# Patient Record
Sex: Female | Born: 1935 | Race: Asian | Hispanic: No | Marital: Married | Smoking: Never smoker
Health system: Southern US, Community
[De-identification: ages and names within clinical notes are randomized; demographics above are authoritative.]

## PROBLEM LIST (undated history)

## (undated) DIAGNOSIS — E785 Hyperlipidemia, unspecified: Secondary | ICD-10-CM

## (undated) DIAGNOSIS — I2510 Atherosclerotic heart disease of native coronary artery without angina pectoris: Secondary | ICD-10-CM

## (undated) DIAGNOSIS — I5032 Chronic diastolic (congestive) heart failure: Secondary | ICD-10-CM

## (undated) DIAGNOSIS — E669 Obesity, unspecified: Secondary | ICD-10-CM

## (undated) DIAGNOSIS — Z889 Allergy status to unspecified drugs, medicaments and biological substances status: Secondary | ICD-10-CM

## (undated) DIAGNOSIS — Z8673 Personal history of transient ischemic attack (TIA), and cerebral infarction without residual deficits: Secondary | ICD-10-CM

## (undated) DIAGNOSIS — E119 Type 2 diabetes mellitus without complications: Secondary | ICD-10-CM

## (undated) DIAGNOSIS — I214 Non-ST elevation (NSTEMI) myocardial infarction: Secondary | ICD-10-CM

## (undated) DIAGNOSIS — N183 Chronic kidney disease, stage 3 unspecified: Secondary | ICD-10-CM

## (undated) DIAGNOSIS — Z9861 Coronary angioplasty status: Secondary | ICD-10-CM

## (undated) DIAGNOSIS — I10 Essential (primary) hypertension: Secondary | ICD-10-CM

## (undated) DIAGNOSIS — Z794 Long term (current) use of insulin: Secondary | ICD-10-CM

## (undated) DIAGNOSIS — M1990 Unspecified osteoarthritis, unspecified site: Secondary | ICD-10-CM

## (undated) DIAGNOSIS — I6930 Unspecified sequelae of cerebral infarction: Secondary | ICD-10-CM

## (undated) HISTORY — DX: Unspecified osteoarthritis, unspecified site: M19.90

## (undated) HISTORY — DX: Hyperlipidemia, unspecified: E78.5

## (undated) HISTORY — DX: Chronic diastolic (congestive) heart failure: I50.32

## (undated) HISTORY — DX: Type 2 diabetes mellitus without complications: Z79.4

## (undated) HISTORY — DX: Unspecified sequelae of cerebral infarction: I69.30

## (undated) HISTORY — DX: Personal history of transient ischemic attack (TIA), and cerebral infarction without residual deficits: Z86.73

## (undated) HISTORY — DX: Coronary angioplasty status: Z98.61

## (undated) HISTORY — DX: Non-ST elevation (NSTEMI) myocardial infarction: I21.4

## (undated) HISTORY — DX: Type 2 diabetes mellitus without complications: E11.9

## (undated) HISTORY — PX: CHOLECYSTECTOMY: SHX55

## (undated) HISTORY — DX: Obesity, unspecified: E66.9

## (undated) HISTORY — PX: CAROTID STENT: SHX1301

## (undated) HISTORY — DX: Atherosclerotic heart disease of native coronary artery without angina pectoris: I25.10

## (undated) HISTORY — DX: Chronic kidney disease, stage 3 (moderate): N18.3

## (undated) HISTORY — DX: Chronic kidney disease, stage 3 unspecified: N18.30

---

## 2004-03-14 DIAGNOSIS — Z8673 Personal history of transient ischemic attack (TIA), and cerebral infarction without residual deficits: Secondary | ICD-10-CM

## 2005-03-03 ENCOUNTER — Inpatient Hospital Stay (HOSPITAL_COMMUNITY): Admission: EM | Admit: 2005-03-03 | Discharge: 2005-03-05 | Payer: Self-pay | Admitting: Emergency Medicine

## 2005-03-04 ENCOUNTER — Ambulatory Visit: Payer: Self-pay | Admitting: Internal Medicine

## 2005-03-13 ENCOUNTER — Ambulatory Visit (HOSPITAL_COMMUNITY): Admission: RE | Admit: 2005-03-13 | Discharge: 2005-03-13 | Payer: Self-pay | Admitting: *Deleted

## 2005-03-13 ENCOUNTER — Ambulatory Visit: Payer: Self-pay | Admitting: Internal Medicine

## 2005-03-13 ENCOUNTER — Encounter: Payer: Self-pay | Admitting: Cardiovascular Disease

## 2005-03-13 ENCOUNTER — Ambulatory Visit: Payer: Self-pay | Admitting: Cardiovascular Disease

## 2005-03-21 ENCOUNTER — Ambulatory Visit: Payer: Self-pay | Admitting: Internal Medicine

## 2005-03-22 ENCOUNTER — Ambulatory Visit: Payer: Self-pay | Admitting: Pulmonary Disease

## 2005-04-19 ENCOUNTER — Ambulatory Visit: Payer: Self-pay | Admitting: Internal Medicine

## 2009-11-07 ENCOUNTER — Inpatient Hospital Stay (HOSPITAL_COMMUNITY): Admission: EM | Admit: 2009-11-07 | Discharge: 2009-11-09 | Payer: Self-pay | Admitting: Emergency Medicine

## 2009-11-07 DIAGNOSIS — Z8673 Personal history of transient ischemic attack (TIA), and cerebral infarction without residual deficits: Secondary | ICD-10-CM

## 2009-11-07 HISTORY — DX: Personal history of transient ischemic attack (TIA), and cerebral infarction without residual deficits: Z86.73

## 2009-11-08 ENCOUNTER — Encounter (INDEPENDENT_AMBULATORY_CARE_PROVIDER_SITE_OTHER): Payer: Self-pay | Admitting: Internal Medicine

## 2009-11-08 ENCOUNTER — Ambulatory Visit: Payer: Self-pay | Admitting: Vascular Surgery

## 2010-06-23 LAB — URINE CULTURE
Colony Count: >=100000
Culture  Setup Time: 201108020044

## 2010-06-23 LAB — GLUCOSE, CAPILLARY
Glucose-Capillary: 141 mg/dL — ABNORMAL HIGH (ref 70–99)
Glucose-Capillary: 233 mg/dL — ABNORMAL HIGH (ref 70–99)
Glucose-Capillary: 266 mg/dL — ABNORMAL HIGH (ref 70–99)
Glucose-Capillary: 272 mg/dL — ABNORMAL HIGH (ref 70–99)
Glucose-Capillary: 293 mg/dL — ABNORMAL HIGH (ref 70–99)
Glucose-Capillary: 301 mg/dL — ABNORMAL HIGH (ref 70–99)
Glucose-Capillary: 303 mg/dL — ABNORMAL HIGH (ref 70–99)
Glucose-Capillary: 82 mg/dL (ref 70–99)

## 2010-06-23 LAB — DIFFERENTIAL
Basophils Absolute: 0 10*3/uL (ref 0.0–0.1)
Basophils Relative: 0 % (ref 0–1)
Eosinophils Absolute: 0.1 10*3/uL (ref 0.0–0.7)
Eosinophils Relative: 1 % (ref 0–5)
Lymphocytes Relative: 26 % (ref 12–46)
Lymphs Abs: 2.6 10*3/uL (ref 0.7–4.0)
Monocytes Absolute: 0.6 10*3/uL (ref 0.1–1.0)
Monocytes Relative: 6 % (ref 3–12)
Neutro Abs: 6.5 10*3/uL (ref 1.7–7.7)
Neutrophils Relative %: 66 % (ref 43–77)

## 2010-06-23 LAB — POCT I-STAT, CHEM 8
BUN: 51 mg/dL — ABNORMAL HIGH (ref 6–23)
Calcium, Ion: 1.02 mmol/L — ABNORMAL LOW (ref 1.12–1.32)
Chloride: 104 mEq/L (ref 96–112)
Creatinine, Ser: 1.5 mg/dL — ABNORMAL HIGH (ref 0.4–1.2)
Glucose, Bld: 263 mg/dL — ABNORMAL HIGH (ref 70–99)
HCT: 44 % (ref 36.0–46.0)
Hemoglobin: 15 g/dL (ref 12.0–15.0)
Potassium: 5.4 mEq/L — ABNORMAL HIGH (ref 3.5–5.1)
Sodium: 137 mEq/L (ref 135–145)
TCO2: 31 mmol/L (ref 0–100)

## 2010-06-23 LAB — CBC
HCT: 39.5 % (ref 36.0–46.0)
Hemoglobin: 13.7 g/dL (ref 12.0–15.0)
MCH: 31.6 pg (ref 26.0–34.0)
MCHC: 34.7 g/dL (ref 30.0–36.0)
MCV: 91 fL (ref 78.0–100.0)
Platelets: 209 10*3/uL (ref 150–400)
RBC: 4.34 MIL/uL (ref 3.87–5.11)
RDW: 13 % (ref 11.5–15.5)
WBC: 9.8 10*3/uL (ref 4.0–10.5)

## 2010-06-23 LAB — COMPREHENSIVE METABOLIC PANEL
ALT: 59 U/L — ABNORMAL HIGH (ref 0–35)
AST: 54 U/L — ABNORMAL HIGH (ref 0–37)
Albumin: 3.8 g/dL (ref 3.5–5.2)
Alkaline Phosphatase: 76 U/L (ref 39–117)
BUN: 34 mg/dL — ABNORMAL HIGH (ref 6–23)
CO2: 28 mEq/L (ref 19–32)
Calcium: 8.9 mg/dL (ref 8.4–10.5)
Chloride: 102 mEq/L (ref 96–112)
Creatinine, Ser: 1.25 mg/dL — ABNORMAL HIGH (ref 0.4–1.2)
GFR calc Af Amer: 51 mL/min — ABNORMAL LOW (ref >60)
GFR calc non Af Amer: 42 mL/min — ABNORMAL LOW (ref >60)
Glucose, Bld: 299 mg/dL — ABNORMAL HIGH (ref 70–99)
Potassium: 4.4 mEq/L (ref 3.5–5.1)
Sodium: 139 mEq/L (ref 135–145)
Total Bilirubin: 1.2 mg/dL (ref 0.3–1.2)
Total Protein: 7.5 g/dL (ref 6.0–8.3)

## 2010-06-23 LAB — LIPID PANEL
Cholesterol: 232 mg/dL — ABNORMAL HIGH (ref 0–200)
HDL: 50 mg/dL (ref >39)
LDL Cholesterol: 136 mg/dL — ABNORMAL HIGH (ref 0–99)
Total CHOL/HDL Ratio: 4.6 RATIO

## 2010-06-23 LAB — URINE MICROSCOPIC-ADD ON

## 2010-06-23 LAB — BASIC METABOLIC PANEL
BUN: 31 mg/dL — ABNORMAL HIGH (ref 6–23)
BUN: 34 mg/dL — ABNORMAL HIGH (ref 6–23)
Calcium: 8.8 mg/dL (ref 8.4–10.5)
Chloride: 105 mEq/L (ref 96–112)
Creatinine, Ser: 1.12 mg/dL (ref 0.4–1.2)
GFR calc non Af Amer: 48 mL/min — ABNORMAL LOW (ref >60)
Glucose, Bld: 174 mg/dL — ABNORMAL HIGH (ref 70–99)
Glucose, Bld: 233 mg/dL — ABNORMAL HIGH (ref 70–99)
Potassium: 4 mEq/L (ref 3.5–5.1)

## 2010-06-23 LAB — CARDIAC PANEL(CRET KIN+CKTOT+MB+TROPI)
CK, MB: 2.8 ng/mL (ref 0.3–4.0)
CK, MB: 3 ng/mL (ref 0.3–4.0)
Relative Index: 1.3 (ref 0.0–2.5)
Relative Index: 2.2 (ref 0.0–2.5)
Total CK: 139 U/L (ref 7–177)
Troponin I: 0.02 ng/mL (ref 0.00–0.06)

## 2010-06-23 LAB — HEMOGLOBIN A1C
Hgb A1c MFr Bld: 9 % — ABNORMAL HIGH (ref <5.7)
Mean Plasma Glucose: 212 mg/dL — ABNORMAL HIGH (ref <117)

## 2010-06-23 LAB — URINALYSIS, ROUTINE W REFLEX MICROSCOPIC
Bilirubin Urine: NEGATIVE
Glucose, UA: 500 mg/dL — AB
Hgb urine dipstick: NEGATIVE
Ketones, ur: NEGATIVE mg/dL
Nitrite: NEGATIVE
Protein, ur: NEGATIVE mg/dL
Specific Gravity, Urine: 1.021 (ref 1.005–1.030)
Urobilinogen, UA: 0.2 mg/dL (ref 0.0–1.0)
pH: 5.5 (ref 5.0–8.0)

## 2010-06-23 LAB — POCT CARDIAC MARKERS
CKMB, poc: 1.5 ng/mL (ref 1.0–8.0)
Myoglobin, poc: 148 ng/mL (ref 12–200)
Troponin i, poc: <0.05 ng/mL (ref 0.00–0.09)

## 2010-06-23 LAB — APTT: aPTT: 34 seconds (ref 24–37)

## 2010-06-23 LAB — PROTIME-INR
INR: 1.02 (ref 0.00–1.49)
Prothrombin Time: 13.3 seconds (ref 11.6–15.2)

## 2010-08-25 NOTE — H&P (Signed)
NAMEGREIDY, LOGWOOD                   ACCOUNT NO.:  [id number]   MEDICAL RECORD NO.:  [id number]          PATIENT TYPE:  EMS   LOCATION:  MAJO                         FACILITY:  [facility]   PHYSICIAN:  Melissa L. Lovena Le, MD  DATE OF BIRTH:  Mar 23, 1935   DATE OF ADMISSION:  03/03/2005  DATE OF DISCHARGE:                                HISTORY & PHYSICAL   CHIEF COMPLAINT:  Cough with shortness of breath.   HISTORY OF PRESENT ILLNESS:  The patient is a 76 year old Asian female  status post a cold 2 weeks ago, who again presents with increasing  shortness of breath and wheezing for the last week. The patient has had  increasing edema and a headache times several months as well as body aches  that have been relieved by Motrin. Today the patient went to Urgent Care to  be seen because of her increasing shortness of breath and was sent to the  emergency room with the thought that she had congestive heart failure. The  patient does not speak English and therefore the interview was conducted  with her daughter as the Optometrist. In discussing her case with the patient  it seems that she did stop taking her Lasix for the past 2 days after going  down to [place]. She did note increasing edema. The patient has been  complaining of frontal headaches of unclear etiology with blurred vision but  no loss of vision for several months to weeks. She has had no fever, no  chills, but did have some vomiting this morning. The patient also has  noticed increasing growth of the hump on the back of her neck with worsening  shoulder, neck and back pain. The patient denies diarrhea, no melena, no  hematochezia. No dysuria. All other review of systems appear to be negative.   PAST MEDICAL HISTORY:  The patient's daughter states that she had increasing  depressive symptoms over the past couple of weeks to months because of some  personal issues. She has been complaining of frontal headache which has been  continuous  and non relieved with medication. She does have seasonal  allergies. She also is taking a [place] herb the contents of which is  unknown.  Diabetes, and Hypertension.   PAST SURGICAL HISTORY:  Gallbladder removal.   SOCIAL HISTORY:  She denies tobacco and ethanol use.   FAMILY HISTORY:  Mom was deceased in her 22's with no medical condition. Dad  was deceased with diabetes and hypertension.   ALLERGIES:  No known drug allergies.   MEDICATIONS:  (Obtained from [facility], phone number [phone]).  1.  Kay Ciel 10 mEq daily.  2.  Lasix 20 mg daily.  3.  Glucophage/Metformin 5/500 two tablets b.i.d.  4.  Lantus (units however are unclear). Initially they reported that she was      taking 70/30 at 50 units at night and 30 in the morning. However, the      pharmacy relates that she is taking Lantus.  5.  Hyzaar 50/12.5 one p.o. daily.  6.  Folic acid.  7.  Allegra 180 mg daily p.r.n.  8.  Norvasc 10 mg daily.  9.  Lipitor was discontinued back in July (at least not refilled).  10. Again, the patient has been taking a green herbal remedy the contents of      which is unknown.   PHYSICAL EXAMINATION:  VITAL SIGNS:  Temperature was 99.3 on admission, down  to 97.8, blood pressure 150/71, pulse 92, respirations 24 to 28. Saturation  89% on room air, 99% on 2 liters.  GENERAL:  This is a very pleasant [name] in no acute distress.  HEENT:  Normocephalic with a positive moon face and anicteric sclerae.  Pupils are equal, round and reactive to light. Extraocular muscles are  intact. Mucous membranes are moist.  NECK:  Supple, there is no JVD, no lymph nodes and no carotid bruits. No  thyromegaly.  CHEST:  Crackles and wheezes anteriorly at the end of expiration.  CARDIOVASCULAR:  Regular rate and rhythm, positive S1, S2, no S3, S4. Heart  sounds are distant with no murmurs, rubs, or gallops.  ABDOMEN:  Obese, nontender with a question of hepatomegaly. No splenomegaly  is  appreciated.  EXTREMITIES:  Show positive non pitting edema in both lower extremities. She  has 2+ pulses.  NEUROLOGIC:  She is nonfocal. Power is 5/5. Deep tendon reflexes are 2+.  Cranial nerves II-XII are intact.   LABORATORY DATA:  Sodium is 139, potassium 4.3, chloride 107, CO2 is 29, BUN  is 13, creatinine 0.8, glucose is 237. Her white count is 10.1 thousand,  hemoglobin 11.7, hematocrit 35.2, platelets of 283,000. Point of care  enzymes negative x1. BNP is 82.   X-ray shows right upper lobe densities, no infiltrate, no pulmonary edema.   ASSESSMENT/PLAN:  This is a 76 year old [name] with cough, wheezing,  and dyspnea. Also complaining of headaches for several months and increased  swelling. The patient has not been taking her Lasix which may explain  increased edema, but we will need further work up to be sure that this is  not related to her other symptoms of a headache. The patient does have a  possible constellation of symptoms consistent with Cushing's disease, namely  headache, buffalo hump, and moon facies.   PROBLEM 1. Cardiovascular. No chest pain, paroxysmal nocturnal dyspnea or  orthopnea. She has a history of hypertension with positive peripheral edema  without evidence for congestive heart failure. We will check an  echocardiogram to assess ejection fraction and valvular disease as well as  her right ventricle. Will continue her Norvasc and continue Lasix at IV  q.12h. I will hold her hydrochlorothiazide.   PROBLEM 2.  She does have some lung nodules of question. She has no history  of tuberculosis or tuberculosis exposure. Will plan to do a chest CT. Use  nebulizers, a flutter valve and antibiotics at this time. Will use Avelox  400 mg IV daily and convert it to oral in the next day or so. Will use  supplemental oxygen.   PROBLEM 3. GI. She has increasing abdominal girth of unclear etiology. Will check her liver function tests, do an ultrasound of her  liver, CT of her  abdomen when she has her pulmonary/chest CT done in order to assess her  adrenals. We will not need oral contrast at this time.   PROBLEM 4.  GU. Will check an ultrasound of her kidneys and complete  abdominal ultrasound to assess the increasing abdominal girth. Need to check  a urinalysis  and C&S to assess for infection as well as proteinuria.   PROBLEM 5.  Endocrine. The patient does appear to have a constellation of  symptoms consistent with Cushing's, buffalo hump, moon facies and headaches.  Will check a CT of head to rule out adenoma, and check a 24 hour urine.  Cortisol, etc. For diabetes we need to check on her Lantus dosing. If it is  unclear what her true dosing is, for now I  will continue Lantus at 10 q.h.s., sliding scale Insulin at resistant  levels, and Glyburide. I will hold her Metformin because of the CT of the  chest and resume that in 2 days. \   PROBLEM 6.  Deep venous thrombosis prophylaxis with Lovenox.      Melissa L. Lovena Le, MD  Electronically Signed     MLT/MEDQ  D:  03/03/2005  T:  03/03/2005  Job:  [id number]   cc:   Jacelyn Pi, M.D.  Fax: [phone]

## 2010-08-25 NOTE — Consult Note (Signed)
Danielle [name], Danielle [name]                   ACCOUNT NO.:  [id number]   MEDICAL RECORD NO.:  [id number]          PATIENT TYPE:  INP   LOCATION:  5731                         FACILITY:  [facility]   PHYSICIAN:  Legrand Como B. Melvyn Novas, M.D. [facility] OF BIRTH:  [birth year]   DATE OF CONSULTATION:  03/04/2005  DATE OF DISCHARGE:                                   CONSULTATION   CHIEF COMPLAINT:  Cough.   HISTORY:  Exceptionally challenging 76 year old [name] who has lived  in this country for [place] years but does not speak a word of [place].  She  reports multiple aches and pains all over her body, neck, shoulders, and  head for months if not years, already evaluated she says by her doctor at  [facility], who she cannot name, which included a chest x-ray.  She came in with increasing symptoms of new dry cough and dyspnea over the  last two weeks and feels 100% better after receiving prednisone overnight  last night.  She also reported increasing fluid retention and was felt to  have possible heart failure on x-ray and by history and given Lasix and  feels all better now including resolution of edema.   She denies any sputum production, documented fever although feels feverish  with this illness.  She denies any soaking sweats, excess sputum production,  active sinus or reflux symptoms.  She denies any exertional chest pain or  classic pleuritic pain.   PAST MEDICAL HISTORY:  Significant for:  1.  Depression.  2.  Headache.  3.  Seasonal allergies.  4.  Diabetes.  5.  Hypertension.  6.  Remote gallbladder surgery.   SOCIAL HISTORY:  She had never smoked.  Denies any alcohol use.   FAMILY HISTORY:  Negative for respiratory diseases or rheumatologic diseases  to her knowledge.   ALLERGIES:  None known.   MEDICATIONS AT HOME:  Potassium, Lasix, glyburide/metformin, Lantus, Hyzaar,  folic acid, Allegra, Norvasc, and Lipitor although note that she is not  consistent with any of these  medicines.   REVIEW OF SYSTEMS:  Taken in detail as much as possible given the language  barrier and are negative except as outlined above.   PHYSICAL EXAMINATION:   REVIEW OF SYSTEMS:  GENERAL:  To correct the present illness, she has  apparently been losing weight; unclear of the amount and admits that she, in  retrospect she has not her appetite for at least a few weeks.   PHYSICAL EXAMINATION:  GENERAL:  This is a pleasant, [name]  surrounded by perhaps 10 family members, in no acute distress on oxygen.  VITAL SIGNS:  She is afebrile.  Normal vital signs.  She does have slightly  moon facies.  HEENT:  Other unremarkable.  Pharynx clear.  Dentition is intact.  NECK:  Supple without cervical adenopathy or tenderness.  Trachea is  midline.  LUNGS:  Lung fields reveal a few inspiratory pops and squeaks, minimal  expiratory rhonchi.  Overall air movement was adequate.  HEART:  Regular rate and rhythm without murmur, gallop or rub.  No increase  in PT or displacement of PMI.  ABDOMEN:  Obese benign with no palpable organomegaly, masses or tenderness.  EXTREMITIES:  Warm without calf tenderness or cyanosis or clubbing.  There  was no pitting edema today although she was noted to have pitting edema on  admission.  No obvious rash or joint abnormalities.   LABORATORY DATA:  Sed rate of 65 with a BNP of 82.  CBC was unremarkable  with no eosinophil count noted.  Urinalysis was unremarkable.  Chest x-ray and CT scan suggest bilateral interstitial disease, most severe  in the left upper lobe with nodular features.   IMPRESSION:  Atypical pulmonary infiltrates which may or may not have  anything to do with her acute illness of cough associated with complaint of  increased swelling but note the absence of evidence of heart failure by  BNP or exam.  On __________, she is feeling better after a single dose of  Lasix, albeit with steroids.   Given the language barrier, I do not  believe I am going to be able to narrow  the differential diagnosis at this point with the differential diagnoses  including rheumatologic lung disease, MAI, malignancy, but being very  unlikely that it could be related to either typical tuberculosis or acute  pneumonia.   Her aching all over for months that is better with steroids is strongly  suggestive of collagen vascular disorder especially polymyalgia rheumatica  (which does not usually involve the lung like this), rheumatoid arthritis  lupus.  We need to screen for these as well as eosinophilic lung disease.  Eosinophil count is pending but note the patient is already on steroids.   Most important aspect of this patient will be following her serially and  deciding whether a transbronchial biopsy or open lung biopsy be necessary  depending on the results of the above studies.  Plus looking at old records  including x-rays when available.  She has had multiple work-up over the last  year for her aching all over.  I will be interested to see what shows up.   RECOMMENDATIONS:  1.  In terms of discharge planning, she is already feeling so much better      that I suspect she is adequately saturated on room air or soon will be      and can be followed as an outpatient.  2.  In terms of her complaints of swelling all over, I would stop Norvasc      since it tends to cause fluid retention and treat her aggressively with      diuretics and ARB therapy, but avoid ACE inhibitors since her chief      complaint at this point is a dry cough.   Followup will be in the office within a week of discharge with all records  and x-rays when available          ______________________________  Christena Deem. Melvyn Novas, M.D. [facility]    MBW/MEDQ  D:  03/04/2005  T:  03/05/2005  Job:  [id number]   cc:   Sadie Haber at [employer] site

## 2010-09-20 ENCOUNTER — Ambulatory Visit (INDEPENDENT_AMBULATORY_CARE_PROVIDER_SITE_OTHER): Payer: Medicare Other | Admitting: Family Medicine

## 2010-09-20 ENCOUNTER — Encounter: Payer: Self-pay | Admitting: Family Medicine

## 2010-09-20 VITALS — BP 123/73 | Ht <= 58 in | Wt 180.0 lb

## 2010-09-20 DIAGNOSIS — M25561 Pain in right knee: Secondary | ICD-10-CM

## 2010-09-20 DIAGNOSIS — M1710 Unilateral primary osteoarthritis, unspecified knee: Secondary | ICD-10-CM

## 2010-09-20 DIAGNOSIS — M179 Osteoarthritis of knee, unspecified: Secondary | ICD-10-CM | POA: Insufficient documentation

## 2010-09-20 DIAGNOSIS — M25562 Pain in left knee: Secondary | ICD-10-CM | POA: Insufficient documentation

## 2010-09-20 DIAGNOSIS — M25569 Pain in unspecified knee: Secondary | ICD-10-CM

## 2010-09-20 NOTE — Progress Notes (Signed)
  Subjective:    Patient ID: Danielle [name], female    DOB: [birth year], 76 y.o.   MRN: [id number]  HPI 76 year old new patient [name] here for evaluation of left greater than right knee pain. She is the mother of a woman who is friends with [place].  Patient does not speak any illicit, but her daughter speaks good English and her husband who is with her speaks some period she has had gradually increasing left greater than right knee pain for the last 9 months. It is dull aching quality that is worse with activity and better with rest. She also gets an occasional weakness and mild instability. She was given a trial of a patellofemoral brace, but she has much difficulty using this. She had some x-rays done in [place] within the past year that she is brought for me to review. She'll occasionally get some swelling. Currently she is doing Celebrex and some sort of topical ointment.  PMH: DM, HTN Social: [place], now living in [place]. Non smoker  Review of Systems Denies fever, sweats, chills, weight loss. Positive for history of neuropathy because of her diabetes. Denies current chest pain the    Objective:   Physical Exam Gen. appearance: Overweight short [name] no distress Psych: Normal pleasant affect Neck: Supple ENT: Moist mucous membranes Left knee: Mild to moderate medial joint line tenderness to palpation. Range of motion 0-115. Positive crepitus. Ligaments are intact. Trace effusion. Right knee: Minimal medial joint line tenderness to palpation, but with mild crepitus. Range of motion 0-125. No effusion. Ligaments appear intact. Lower extremities: 1-2+ pitting edema bilaterally Feet: Fairly good foot structure without significant pes planus or pes cavus and no significant pronation with gait  Radiographs: 4 views of the bilateral knees were reviewed from a CVA. On her right knee she has medial compartment moderate DJD and moderate patellofemoral DJD with spurring. There is no obvious  evidence of infectious or malignancy. Left knee shows medial compartment moderate DJD as well as some moderate patellofemoral DJD, with multiple areas of spurring.       Assessment & Plan:  Left greater than right knee pain most consistent with degenerative joint disease -She declined a cortisone shot today -[facility] 2 times per week for 4 weeks -Continue Celebrex with food daily as needed. Also recommended Tylenol Extra Strength 4 times daily and trial of glucosamine. -We have her temporary green orthotics to help with cushion, she tried this prior to leaving and liked the way they felt -Also wrote prescriptions for a Velcro knee brace that'll be easier for her to handle as well as a fold up wheelchair that she can use for longer trips with her family -Followup in one month, if no better at that point can consider cortisone shot

## 2010-10-30 ENCOUNTER — Telehealth: Payer: Self-pay | Admitting: *Deleted

## 2010-10-30 NOTE — Telephone Encounter (Signed)
Message copied by Ocie Bob on Mon Oct 30, 2010  3:21 PM ------      Message from: CERESI, [place] L      Created: Mon Oct 30, 2010  8:17 AM      Regarding: pt request      Contact: [phone]       Per Juliann Pulse ([facility]) left a message stating she needs an order to continue PT through the 2nd week of August at 2 visits per week.  Please call her with any questions and the verbal order.

## 2010-10-30 NOTE — Telephone Encounter (Signed)
Authorized 2 visits per week through 2nd weak of August.  Per [facility] patient is making progress, they are working on HEP for after d/c and trying to set up times with another agency to see patient in the home after [facility] PT has d/c'd patient.

## 2011-02-08 DIAGNOSIS — I2510 Atherosclerotic heart disease of native coronary artery without angina pectoris: Secondary | ICD-10-CM

## 2011-02-08 HISTORY — DX: Atherosclerotic heart disease of native coronary artery without angina pectoris: I25.10

## 2011-03-06 ENCOUNTER — Other Ambulatory Visit: Payer: Self-pay

## 2011-03-06 ENCOUNTER — Encounter (HOSPITAL_COMMUNITY): Payer: Self-pay | Admitting: Emergency Medicine

## 2011-03-06 ENCOUNTER — Inpatient Hospital Stay (HOSPITAL_COMMUNITY)
Admission: EM | Admit: 2011-03-06 | Discharge: 2011-03-17 | DRG: 246 | Disposition: A | Discharge status: Home Health | Payer: Medicare Other | Attending: Cardiology | Admitting: Cardiology

## 2011-03-06 ENCOUNTER — Emergency Department (HOSPITAL_COMMUNITY): Payer: Medicare Other

## 2011-03-06 DIAGNOSIS — I2510 Atherosclerotic heart disease of native coronary artery without angina pectoris: Secondary | ICD-10-CM | POA: Diagnosis present

## 2011-03-06 DIAGNOSIS — I129 Hypertensive chronic kidney disease with stage 1 through stage 4 chronic kidney disease, or unspecified chronic kidney disease: Secondary | ICD-10-CM | POA: Diagnosis present

## 2011-03-06 DIAGNOSIS — I214 Non-ST elevation (NSTEMI) myocardial infarction: Secondary | ICD-10-CM

## 2011-03-06 DIAGNOSIS — N183 Chronic kidney disease, stage 3 unspecified: Secondary | ICD-10-CM | POA: Diagnosis present

## 2011-03-06 DIAGNOSIS — I5033 Acute on chronic diastolic (congestive) heart failure: Secondary | ICD-10-CM | POA: Diagnosis present

## 2011-03-06 DIAGNOSIS — I10 Essential (primary) hypertension: Secondary | ICD-10-CM | POA: Diagnosis not present

## 2011-03-06 DIAGNOSIS — E119 Type 2 diabetes mellitus without complications: Secondary | ICD-10-CM | POA: Diagnosis present

## 2011-03-06 DIAGNOSIS — E785 Hyperlipidemia, unspecified: Secondary | ICD-10-CM | POA: Diagnosis present

## 2011-03-06 DIAGNOSIS — N2889 Other specified disorders of kidney and ureter: Secondary | ICD-10-CM | POA: Diagnosis present

## 2011-03-06 DIAGNOSIS — I639 Cerebral infarction, unspecified: Secondary | ICD-10-CM

## 2011-03-06 DIAGNOSIS — Z9861 Coronary angioplasty status: Secondary | ICD-10-CM | POA: Diagnosis present

## 2011-03-06 DIAGNOSIS — E876 Hypokalemia: Secondary | ICD-10-CM | POA: Diagnosis not present

## 2011-03-06 DIAGNOSIS — J9600 Acute respiratory failure, unspecified whether with hypoxia or hypercapnia: Secondary | ICD-10-CM | POA: Diagnosis not present

## 2011-03-06 DIAGNOSIS — K5900 Constipation, unspecified: Secondary | ICD-10-CM | POA: Diagnosis present

## 2011-03-06 DIAGNOSIS — J189 Pneumonia, unspecified organism: Secondary | ICD-10-CM | POA: Diagnosis present

## 2011-03-06 DIAGNOSIS — J9691 Respiratory failure, unspecified with hypoxia: Secondary | ICD-10-CM | POA: Diagnosis present

## 2011-03-06 DIAGNOSIS — M1710 Unilateral primary osteoarthritis, unspecified knee: Secondary | ICD-10-CM | POA: Diagnosis present

## 2011-03-06 DIAGNOSIS — I5032 Chronic diastolic (congestive) heart failure: Secondary | ICD-10-CM | POA: Diagnosis present

## 2011-03-06 DIAGNOSIS — Z8673 Personal history of transient ischemic attack (TIA), and cerebral infarction without residual deficits: Secondary | ICD-10-CM | POA: Diagnosis present

## 2011-03-06 DIAGNOSIS — I509 Heart failure, unspecified: Secondary | ICD-10-CM

## 2011-03-06 DIAGNOSIS — N390 Urinary tract infection, site not specified: Secondary | ICD-10-CM | POA: Clinically undetermined

## 2011-03-06 DIAGNOSIS — E669 Obesity, unspecified: Secondary | ICD-10-CM | POA: Diagnosis present

## 2011-03-06 DIAGNOSIS — I739 Peripheral vascular disease, unspecified: Secondary | ICD-10-CM | POA: Diagnosis present

## 2011-03-06 HISTORY — DX: Allergy status to unspecified drugs, medicaments and biological substances: Z88.9

## 2011-03-06 HISTORY — DX: Essential (primary) hypertension: I10

## 2011-03-06 HISTORY — DX: Non-ST elevation (NSTEMI) myocardial infarction: I21.4

## 2011-03-06 LAB — CBC
HCT: 30.2 % — ABNORMAL LOW (ref 36.0–46.0)
Hemoglobin: 11 g/dL — ABNORMAL LOW (ref 12.0–15.0)
Hemoglobin: 9.5 g/dL — ABNORMAL LOW (ref 12.0–15.0)
MCH: 30.6 pg (ref 26.0–34.0)
MCH: 29.8 pg (ref 26.0–34.0)
MCHC: 32.3 g/dL (ref 30.0–36.0)
MCHC: 31.5 g/dL (ref 30.0–36.0)
MCV: 94.7 fL (ref 78.0–100.0)
Platelets: 146 10*3/uL — ABNORMAL LOW (ref 150–400)
Platelets: 145 10*3/uL — ABNORMAL LOW (ref 150–400)
RBC: 3.59 MIL/uL — ABNORMAL LOW (ref 3.87–5.11)
RBC: 3.19 MIL/uL — ABNORMAL LOW (ref 3.87–5.11)
RDW: 13 % (ref 11.5–15.5)
WBC: 15.9 10*3/uL — ABNORMAL HIGH (ref 4.0–10.5)

## 2011-03-06 LAB — URINE MICROSCOPIC-ADD ON

## 2011-03-06 LAB — DIFFERENTIAL
Basophils Absolute: 0 10*3/uL (ref 0.0–0.1)
Basophils Relative: 0 % (ref 0–1)
Basophils Relative: 0 % (ref 0–1)
Eosinophils Absolute: 0 10*3/uL (ref 0.0–0.7)
Eosinophils Absolute: 0 10*3/uL (ref 0.0–0.7)
Eosinophils Relative: 0 % (ref 0–5)
Lymphocytes Relative: 8 % — ABNORMAL LOW (ref 12–46)
Lymphs Abs: 1.4 10*3/uL (ref 0.7–4.0)
Lymphs Abs: 1.3 10*3/uL (ref 0.7–4.0)
Monocytes Absolute: 0.6 10*3/uL (ref 0.1–1.0)
Monocytes Relative: 3 % (ref 3–12)
Monocytes Relative: 4 % (ref 3–12)
Neutro Abs: 15.8 10*3/uL — ABNORMAL HIGH (ref 1.7–7.7)
Neutro Abs: 14 10*3/uL — ABNORMAL HIGH (ref 1.7–7.7)
Neutrophils Relative %: 89 % — ABNORMAL HIGH (ref 43–77)
Neutrophils Relative %: 88 % — ABNORMAL HIGH (ref 43–77)

## 2011-03-06 LAB — COMPREHENSIVE METABOLIC PANEL
ALT: 16 U/L (ref 0–35)
AST: 57 U/L — ABNORMAL HIGH (ref 0–37)
Albumin: 3.3 g/dL — ABNORMAL LOW (ref 3.5–5.2)
Alkaline Phosphatase: 71 U/L (ref 39–117)
BUN: 23 mg/dL (ref 6–23)
CO2: 25 mEq/L (ref 19–32)
Calcium: 8.5 mg/dL (ref 8.4–10.5)
Chloride: 101 mEq/L (ref 96–112)
Creatinine, Ser: 1.44 mg/dL — ABNORMAL HIGH (ref 0.50–1.10)
GFR calc Af Amer: 40 mL/min — ABNORMAL LOW (ref >90)
GFR calc non Af Amer: 35 mL/min — ABNORMAL LOW (ref >90)
Glucose, Bld: 227 mg/dL — ABNORMAL HIGH (ref 70–99)
Potassium: 4.1 mEq/L (ref 3.5–5.1)
Sodium: 138 mEq/L (ref 135–145)
Total Bilirubin: 0.9 mg/dL (ref 0.3–1.2)
Total Protein: 7 g/dL (ref 6.0–8.3)

## 2011-03-06 LAB — POCT I-STAT, CHEM 8
BUN: 23 mg/dL (ref 6–23)
Calcium, Ion: 1.16 mmol/L (ref 1.12–1.32)
Creatinine, Ser: 1.5 mg/dL — ABNORMAL HIGH (ref 0.50–1.10)
Glucose, Bld: 178 mg/dL — ABNORMAL HIGH (ref 70–99)
Hemoglobin: 11.9 g/dL — ABNORMAL LOW (ref 12.0–15.0)
Sodium: 144 mEq/L (ref 135–145)
TCO2: 22 mmol/L (ref 0–100)

## 2011-03-06 LAB — GLUCOSE, CAPILLARY

## 2011-03-06 LAB — BLOOD GAS, ARTERIAL
Acid-Base Excess: 0.1 mmol/L (ref 0.0–2.0)
Drawn by: 244901
FIO2: 1 %
O2 Saturation: 98 %

## 2011-03-06 LAB — URINALYSIS, ROUTINE W REFLEX MICROSCOPIC
Glucose, UA: NEGATIVE mg/dL
Hgb urine dipstick: NEGATIVE
pH: 8.5 — ABNORMAL HIGH (ref 5.0–8.0)

## 2011-03-06 LAB — HEPARIN LEVEL (UNFRACTIONATED)
Heparin Unfractionated: >2 IU/mL — ABNORMAL HIGH (ref 0.30–0.70)
Heparin Unfractionated: 0.44 IU/mL (ref 0.30–0.70)

## 2011-03-06 LAB — CARDIAC PANEL(CRET KIN+CKTOT+MB+TROPI)
CK, MB: 12.9 ng/mL (ref 0.3–4.0)
CK, MB: 41.6 ng/mL (ref 0.3–4.0)
Relative Index: 7.1 — ABNORMAL HIGH (ref 0.0–2.5)
Total CK: 265 U/L — ABNORMAL HIGH (ref 7–177)
Total CK: 582 U/L — ABNORMAL HIGH (ref 7–177)
Troponin I: 1.62 ng/mL (ref <0.30)
Troponin I: 16.55 ng/mL (ref <0.30)

## 2011-03-06 LAB — MAGNESIUM: Magnesium: 1.7 mg/dL (ref 1.5–2.5)

## 2011-03-06 LAB — PROTIME-INR
INR: 1.25 (ref 0.00–1.49)
Prothrombin Time: 14.4 seconds (ref 11.6–15.2)
Prothrombin Time: 16 seconds — ABNORMAL HIGH (ref 11.6–15.2)

## 2011-03-06 LAB — POCT I-STAT TROPONIN I

## 2011-03-06 MED ORDER — HEPARIN BOLUS VIA INFUSION: 4000.0000 [IU] | Freq: Once | INTRAVENOUS | Status: DC

## 2011-03-06 MED ORDER — OLMESARTAN MEDOXOMIL 20 MG PO TABS
20.0000 mg | ORAL_TABLET | Freq: Every day | ORAL | Status: DC
  Administered 2011-03-06 – 2011-03-08 (×3): 20 mg via ORAL
  Filled 2011-03-06 (×3): qty 1

## 2011-03-06 MED ORDER — DOCUSATE SODIUM 100 MG PO CAPS
100.0000 mg | ORAL_CAPSULE | Freq: Two times a day (BID) | ORAL | Status: DC | PRN
  Administered 2011-03-08: 100 mg via ORAL
  Filled 2011-03-06: qty 1

## 2011-03-06 MED ORDER — SIMVASTATIN 20 MG PO TABS
20.0000 mg | ORAL_TABLET | Freq: Every day | ORAL | Status: DC
  Administered 2011-03-06 – 2011-03-16 (×12): 20 mg via ORAL
  Filled 2011-03-06 (×13): qty 1

## 2011-03-06 MED ORDER — INSULIN ASPART 100 UNIT/ML ~~LOC~~ SOLN
0.0000 [IU] | Freq: Every day | SUBCUTANEOUS | Status: DC
  Administered 2011-03-06: 2 [IU] via SUBCUTANEOUS
  Administered 2011-03-07: 0 [IU] via SUBCUTANEOUS
  Administered 2011-03-11: 4 [IU] via SUBCUTANEOUS
  Administered 2011-03-12: 3 [IU] via SUBCUTANEOUS

## 2011-03-06 MED ORDER — LIDOCAINE HCL 1 % IJ SOLN
INTRAMUSCULAR | Status: AC
  Administered 2011-03-06: 2.1 mL via INTRAMUSCULAR
  Filled 2011-03-06: qty 20

## 2011-03-06 MED ORDER — INSULIN ASPART 100 UNIT/ML ~~LOC~~ SOLN
0.0000 [IU] | Freq: Three times a day (TID) | SUBCUTANEOUS | Status: DC
  Administered 2011-03-07: 2 [IU] via SUBCUTANEOUS
  Administered 2011-03-07: 3 [IU] via SUBCUTANEOUS
  Administered 2011-03-07 – 2011-03-08 (×2): 5 [IU] via SUBCUTANEOUS
  Administered 2011-03-08: 8 [IU] via SUBCUTANEOUS
  Administered 2011-03-08: 5 [IU] via SUBCUTANEOUS
  Administered 2011-03-09 (×2): 3 [IU] via SUBCUTANEOUS
  Administered 2011-03-09: 8 [IU] via SUBCUTANEOUS
  Administered 2011-03-10: 7 [IU] via SUBCUTANEOUS
  Administered 2011-03-10: 5 [IU] via SUBCUTANEOUS
  Administered 2011-03-10: 3 [IU] via SUBCUTANEOUS
  Administered 2011-03-11: 7 [IU] via SUBCUTANEOUS
  Administered 2011-03-11 (×2): 5 [IU] via SUBCUTANEOUS
  Filled 2011-03-06 (×2): qty 3

## 2011-03-06 MED ORDER — HEPARIN BOLUS VIA INFUSION
4000.0000 [IU] | Freq: Once | INTRAVENOUS | Status: AC
  Administered 2011-03-06: 4000 [IU] via INTRAVENOUS
  Filled 2011-03-06: qty 4000

## 2011-03-06 MED ORDER — NITROGLYCERIN IN D5W 200-5 MCG/ML-% IV SOLN
INTRAVENOUS | Status: AC
  Administered 2011-03-06: 50000 ug via INTRAVENOUS
  Filled 2011-03-06: qty 250

## 2011-03-06 MED ORDER — LEVALBUTEROL HCL 0.63 MG/3ML IN NEBU
0.6300 mg | INHALATION_SOLUTION | RESPIRATORY_TRACT | Status: DC | PRN
  Filled 2011-03-06: qty 3

## 2011-03-06 MED ORDER — FUROSEMIDE 10 MG/ML IJ SOLN
40.0000 mg | Freq: Once | INTRAMUSCULAR | Status: AC
  Administered 2011-03-06: 40 mg via INTRAVENOUS
  Filled 2011-03-06: qty 4

## 2011-03-06 MED ORDER — METOPROLOL TARTRATE 12.5 MG HALF TABLET
12.5000 mg | ORAL_TABLET | Freq: Two times a day (BID) | ORAL | Status: DC
  Administered 2011-03-06 – 2011-03-17 (×22): 12.5 mg via ORAL
  Filled 2011-03-06 (×23): qty 1

## 2011-03-06 MED ORDER — MORPHINE SULFATE 4 MG/ML IJ SOLN
4.0000 mg | INTRAMUSCULAR | Status: DC | PRN
Start: 2011-03-06 — End: 2011-03-08
  Administered 2011-03-06: 2 mg via INTRAVENOUS
  Administered 2011-03-08: 4 mg via INTRAVENOUS
  Filled 2011-03-06 (×2): qty 1

## 2011-03-06 MED ORDER — HEPARIN SOD (PORCINE) IN D5W 100 UNIT/ML IV SOLN
1000.0000 [IU]/h | INTRAVENOUS | Status: DC
  Administered 2011-03-06 – 2011-03-10 (×5): 1000 [IU]/h via INTRAVENOUS
  Filled 2011-03-06 (×8): qty 250

## 2011-03-06 MED ORDER — ZOLPIDEM TARTRATE 5 MG PO TABS: 5.0000 mg | ORAL_TABLET | Freq: Every evening | ORAL | Status: DC | PRN

## 2011-03-06 MED ORDER — NITROGLYCERIN 0.4 MG SL SUBL
0.4000 mg | SUBLINGUAL_TABLET | SUBLINGUAL | Status: DC | PRN
  Administered 2011-03-06: 0.4 mg via SUBLINGUAL

## 2011-03-06 MED ORDER — NITROGLYCERIN 0.4 MG SL SUBL: 0.4000 mg | SUBLINGUAL_TABLET | SUBLINGUAL | Status: DC | PRN

## 2011-03-06 MED ORDER — CLOPIDOGREL BISULFATE 75 MG PO TABS
75.0000 mg | ORAL_TABLET | Freq: Every day | ORAL | Status: DC
  Administered 2011-03-06 – 2011-03-12 (×7): 75 mg via ORAL
  Filled 2011-03-06 (×7): qty 1

## 2011-03-06 MED ORDER — HEPARIN (PORCINE) IN NACL 100-0.45 UNIT/ML-% IJ SOLN: 1000.0000 [IU]/h | INTRAMUSCULAR | Status: DC

## 2011-03-06 MED ORDER — ASPIRIN EC 81 MG PO TBEC
81.0000 mg | DELAYED_RELEASE_TABLET | Freq: Every day | ORAL | Status: DC
  Administered 2011-03-07 – 2011-03-12 (×6): 81 mg via ORAL
  Filled 2011-03-06 (×6): qty 1

## 2011-03-06 MED ORDER — ASPIRIN 81 MG PO CHEW: 324.0000 mg | CHEWABLE_TABLET | ORAL | Status: DC

## 2011-03-06 MED ORDER — MORPHINE SULFATE 2 MG/ML IJ SOLN
2.0000 mg | INTRAMUSCULAR | Status: DC | PRN
Start: 2011-03-06 — End: 2011-03-17
  Administered 2011-03-08: 2 mg via INTRAVENOUS
  Filled 2011-03-06: qty 1

## 2011-03-06 MED ORDER — SODIUM CHLORIDE 0.9 % IJ SOLN: 3.0000 mL | INTRAMUSCULAR | Status: DC | PRN

## 2011-03-06 MED ORDER — FUROSEMIDE 10 MG/ML IJ SOLN
80.0000 mg | Freq: Once | INTRAMUSCULAR | Status: AC
  Administered 2011-03-06: 80 mg via INTRAVENOUS
  Filled 2011-03-06: qty 8

## 2011-03-06 MED ORDER — CEFTRIAXONE SODIUM 1 G IJ SOLR
1.0000 g | INTRAMUSCULAR | Status: DC
  Administered 2011-03-06 – 2011-03-07 (×2): 1 g via INTRAMUSCULAR
  Filled 2011-03-06 (×2): qty 10

## 2011-03-06 MED ORDER — PANTOPRAZOLE SODIUM 40 MG IV SOLR
40.0000 mg | INTRAVENOUS | Status: DC
  Administered 2011-03-06 – 2011-03-08 (×3): 40 mg via INTRAVENOUS
  Filled 2011-03-06 (×4): qty 40

## 2011-03-06 MED ORDER — FUROSEMIDE 10 MG/ML IJ SOLN
80.0000 mg | Freq: Two times a day (BID) | INTRAMUSCULAR | Status: DC
  Administered 2011-03-06 – 2011-03-08 (×4): 80 mg via INTRAVENOUS
  Filled 2011-03-06 (×4): qty 8
  Filled 2011-03-06: qty 4
  Filled 2011-03-06 (×4): qty 8
  Filled 2011-03-06: qty 4

## 2011-03-06 MED ORDER — NITROGLYCERIN IN D5W 200-5 MCG/ML-% IV SOLN
2.0000 ug/min | INTRAVENOUS | Status: DC
  Administered 2011-03-06: 50000 ug via INTRAVENOUS
  Administered 2011-03-09: 10 ug/min via INTRAVENOUS
  Administered 2011-03-10: 20 ug/min via INTRAVENOUS
  Filled 2011-03-06 (×3): qty 250

## 2011-03-06 MED ORDER — FLUTICASONE PROPIONATE 50 MCG/ACT NA SUSP
1.0000 | Freq: Two times a day (BID) | NASAL | Status: DC | PRN
  Filled 2011-03-06: qty 16

## 2011-03-06 MED ORDER — ALPRAZOLAM 0.25 MG PO TABS
0.2500 mg | ORAL_TABLET | Freq: Two times a day (BID) | ORAL | Status: DC | PRN
  Administered 2011-03-07 – 2011-03-08 (×2): 0.25 mg via ORAL
  Filled 2011-03-06 (×2): qty 1

## 2011-03-06 MED ORDER — ACETAMINOPHEN 325 MG PO TABS
650.0000 mg | ORAL_TABLET | ORAL | Status: DC | PRN
Start: 2011-03-06 — End: 2011-03-17
  Filled 2011-03-06: qty 2
  Filled 2011-03-06: qty 1

## 2011-03-06 MED ORDER — AMLODIPINE BESYLATE 5 MG PO TABS
5.0000 mg | ORAL_TABLET | Freq: Every day | ORAL | Status: DC
  Administered 2011-03-06 – 2011-03-17 (×12): 5 mg via ORAL
  Filled 2011-03-06 (×12): qty 1

## 2011-03-06 MED ORDER — ASPIRIN 81 MG PO CHEW
324.0000 mg | CHEWABLE_TABLET | Freq: Once | ORAL | Status: AC
  Administered 2011-03-06: 324 mg via ORAL
  Filled 2011-03-06: qty 4

## 2011-03-06 MED ORDER — ONDANSETRON HCL 4 MG/2ML IJ SOLN
4.0000 mg | Freq: Four times a day (QID) | INTRAMUSCULAR | Status: DC | PRN
  Administered 2011-03-08 – 2011-03-10 (×2): 4 mg via INTRAVENOUS
  Filled 2011-03-06 (×3): qty 2

## 2011-03-06 NOTE — Progress Notes (Signed)
ANTICOAGULATION CONSULT NOTE - Initial Consult  Pharmacy Consult for Heparin Indication: chest pain/ACS Rule out non-STEMI  No Known Allergies  Patient Measurements: Height: 5\' 1"  (154.9 cm) Weight: 200 lb (90.719 kg) IBW/kg (Calculated) : 47.8  Adjusted Body Weight:   Vital Signs: Temp: 98.9 F (37.2 C) (11/27 1044) Temp src: Oral (11/27 1044) BP: 109/50 mmHg (11/27 1044) Pulse Rate: 97  (11/27 1044)  Labs:  [facility] 03/06/11 1047 03/06/11 1037  HGB 11.9* 11.0*  HCT 35.0* 34.1*  PLT -- 146*  APTT -- 36  LABPROT -- 14.4  INR -- 1.10  HEPARINUNFRC -- --  CREATININE 1.50* --  CKTOTAL -- 265*  CKMB -- 12.9*  TROPONINI -- 1.62*   Estimated Creatinine Clearance: 33.8 ml/min (by C-G formula based on Cr of 1.5).  Medical History: No past medical history on file.  Medications:  Scheduled:    . aspirin  324 mg Oral Once  . furosemide  80 mg Intravenous Once  . heparin  4,000 Units Intravenous Once  . nitroGLYCERIN      . DISCONTD: heparin  4,000 Units Intravenous Once   Infusions:    . heparin    . DISCONTD: heparin      Assessment: 76 yo female presented to ED with SOB, hx CHF, non-compliant with Furosemide.  Elevated Troponin 0.63, BNP 1233, baseline INR normal-no hx anti-coagulation.  Goal of Therapy:  Heparin level 0.3-0.7 units/ml   Plan:  Heparin bolus 4000 units/infusion at 1000 units/hr Heparin level in 8 hr and then daily. Daily CBC while on Heparin.  Nyoka Cowden, Elliot Meldrum L 03/06/2011,12:20 PM

## 2011-03-06 NOTE — ED Notes (Signed)
Daughters phone number [phone]. Spouse remains at bedside.

## 2011-03-06 NOTE — ED Notes (Signed)
Dr Karle Starch to room. Gave order for NTG to be resume current BP 114/82 pulse 98. Patient tolerating BiPap well. Spouse continues to sit at bedside.

## 2011-03-06 NOTE — ED Notes (Signed)
Pt to

## 2011-03-06 NOTE — H&P (Signed)
Patient ID: Danielle [name] MRN: [id number], DOB/AGE: 74-Oct-1936   Admit date: 03/06/2011   Primary Physician: Jacelyn Pi, MD, Dr Minna Antis Primary Cardiologist: Dr Ellyn Hack ,(new)    Problem List: Past Medical History  Diagnosis Date  . Diabetes mellitus   . Hypertension   . Stroke   . CHF (congestive heart failure)   . Arthritis   . Headache   . History of seasonal allergies     Past Surgical History  Procedure Date  . Cholecystectomy      Allergies: No Known Allergies  HPI:  Pt. Profile: 75 y/o [name] with no prior history of CAD or CHF, sent from Primary Care MD office with increasing SOB. On arrival to ED she was in respiratory distress with O2 sats in the 50s. She was treated with IV Lasix and BiPap with improvement. Her family says she has been suffering from URI for past few days. Last night symptoms progressed to orthopnea. There is no history from family of chest pain. The patient speaks no [place].  Home Medications  See Order history    History reviewed. No pertinent family history.   History   Social History  . Marital Status: Single    Spouse Name: N/A    Number of Children: N/A  . Years of Education: N/A   Occupational History  . Not on file.   Social History Main Topics  . Smoking status: Never Smoker   . Smokeless tobacco: Never Used  . Alcohol Use: No  . Drug Use: No  . Sexually Active: No   Other Topics Concern  . Not on file   Social History Narrative  . No narrative on file     Review of Systems: General: negative for chills, fever, night sweats or weight changes.  Cardiovascular: negative for chest pain, dyspnea on exertion, edema, orthopnea, palpitations, paroxysmal nocturnal dyspnea or shortness of breath Dermatological: negative for rash Respiratory: negative for cough or wheezing Urologic: negative for hematuria Abdominal: negative for nausea, vomiting, diarrhea, bright red blood per rectum, melena, or  hematemesis Neurologic: negative for visual changes, syncope, or dizziness All other systems reviewed and are otherwise negative except as noted above.  Physical Exam: Blood pressure 125/55, pulse 94, temperature 99.2 F (37.3 C), temperature source Oral, resp. rate 29, height 5\' 1"  (1.549 m), weight 90.719 kg (200 lb), SpO2 92.00%.  General: Well developed, well nourished, in no acute distress. Head: Normocephalic, atraumatic, sclera non-icteric, no xanthomas, nares are without discharge.  Neck: Supple without bruits or JVD. Lungs:  Resp regular and unlabored, CTA. Heart: RRR no s3, s4, or murmurs. Abdomen: Soft, non-tender, non-distended, BS + x 4.  Msk:  Strength and tone appears normal for age. Extremities: No clubbing, cyanosis or edema. DP/PT/Radials 2+ and equal bilaterally. Neuro: Alert and oriented X 3. Moves all extremities spontaneously. Psych: Normal affect.   Labs:   Results for orders placed during the hospital encounter of 03/06/11 (from the past 24 hour(s))  CBC     Status: Abnormal   Collection Time   03/06/11 10:37 AM      Component Value Range   WBC 17.8 (*) 4.0 - 10.5 (K/uL)   RBC 3.59 (*) 3.87 - 5.11 (MIL/uL)   Hemoglobin 11.0 (*) 12.0 - 15.0 (g/dL)   HCT 34.1 (*) 36.0 - 46.0 (%)   MCV 95.0  78.0 - 100.0 (fL)   MCH 30.6  26.0 - 34.0 (pg)   MCHC 32.3  30.0 - 36.0 (g/dL)   RDW  12.8  11.5 - 15.5 (%)   Platelets 146 (*) 150 - 400 (K/uL)  DIFFERENTIAL     Status: Abnormal   Collection Time   03/06/11 10:37 AM      Component Value Range   Neutrophils Relative 89 (*) 43 - 77 (%)   Neutro Abs 15.8 (*) 1.7 - 7.7 (K/uL)   Lymphocytes Relative 8 (*) 12 - 46 (%)   Lymphs Abs 1.4  0.7 - 4.0 (K/uL)   Monocytes Relative 3  3 - 12 (%)   Monocytes Absolute 0.6  0.1 - 1.0 (K/uL)   Eosinophils Relative 0  0 - 5 (%)   Eosinophils Absolute 0.0  0.0 - 0.7 (K/uL)   Basophils Relative 0  0 - 1 (%)   Basophils Absolute 0.0  0.0 - 0.1 (K/uL)  CARDIAC PANEL(CRET  KIN+CKTOT+MB+TROPI)     Status: Abnormal   Collection Time   03/06/11 10:37 AM      Component Value Range   Total CK 265 (*) 7 - 177 (U/L)   CK, MB 12.9 (*) 0.3 - 4.0 (ng/mL)   Troponin I 1.62 (*) <0.30 (ng/mL)   Relative Index 4.9 (*) 0.0 - 2.5   PRO B NATRIURETIC PEPTIDE     Status: Abnormal   Collection Time   03/06/11 10:37 AM      Component Value Range   BNP, POC 1233.0 (*) 0 - 125 (pg/mL)  PROTIME-INR     Status: Normal   Collection Time   03/06/11 10:37 AM      Component Value Range   Prothrombin Time 14.4  11.6 - 15.2 (seconds)   INR 1.10  0.00 - 1.49   APTT     Status: Normal   Collection Time   03/06/11 10:37 AM      Component Value Range   aPTT 36  24 - 37 (seconds)  URINALYSIS, ROUTINE W REFLEX MICROSCOPIC     Status: Abnormal   Collection Time   03/06/11 10:43 AM      Component Value Range   Color, Urine YELLOW  YELLOW    Appearance TURBID (*) CLEAR    Specific Gravity, Urine 1.018  1.005 - 1.030    pH 8.5 (*) 5.0 - 8.0    Glucose, UA NEGATIVE  NEGATIVE (mg/dL)   Hgb urine dipstick NEGATIVE  NEGATIVE    Bilirubin Urine NEGATIVE  NEGATIVE    Ketones, ur NEGATIVE  NEGATIVE (mg/dL)   Protein, ur 100 (*) NEGATIVE (mg/dL)   Urobilinogen, UA 0.2  0.0 - 1.0 (mg/dL)   Nitrite POSITIVE (*) NEGATIVE    Leukocytes, UA MODERATE (*) NEGATIVE   URINE MICROSCOPIC-ADD ON     Status: Abnormal   Collection Time   03/06/11 10:43 AM      Component Value Range   Squamous Epithelial / LPF FEW (*) RARE    WBC, UA 21-50  <3 (WBC/hpf)   Bacteria, UA MANY (*) RARE   POCT I-STAT TROPONIN I     Status: Abnormal   Collection Time   03/06/11 10:45 AM      Component Value Range   Troponin i, poc 0.63 (*) 0.00 - 0.08 (ng/mL)   Comment NOTIFIED PHYSICIAN     Comment 3       POC Troponin I      POCT I-STAT, CHEM 8     Status: Abnormal   Collection Time   03/06/11 10:47 AM      Component Value Range   Sodium 144  135 - 145 (mEq/L)   Potassium 4.2  3.5 - 5.1 (mEq/L)   Chloride  106  96 - 112 (mEq/L)   BUN 23  6 - 23 (mg/dL)   Creatinine, Ser 1.50 (*) 0.50 - 1.10 (mg/dL)   Glucose, Bld 178 (*) 70 - 99 (mg/dL)   Calcium, Ion 1.16  1.12 - 1.32 (mmol/L)   TCO2 22  0 - 100 (mmol/L)   Hemoglobin 11.9 (*) 12.0 - 15.0 (g/dL)   HCT 35.0 (*) 36.0 - 46.0 (%)     Radiology/Studies: Dg Chest Port 1 View  03/06/2011  *RADIOLOGY REPORT*  Clinical Data: Shortness of breath, CHF  PORTABLE CHEST - 1 VIEW  Comparison: 03/03/2005  Findings: Diffuse asymmetric patchy airspace disease versus edema, worse in the right lung.  Heart is enlarged.  Appearance is compatible with CHF however pneumonia not excluded. No large effusion or pneumothorax.  Atherosclerosis of the aorta and degenerative changes of the spine.  Trachea midline.  IMPRESSION:  Diffuse asymmetric airspace disease versus edema.  Original Report Authenticated By: Jerilynn Mages. Daryll Brod, M.D.    EKG:NSR without acute changes  ASSESSMENT AND PLAN:   1. NSTEMI by troponin (1.6) 2. Respiratory failure 3. Acute CHF 4. HTN 5. DM 6. Dyslipidemia 7. Hx of carotid stent in [place] Sept 2011 8. CVA Aug 2011 9. Morbid obesity, probable sleep apnea 10. Renal insuf. Stage 3,  (diuretics stopped about 6 wks ago)  Plan: Decrease Benicar, Lasix 80mg  IV BID, continue Heparin and NTG, add low dose beta blocker. Hold Glucophage, and put on sliding scale for now. Check 2D echo.   Henri Medal, NP 03/06/2011, 3:40 PM  I have seen and examined today (11/28) after being admitted over-night by Mr. Rosalyn Gess, [place].  I have reviewed the chart, notes and new data.  I agree with [facility]'s initial H&P note.  Events have occurred overnight since the initial evaluation, please see my follow-up progress note for impression & plan.  Danielle [name] presented with hypoxic respiratory failure - likely secondary to acute CHF (definitely diastolic, but not sure if systolic as we have no assessment of EF as yet).  She was initially stabilized in the ER, given  Lasix IV & started on IV Heparin & NTG for NSTEMI along with Abx for ? Bronchitis / PNA.   Late last PM-early AM, she decompensated again with respiratory distress & was placed on BiPAP, given lasix & morphine & seemed to do well overnight. Her Troponin level increased to ~16 & ECG is noted to have diffuse inferolateral ST segment depressions as well as (non-specific) ST elevation in aVR (that may suggest multivessel disease vs. LM disease). Unfortunately, she has chronic renal insufficiency making it a difficult decision re timing of cardiac catheterization.  I agree with the current plan of care.  See progress note for full details.  Leonie Man, M.D., M.S. [facility] [address]. [place], [place]  [address]  [phone]  03/07/2011 11:00 AM

## 2011-03-06 NOTE — ED Provider Notes (Signed)
History     CSN: [id number] Arrival date & time: 03/06/2011 10:12 AM   First MD Initiated Contact with Patient 03/06/11 1019      Chief Complaint  Patient presents with  . Respiratory Distress    (Consider location/radiation/quality/duration/timing/severity/associated sxs/prior treatment) HPI History limited by language barrier.  History provided by family at bedside. Pt has had several days of URI symptoms but over the last 24hrs has had increasingly severe SOB. No chest pain, but has had some cough. She went to her PCP office this morning and was sent immediately to the ED for eval of possible CHF. She has no known CAD or CHF history but takes Lasix intermittently for leg edema. She has not had a fever.   Past Medical History  Diagnosis Date  . Diabetes mellitus   . Hypertension   . Stroke     No past surgical history on file.  No family history on file.  History  Substance Use Topics  . Smoking status: Never Smoker   . Smokeless tobacco: Not on file  . Alcohol Use: Not on file    OB History    Grav Para Term Preterm Abortions TAB SAB Ect Mult Living                  Review of Systems Unable to assess due to language barrier and need for intervention.   Allergies  Review of patient's allergies indicates no known allergies.  Home Medications   Current Outpatient Rx  Name Route Sig Dispense Refill  . AMLODIPINE BESYLATE 5 MG PO TABS Oral Take 5 mg by mouth daily.      . ATORVASTATIN CALCIUM 40 MG PO TABS Oral Take 40 mg by mouth daily.      Marland Kitchen CLOPIDOGREL BISULFATE 75 MG PO TABS Oral Take 75 mg by mouth daily.      . INSULIN ASPART 100 UNIT/ML [place] SOLN Subcutaneous Inject 16 Units into the skin 3 (three) times daily.      . INSULIN DETEMIR 100 UNIT/ML [place] SOLN Subcutaneous Inject into the skin 2 (two) times daily. 36 units in the morning and 34 units in the evening     . METFORMIN HCL 500 MG PO TABS Oral Take 500 mg by mouth 2 (two) times daily with a meal.        . OLMESARTAN MEDOXOMIL 40 MG PO TABS Oral Take 40 mg by mouth daily.      Marland Kitchen SAXAGLIPTIN-METFORMIN 5-500 MG PO TB24 Oral Take 1 tablet by mouth daily.      Marland Kitchen CEFPROZIL 500 MG PO TABS Oral Take 500 mg by mouth 2 (two) times daily.      Marland Kitchen FLUTICASONE PROPIONATE 50 MCG/ACT NA SUSP Nasal Place 1 spray into the nose 2 (two) times daily as needed.      . FUROSEMIDE 40 MG PO TABS Oral Take 40 mg by mouth daily as needed. For fluid retention       BP 109/50  Pulse 97  Temp(Src) 98.9 F (37.2 C) (Oral)  Resp 25  Ht 5\' 1"  (1.549 m)  Wt 200 lb (90.719 kg)  BMI 37.79 kg/m2  SpO2 97%  Physical Exam  Nursing note and vitals reviewed. Constitutional: She appears well-developed and well-nourished. She appears distressed.  HENT:  Head: Normocephalic and atraumatic.  Eyes: EOM are normal. Pupils are equal, round, and reactive to light.  Neck: Normal range of motion. Neck supple.  Cardiovascular: Normal rate, normal heart sounds and intact distal  pulses.   Pulmonary/Chest: She is in respiratory distress. She has rales.  Abdominal: Bowel sounds are normal. She exhibits no distension. There is no tenderness.  Musculoskeletal: Normal range of motion. She exhibits edema (pitting edema bilateral LE). She exhibits no tenderness.  Neurological: She is alert. She has normal strength. No cranial nerve deficit or sensory deficit.  Skin: Skin is warm and dry. No rash noted.  Psychiatric: She has a normal mood and affect.    ED Course  Procedures (including critical care time)  Labs Reviewed  CBC - Abnormal; Notable for the following:    WBC 17.8 (*)    RBC 3.59 (*)    Hemoglobin 11.0 (*)    HCT 34.1 (*)    Platelets 146 (*)    All other components within normal limits  DIFFERENTIAL - Abnormal; Notable for the following:    Neutrophils Relative 89 (*)    Neutro Abs 15.8 (*)    Lymphocytes Relative 8 (*)    All other components within normal limits  CARDIAC PANEL(CRET KIN+CKTOT+MB+TROPI) -  Abnormal; Notable for the following:    Total CK 265 (*)    CK, MB 12.9 (*)    Troponin I 1.62 (*)    Relative Index 4.9 (*)    All other components within normal limits  PRO B NATRIURETIC PEPTIDE - Abnormal; Notable for the following:    BNP, POC 1233.0 (*)    All other components within normal limits  URINALYSIS, ROUTINE W REFLEX MICROSCOPIC - Abnormal; Notable for the following:    Appearance TURBID (*)    pH 8.5 (*)    Protein, ur 100 (*)    Nitrite POSITIVE (*)    Leukocytes, UA MODERATE (*)    All other components within normal limits  POCT I-STAT, CHEM 8 - Abnormal; Notable for the following:    Creatinine, Ser 1.50 (*)    Glucose, Bld 178 (*)    Hemoglobin 11.9 (*)    HCT 35.0 (*)    All other components within normal limits  POCT I-STAT TROPONIN I - Abnormal; Notable for the following:    Troponin i, poc 0.63 (*)    All other components within normal limits  URINE MICROSCOPIC-ADD ON - Abnormal; Notable for the following:    Squamous Epithelial / LPF FEW (*)    Bacteria, UA MANY (*)    All other components within normal limits  PROTIME-INR  APTT  I-STAT, CHEM 8  I-STAT TROPONIN I  URINE CULTURE   Dg Chest Port 1 View  03/06/2011  *RADIOLOGY REPORT*  Clinical Data: Shortness of breath, CHF  PORTABLE CHEST - 1 VIEW  Comparison: 03/03/2005  Findings: Diffuse asymmetric patchy airspace disease versus edema, worse in the right lung.  Heart is enlarged.  Appearance is compatible with CHF however pneumonia not excluded. No large effusion or pneumothorax.  Atherosclerosis of the aorta and degenerative changes of the spine.  Trachea midline.  IMPRESSION:  Diffuse asymmetric airspace disease versus edema.  Original Report Authenticated By: Jerilynn Mages. TREVOR Annamaria Boots, M.D.     1. NSTEMI (non-ST elevated myocardial infarction)   2. CHF (congestive heart failure)       MDM   Date: 03/06/2011  Rate: 108  Rhythm: sinus tachycardia  QRS Axis: right  Intervals: normal  ST/T Wave  abnormalities: ST depressions laterally  Conduction Disutrbances:none  Narrative Interpretation:   Old EKG Reviewed: changes noted ST depressions are new  CRITICAL CARE Performed by: Truddie Hidden.   Total critical  care time: 74min  Critical care time was exclusive of separately billable procedures and treating other patients.  Critical care was necessary to treat or prevent imminent or life-threatening deterioration.  Critical care was time spent personally by me on the following activities: development of treatment plan with patient and/or surrogate as well as nursing, discussions with consultants, evaluation of patient's response to treatment, examination of patient, obtaining history from patient or surrogate, ordering and performing treatments and interventions, ordering and review of laboratory studies, ordering and review of radiographic studies, pulse oximetry and re-evaluation of patient's condition.  Pt SpO2 was 59% on RA on arrival, started immediately on BiPAP with good improvement. She was given SL NTG with a temporary drop in her BP. Lasix IV given and foley placed. CXR consistent with CHF although airspace disease is not excluded. Pt has a WBC, but no fever. With positive troponin, suspect this is NSTEMI with acute CHF as opposed to infection. Heparin ordered. Discussed with [facility] who will come evaluate in the ED. NTG drip started after improved BP. Pt appears much more comfortable on BiPAP.        Charles B. Karle Starch, MD 03/06/11 1233

## 2011-03-06 NOTE — Progress Notes (Signed)
ANTICOAGULATION CONSULT NOTE - Follow Up Consult  Pharmacy Consult for Heparin Indication: chest pain/ACS Rule out non-STEMI  No Known Allergies  Patient Measurements: Height: 5\' 1"  (154.9 cm) Weight: 200 lb (90.719 kg) IBW/kg (Calculated) : 47.8  Adjusted Body Weight:   Vital Signs: Temp: 99.7 F (37.6 C) (11/27 1756) Temp src: Core (Comment) (11/27 1620) BP: 124/61 mmHg (11/27 2032) Pulse Rate: 103  (11/27 2030)  Labs:  [facility] 03/06/11 2110 03/06/11 2015 03/06/11 1047 03/06/11 1037  HGB -- 9.5* 11.9* --  HCT -- 30.2* 35.0* 34.1*  PLT -- 145* -- 146*  APTT -- -- -- 36  LABPROT -- 16.0* -- 14.4  INR -- 1.25 -- 1.10  HEPARINUNFRC 0.44 >2.00* -- --  CREATININE -- 1.44* 1.50* --  CKTOTAL -- 582* -- 265*  CKMB -- 41.6* -- 12.9*  TROPONINI -- 16.55* -- 1.62*   Estimated Creatinine Clearance: 35.2 ml/min (by C-G formula based on Cr of 1.44).  Medical History: Past Medical History  Diagnosis Date  . Diabetes mellitus   . Hypertension   . Stroke   . CHF (congestive heart failure)   . Arthritis   . Headache   . History of seasonal allergies     Medications:  Scheduled:     . amLODipine  5 mg Oral Daily  . aspirin  324 mg Oral Once  . aspirin  324 mg Oral NOW  . aspirin EC  81 mg Oral Daily  . cefTRIAXone  1 g Intramuscular Q24H  . clopidogrel  75 mg Oral Daily  . furosemide  80 mg Intravenous Once  . furosemide  80 mg Intravenous BID  . heparin  4,000 Units Intravenous Once  . insulin aspart  0-15 Units Subcutaneous TID WC  . insulin aspart  0-5 Units Subcutaneous QHS  . lidocaine      . metoprolol tartrate  12.5 mg Oral BID  . nitroGLYCERIN      . olmesartan  20 mg Oral Daily  . pantoprazole (PROTONIX) IV  40 mg Intravenous Q24H  . simvastatin  20 mg Oral Daily  . DISCONTD: heparin  4,000 Units Intravenous Once   Infusions:     . heparin 1,000 Units/hr (03/06/11 1232)  . DISCONTD: heparin      Assessment:  76 yo female presented to ED with  SOB, hx CHF, non-compliant with Furosemide.   Elevated Troponin 0.63, BNP 1233, baseline INR normal-no hx anti-coagulation.  Heparin level is therapeutic at 0.44.  The first heparin level resulted as >2 and this was repeated and resulted at 0.44. The first level must have been drawn from where the heparin was infusing. Continue current rate since this appears to be therapeutic.  Goal of Therapy:  Heparin level 0.3-0.7 units/ml   Plan:  Continue IV heparin at 1000 units/hr (10 ml/min). Next heparin level to be drawn with AM labs.  [name] 03/06/2011,9:47 PM

## 2011-03-06 NOTE — Discharge Planning (Signed)
ED CM spoke with Daughter, [place] and other family including husband in ED. [place]anc[place] translated.  Reports previous use of [facility]) for PT Prefers/agreed to [facility] services again if needed.  Prefers uses of same [facility] staff used "about 3 months ago" C/o "numbness and pain" of Left lower extremity CM discussed these LLE issues with ED RN CM spoke with Cyril Mourning of [facility] to inform of admission, request for specific [facility] staff and for follow up in hospital

## 2011-03-06 NOTE — Discharge Planning (Signed)
ED CM received referral from Admission RN about possible need for Home health care Pt does not speak English only [place] but family at bedside.  CM reviewed emstat and midas hx.  Cm attempted to speak with Maudie Mercury, daughter about home care services but [place] and husband presently out of room at lunch per other family at bedside Cm will attempt again to see [place], daughter to speak with her about [facility]

## 2011-03-06 NOTE — Progress Notes (Signed)
CRITICAL VALUE ALERT  Critical value received:  Troponin=16.5, CKMB=41.6  Date of notification:  03/06/11  Time of notification:  2200  Critical value read back:yes  Nurse who received alert:  Dewitt Hoes   MD notified (1st page):  Kizzie Bane NP  Time of first page:  2215  MD notified (2nd page):  Time of second page:  Responding MD:  Kizzie Bane NP  Time MD responded:  2217

## 2011-03-06 NOTE — ED Notes (Signed)
Pt to ER with SOB and edema. Per the daughter pt went to her PCP for SOB for several days. Daughter states that pt has been noncompliant with taking her lasix for several months

## 2011-03-06 NOTE — ED Notes (Addendum)
Insufficient dose of Lasix available in the Pyxis to administer. Pharmacy to send

## 2011-03-06 NOTE — ED Notes (Signed)
Place on 3L by [employer]. O2 sat currently 93% no resp distress noted

## 2011-03-07 ENCOUNTER — Inpatient Hospital Stay (HOSPITAL_COMMUNITY): Payer: Medicare Other

## 2011-03-07 DIAGNOSIS — J189 Pneumonia, unspecified organism: Secondary | ICD-10-CM | POA: Diagnosis present

## 2011-03-07 DIAGNOSIS — I214 Non-ST elevation (NSTEMI) myocardial infarction: Secondary | ICD-10-CM | POA: Diagnosis present

## 2011-03-07 HISTORY — PX: DOPPLER ECHOCARDIOGRAPHY: SHX263

## 2011-03-07 LAB — CBC
HCT: 29.5 % — ABNORMAL LOW (ref 36.0–46.0)
Hemoglobin: 9.2 g/dL — ABNORMAL LOW (ref 12.0–15.0)
MCH: 29.6 pg (ref 26.0–34.0)
MCHC: 31.2 g/dL (ref 30.0–36.0)
MCV: 94.9 fL (ref 78.0–100.0)
Platelets: 150 10*3/uL (ref 150–400)
RBC: 3.11 MIL/uL — ABNORMAL LOW (ref 3.87–5.11)
RDW: 13.2 % (ref 11.5–15.5)
WBC: 12.5 10*3/uL — ABNORMAL HIGH (ref 4.0–10.5)

## 2011-03-07 LAB — CARDIAC PANEL(CRET KIN+CKTOT+MB+TROPI)
CK, MB: 36 ng/mL (ref 0.3–4.0)
CK, MB: 29.3 ng/mL (ref 0.3–4.0)
Relative Index: 6.7 — ABNORMAL HIGH (ref 0.0–2.5)
Relative Index: 5.8 — ABNORMAL HIGH (ref 0.0–2.5)
Relative Index: 3 — ABNORMAL HIGH (ref 0.0–2.5)
Total CK: 536 U/L — ABNORMAL HIGH (ref 7–177)
Total CK: 505 U/L — ABNORMAL HIGH (ref 7–177)
Troponin I: 14.15 ng/mL (ref <0.30)
Troponin I: 15.69 ng/mL (ref <0.30)

## 2011-03-07 LAB — GLUCOSE, CAPILLARY
Glucose-Capillary: 140 mg/dL — ABNORMAL HIGH (ref 70–99)
Glucose-Capillary: 246 mg/dL — ABNORMAL HIGH (ref 70–99)

## 2011-03-07 LAB — BASIC METABOLIC PANEL
BUN: 26 mg/dL — ABNORMAL HIGH (ref 6–23)
CO2: 28 mEq/L (ref 19–32)
Calcium: 8.7 mg/dL (ref 8.4–10.5)
Chloride: 105 mEq/L (ref 96–112)
Creatinine, Ser: 1.69 mg/dL — ABNORMAL HIGH (ref 0.50–1.10)
GFR calc Af Amer: 33 mL/min — ABNORMAL LOW (ref >90)
GFR calc non Af Amer: 29 mL/min — ABNORMAL LOW (ref >90)
Glucose, Bld: 173 mg/dL — ABNORMAL HIGH (ref 70–99)
Potassium: 4.1 mEq/L (ref 3.5–5.1)
Sodium: 142 mEq/L (ref 135–145)

## 2011-03-07 LAB — HEMOGLOBIN A1C
Hgb A1c MFr Bld: 6.6 % — ABNORMAL HIGH (ref <5.7)
Mean Plasma Glucose: 143 mg/dL — ABNORMAL HIGH (ref <117)

## 2011-03-07 LAB — LIPID PANEL
Cholesterol: 116 mg/dL (ref 0–200)
HDL: 60 mg/dL (ref >39)
LDL Cholesterol: 36 mg/dL (ref 0–99)
Total CHOL/HDL Ratio: 1.9 RATIO
Triglycerides: 100 mg/dL (ref <150)
VLDL: 20 mg/dL (ref 0–40)

## 2011-03-07 LAB — BLOOD GAS, ARTERIAL
Acid-Base Excess: 0.9 mmol/L (ref 0.0–2.0)
Delivery systems: POSITIVE
Drawn by: 244901
Inspiratory PAP: 12
pCO2 arterial: 52.5 mmHg — ABNORMAL HIGH (ref 35.0–45.0)
pH, Arterial: 7.328 — ABNORMAL LOW (ref 7.350–7.400)

## 2011-03-07 LAB — TSH: TSH: 0.683 u[IU]/mL (ref 0.350–4.500)

## 2011-03-07 LAB — HEPARIN LEVEL (UNFRACTIONATED): Heparin Unfractionated: 0.45 IU/mL (ref 0.30–0.70)

## 2011-03-07 MED ORDER — ALBUTEROL SULFATE (5 MG/ML) 0.5% IN NEBU
2.5000 mg | INHALATION_SOLUTION | Freq: Four times a day (QID) | RESPIRATORY_TRACT | Status: DC | PRN
  Administered 2011-03-08: 2.5 mg via RESPIRATORY_TRACT
  Filled 2011-03-07: qty 0.5

## 2011-03-07 MED ORDER — BIOTENE DRY MOUTH MT LIQD
15.0000 mL | Freq: Two times a day (BID) | OROMUCOSAL | Status: DC
  Administered 2011-03-07 – 2011-03-10 (×5): 15 mL via OROMUCOSAL

## 2011-03-07 MED ORDER — AZITHROMYCIN 500 MG PO TABS
500.0000 mg | ORAL_TABLET | Freq: Every day | ORAL | Status: AC
  Administered 2011-03-07: 500 mg via ORAL
  Filled 2011-03-07 (×2): qty 1

## 2011-03-07 MED ORDER — CHLORHEXIDINE GLUCONATE 0.12 % MT SOLN
15.0000 mL | Freq: Two times a day (BID) | OROMUCOSAL | Status: DC
  Administered 2011-03-07 – 2011-03-12 (×7): 15 mL via OROMUCOSAL
  Filled 2011-03-07 (×13): qty 15

## 2011-03-07 MED ORDER — AZITHROMYCIN 250 MG PO TABS
250.0000 mg | ORAL_TABLET | Freq: Every day | ORAL | Status: AC
  Administered 2011-03-08 – 2011-03-11 (×4): 250 mg via ORAL
  Filled 2011-03-07 (×4): qty 1

## 2011-03-07 MED ORDER — ACETAMINOPHEN 325 MG PO TABS
650.0000 mg | ORAL_TABLET | Freq: Four times a day (QID) | ORAL | Status: DC | PRN
  Administered 2011-03-07 – 2011-03-08 (×2): 650 mg via ORAL

## 2011-03-07 NOTE — Progress Notes (Signed)
*  PRELIMINARY RESULTS* Echocardiogram 2D Echocardiogram has been performed.  Roxine Caddy [facility] 03/07/2011, 11:39 AM

## 2011-03-07 NOTE — Progress Notes (Signed)
ANTICOAGULATION CONSULT NOTE - Follow Up Consult  Pharmacy Consult for  Heparin Indication: chest pain/ACS r/o non-STEMI  No Known Allergies  Patient Measurements: Height: 5\' 1"  (154.9 cm) Weight: 198 lb 10.2 oz (90.1 kg) IBW/kg (Calculated) : 47.8    Vital Signs: Temp: 100.4 F (38 C) (11/28 0600) Temp src: Core (Comment) (11/28 0400) BP: 111/50 mmHg (11/28 0600) Pulse Rate: 80  (11/28 0600)  Labs:  [facility] 03/07/11 0542 03/07/11 0006 03/06/11 2110 03/06/11 2015 03/06/11 1047 03/06/11 1037  HGB 9.2* -- -- 9.5* -- --  HCT 29.5* -- -- 30.2* 35.0* --  PLT 150 -- -- 145* -- 146*  APTT -- -- -- -- -- 36  LABPROT -- -- -- 16.0* -- 14.4  INR -- -- -- 1.25 -- 1.10  HEPARINUNFRC 0.45 -- 0.44 >2.00* -- --  CREATININE -- -- -- 1.44* 1.50* --  CKTOTAL -- 536* -- 582* -- 265*  CKMB -- 36.0* -- 41.6* -- 12.9*  TROPONINI -- 14.15* -- 16.55* -- 1.62*   Estimated Creatinine Clearance: 35 ml/min (by C-G formula based on Cr of 1.44).   Medications:  Scheduled:    . amLODipine  5 mg Oral Daily  . antiseptic oral rinse  15 mL Mouth Rinse q12n4p  . aspirin  324 mg Oral Once  . aspirin  324 mg Oral NOW  . aspirin EC  81 mg Oral Daily  . cefTRIAXone  1 g Intramuscular Q24H  . chlorhexidine  15 mL Mouth Rinse BID  . clopidogrel  75 mg Oral Daily  . furosemide  40 mg Intravenous Once  . furosemide  80 mg Intravenous Once  . furosemide  80 mg Intravenous BID  . heparin  4,000 Units Intravenous Once  . insulin aspart  0-15 Units Subcutaneous TID WC  . insulin aspart  0-5 Units Subcutaneous QHS  . lidocaine      . metoprolol tartrate  12.5 mg Oral BID  . olmesartan  20 mg Oral Daily  . pantoprazole (PROTONIX) IV  40 mg Intravenous Q24H  . simvastatin  20 mg Oral Daily  . DISCONTD: heparin  4,000 Units Intravenous Once   Infusions:    . heparin 1,000 Units (03/07/11 0600)  . nitroGLYCERIN 10 mcg/min (03/07/11 0600)  . DISCONTD: heparin      Assessment: 1.  HL = 0.45 units/ml  after heparin @ 1000 units/hr (10 ml/hr). 2.  x2 levels within therapeutic range. 3.  Continue heparin @ 1000 units/hr. 4.  F/U HL in am.  Goal of Therapy:  Heparin level 0.3-0.7 units/ml   Plan:  1.   x2 levels within therapeutic range. 2.   Continue heparin @ 1000 units/hr. 3.  F/U HL in am.   Dorrene German 03/07/2011,6:29 AM

## 2011-03-07 NOTE — Clinical Documentation Improvement (Signed)
GENERIC DOCUMENTATION CLARIFICATION QUERY  THIS DOCUMENT IS NOT A PERMANENT PART OF THE MEDICAL RECORD  Please update your documentation within the medical record to reflect your response to this query.                                                                                         03/07/11    Dear Dr. Debara Pickett / Associates,  In a better effort to capture your patient's severity of illness, reflect appropriate length of stay and utilization of resources, a review of the patient medical record has revealed the following indicators.     Pt admitted with CHF.    Based on lab results pt with + U/A. Please clarify the underlying diagnoses responsible for the abnormal lab result and document in pn and d/c summary. Thank you!    _______Other Condition__________________ _______Cannot Clinically Determine   Supporting Information:  Risk Factors:  Signs & Symptoms: NSTEMI, Acute CHF,   Diagnostics: U/A 03/06/2011 10:43 Color, Urine: YELLOW Appearance: TURBID (A) Specific Gravity, Urine: 1.018 pH: 8.5 (H) Leukocytes, UA: MODERATE (A) WBC, UA: 21-50 Bacteria, UA: MANY (A)  Treatment cefTRIAXone (ROCEPHIN) injection 1 g cefPROZIL (CEFZIL) 500 MG tablet    You may use possible, probable, or suspect with inpatient documentation. possible, probable, suspected diagnoses MUST be documented at the time of discharge   Based on your clinical judgment, please clarify and document in a progress note and/or discharge summary the clinical condition associated with the following supporting information:  In responding to this query please exercise your independent judgment.  The fact that a query is asked, does not imply that any particular answer is desired or expected.  Reviewed: additional documentation in the medical record  Thank You, Sincerely, Heloise Beecham RN, BSN, CCDS Clinical Documentation Specialist [facility] Pager: [place]:  1. If needed, update documentation for the patient's encounter via the notes activity.  2. Access this query again and click edit on the [employer].  3. After updating, or not, click F2 to complete all highlighted (required) fields concerning your review. Select "additional documentation in the medical record" OR "no additional documentation provided".  4. Click Sign note button.  5. The deficiency will fall out of your InBasket *Please let us know if you are not able to compete this workflow by phone or e-mail (listed below).

## 2011-03-07 NOTE — Progress Notes (Signed)
Pt has been off and on BIPAP 12/16. She is on 6 lpm  at this time and awaiting transport to [facility].

## 2011-03-07 NOTE — Progress Notes (Signed)
Rt removed pt from BiPAP for RN to complete mouthcare and give oral meds- placed on 6 lmp [place]- RN aware and PT states she is breathing fine- does not appear to be in any distress at this time. RN confirmed she will monitor PT and contact RT if needed.

## 2011-03-07 NOTE — Plan of Care (Signed)
Problem: Phase I Progression Outcomes Goal: EF % per last Echo/documented,Core Reminder form on chart Outcome: Progressing 60-65%

## 2011-03-07 NOTE — Progress Notes (Addendum)
[place]  DAILY PROGRESS NOTE  Patient ID: Danielle [name], female   DOB: Sep 14, 1934, 76 y.o.   MRN: [id number]  Patient Description   76 y/o [name] with no prior history of CAD or CHF, sent from Primary Care MD office with increasing SOB. On arrival to ED she was in respiratory distress with O2 sats in the 50s. She was treated with IV Lasix and BiPap with improvement. Her family says she has been suffering from URI for past few days. Last night symptoms progressed to orthopnea. There is no history from family of chest pain. The patient speaks no [place].  Her daughter Kurr is acting at family liaison and interpreter.     Subjective:  Breathing better today on O2 by [place].  Objective:  Temp:  [98.7 F (37.1 C)-100.9 F (38.3 C)] 100.9 F (38.3 C) (11/28 1200) Pulse Rate:  [78-104] 79  (11/28 1200) Resp:  [15-32] 20  (11/28 1200) BP: (98-153)/(47-99) 108/65 mmHg (11/28 1200) SpO2:  [84 %-99 %] 94 % (11/28 1200) FiO2 (%):  [50 %-60 %] 60 % (11/28 0900) Weight:  [88.4 kg (194 lb 14.2 oz)-90.1 kg (198 lb 10.2 oz)] 194 lb 14.2 oz (88.4 kg) (11/28 0600) Weight change:   Intake/Output from previous day: 11/27 0701 - 11/28 0700 In: 155 [I.V.:147; IV Piggyback:8] Out: 2185 [Urine:2185]  Intake/Output Summary (Last 24 hours) at 03/07/11 1232 Last data filed at 03/07/11 1200  Gross per 24 hour  Intake    228 ml  Output   3035 ml  Net  -2807 ml    Physical Exam: General appearance: alert, mild distress, morbidly obese and non-toxic Neck: no carotid bruit, no JVD, supple, symmetrical, trachea midline, thyroid not enlarged, symmetric, no tenderness/mass/nodules and very thick neck - unable to assess JVD (IVC on Echo not dilated with normal collapse) Lungs: dullness to percussion bibasilar, rales bilaterally and wheezes anterior - bilateral Heart: regular rate and rhythm, S1, S2 normal, S4 present, no rub and distant heart sounds, but appears to have a ~1-2/6  early peaking SEM (crescendo,-decrescendo) @ RUSB) Abdomen: soft, non-tender; bowel sounds normal; no masses,  no organomegaly and obese, rotund - pt & family state this is @ her baseline.  Unable to determine HSM, no evidence of ascites. Extremities: extremities normal, atraumatic, no cyanosis or edema Pulses: weak / thready radial pulses as well as DP pulses bilaterally. Skin: Skin color, texture, turgor normal. No rashes or lesions Neurologic: Grossly normal, A&O x 3, CN II-XII grossly intact.  Lab Results: CBC    Component Value Date/Time   WBC 12.5* 03/07/2011 0542   RBC 3.11* 03/07/2011 0542   HGB 9.2* 03/07/2011 0542   HCT 29.5* 03/07/2011 0542   PLT 150 03/07/2011 0542   MCV 94.9 03/07/2011 0542   MCH 29.6 03/07/2011 0542   MCHC 31.2 03/07/2011 0542   RDW 13.2 03/07/2011 0542   LYMPHSABS 1.3 03/06/2011 2015   MONOABS 0.6 03/06/2011 2015   EOSABS 0.0 03/06/2011 2015   BASOSABS 0.0 03/06/2011 2015   Urinalysis reviewed: Suggests UTI; culture pending  Imaging: Imaging reviewed. Chest x-ray demonstrates diffuse is asymmetric airspace disease versus edema  echocardiogram:  - Moderate concentric LVH. EF of 55% to 60%. Possible mild posterolateral hypokinesis.  At least grade 1 (however on my preliminary evaluation appears to be grade 2 ) diastolic dysfunction with severely elevated mean left atrial filling pressure. - Mitral valve: Moderately to severely calcified posterior MV annulus. Mild regurgitation. Right ventricle /  Pulmonary artery pressure : Mildly increased. Peak pressure roughly 32 mm Hg   Clinical Course:  Admitted overnight, treated with IV nitroglycerin glycerin and heparin along with IV furosemide with excellent diuresis. However she did worsen symptomatically requiring BiPAP overnight and additional Lasix plus morphine. She is now stable on 6 L of oxygen.  MAR Reviewed    . amLODipine  5 mg Oral Daily  . antiseptic oral rinse  15 mL Mouth Rinse q12n4p  .  aspirin  324 mg Oral Once  . aspirin  324 mg Oral NOW  . aspirin EC  81 mg Oral Daily  . cefTRIAXone  1 g Intramuscular Q24H  . chlorhexidine  15 mL Mouth Rinse BID  . clopidogrel  75 mg Oral Daily  . furosemide  40 mg Intravenous Once  . furosemide  80 mg Intravenous BID  . heparin  4,000 Units Intravenous Once  . insulin aspart  0-15 Units Subcutaneous TID WC  . insulin aspart  0-5 Units Subcutaneous QHS  . lidocaine      . metoprolol tartrate  12.5 mg Oral BID  . olmesartan  20 mg Oral Daily  . pantoprazole (PROTONIX) IV  40 mg Intravenous Q24H  . simvastatin  20 mg Oral Daily      . heparin 1,000 Units (03/07/11 1200)  . nitroGLYCERIN 10 mcg/min (03/07/11 1200)     Assessment/Plan:  Principal Problem:   *Respiratory failure with hypoxia Active Problems:   Diastolic CHF, acute   Diabetes mellitus   HTN (hypertension), benign   Dyslipidemia   PVD (peripheral vascular disease)   CVA (cerebral infarction)   Obesity (BMI 30-39.9)   Chronic renal insufficiency, stage III (moderate)   Fever-101 with elevated white blood cell count and cough; currently on Rocephin for possible pneumonia / bronchitis; will add azithromycin  Non-ST elevation MI -- troponin is 16, inferolateral ST depression on ECG.  Chronic renal insufficiency: Class 2-3  Likely urinary tract infection pending culture  Ms. Streeval seems to have stabilized overnight with BiPAP and Lasix. She most certainly has had a myocardial infarction with elevated troponin and ST segment depressions in inferolateral leads. Present are read her echocardiogram demonstrated preserved ejection fraction with possible inferolateral/inferobasilar hypokinesis with likely grade 2 diastolic dysfunction and elevated left atrial pressures. Based on acuity of her presentation with elevated troponins, pulmonary edema requiring stepped-down level care, we'll transfer her to [facility] stepped-down for closer monitoring by our  practice. I had a long talk with the patient's daughter day who will serve as an interpreter about her. Unfortunately that time I did not know but her fever. She is currently hemodynamically stable, but appears to have some increased worker breathing. She was able to lie flat for me however.  I explained to her that her mother is a high risk non-STEMI based on positive biomarkers and EKG changes along with pulmonary edema. Our recommendation will be to proceed to cardiac catheterization once know she is able to lie flat the procedure and are sure her renal function will tolerate contrast.  See my consent note below for details of the discussion however I stressed concern for renal renal insufficiency, stroke history, and pulmonary edema.  My concern is that with EKG changes she has, she may likely have multivessel disease. I gave her the potential treatments allergies of early invasive versus early conservative therapy. Invasive therapy would likely be delayed until she is more stable respiratory and we standpoint.  The daughter agrees that the invasive  strategy is likely best strategy.  I informed her of the possibility of percutaneous intervention, medical therapy alone, or coronary artery bypass grafting surgery. Daughter reports that she will discuss this with her mother and family members tonight in order for them to deal to discuss strategy with the physicians tomorrow morning. Presentation appears to be this accommodation of pneumonia with acute myocardial infarction leading to pulmonary edema secondary to acute diastolic heart.  Plan:  Continue intravenous heparin for at least 72 hours, 325 mg aspirin plus clopidogrel that she is on chronically, and statin.  Agree with cutting back on her ARB for renal insufficiency. Agree with low-dose beta blocker for now would not titrate until no longer pulmonary edema.  Intravenous nitroglycerin for afterload and preload reduction along with IV Lasix as her left  ventricular filling pressures appear to be well elevated on the echocardiogram. We will need to monitor her renal status.  Continuing IV antibiotic for UTI with also possible pneumonia on chest x-ray.  Will recheck chest x-ray tonight to reassess pulmonary edema and pneumonia.  Please BiPAP at night  She'll most certainly diagnostically benefit from cardiac catheterization, but is not currently able tolerate lying flat, has mildly worsening renal insufficiency and fever.  If her symptoms worsen overnight, emergent catheterization will be considered.  I did discuss this with the patient's daughter will explain to her. We'll reassess her status in the morning but will make her n.p.o. after midnight.   The Cardiac Catheterization with Possible Percutaneous Coronary Intervention procedure with Risks/Benefits/Alternatives and Indications was reviewed with the patient and her daughter.  All questions were answered.    Risks / Complications include, but not limited to: Death, MI, CVA/TIA, VF/VT (with defibrillation), Bradycardia (need for temporary pacer placement), contrast induced nephropathy , bleeding / bruising / hematoma / pseudoaneurysm, vascular or coronary injury (with possible emergent CT or Vascular Surgery), adverse medication reactions, infection.  Again a stressed concern for worsening renal insufficiency due to contrast nephropathy, stroke risk, respiratory distress.   Time Spent Directly with Patient and family:  45 minutes  Length of Stay:  LOS: 1 day    HARDING,DAVID W, M.D., M.S. [facility] [address] [place], [place]  [id number]  [phone]  03/07/2011 12:32 PM

## 2011-03-07 NOTE — Clinical Documentation Improvement (Signed)
CHF DOCUMENTATION CLARIFICATION QUERY  THIS DOCUMENT IS NOT A PERMANENT PART OF THE MEDICAL RECORD  Please update your documentation within the medical record to reflect your response to this query.                                                                                     03/07/11  Dear Dr. Debara Pickett, [facility],  In a better effort to capture your patient's severity of illness, reflect appropriate length of stay and utilization of resources, a review of the patient medical record has revealed the following indicators the diagnosis of Heart Failure.      Pt admitted with Acute CHF. Please clarify the the type of CHF and document in pn or d/c summary.  Acute Systolic Congestive Heart Failure Acute Diastolic Congestive Heart Failure Acute Systolic & Diastolic Congestive Heart Failure Acute on Chronic Systolic Congestive Heart Failure Acute on Chronic Diastolic Congestive Heart Failure Acute on Chronic Systolic & Diastolic  Congestive Heart Failure Other Condition________________________________________ Cannot Clinically Determine  Supporting Information:  Risk Factors: Acute CHF, NSTEMI, Resp Failure, HTN,   Signs & Symptoms: SOB, resp distress, Low SPO2 Diagnostics: BNP, POC 1233.0 (H)    Treatment: levalbuterol (XOPENEX) nebulizer   O2 by [place] BiPAP metoprolol tartrate   12.5 mg    furosemide   40 mg  Intravenous  Once   furosemide   80 mg                       Based on your clinical judgment, please clarify and document in a progress note and/or discharge summary the clinical condition associated with the following supporting information:  In responding to this query please exercise your independent judgment.  The fact that a query is asked, does not imply that any particular answer is desired or expected.  Reviewed: additional documentation in the medical record  Thank You,  Sincerely, Lolita J HenleyLolita J. Ulanda Edison RN, BSN, CCDS Clinical  Documentation Specialist [facility] Pager: [phone]  Health Information Management [place]   TO RESPOND TO THE THIS QUERY, FOLLOW THE INSTRUCTIONS BELOW:  1. If needed, update documentation for the patient's encounter via the notes activity.  2. Access this query again and click edit on the [employer].  3. After updating, or not, click F2 to complete all highlighted (required) fields concerning your review. Select "additional documentation in the medical record" OR "no additional documentation provided".  4. Click Sign note button.  5. The deficiency will fall out of your InBasket *Please let us know if you are not able to compete this workflow by phone or e-mail (listed below).

## 2011-03-08 ENCOUNTER — Inpatient Hospital Stay (HOSPITAL_COMMUNITY): Payer: Medicare Other

## 2011-03-08 DIAGNOSIS — N390 Urinary tract infection, site not specified: Secondary | ICD-10-CM | POA: Clinically undetermined

## 2011-03-08 LAB — BASIC METABOLIC PANEL
BUN: 34 mg/dL — ABNORMAL HIGH (ref 6–23)
CO2: 25 mEq/L (ref 19–32)
Calcium: 8.9 mg/dL (ref 8.4–10.5)
Chloride: 100 mEq/L (ref 96–112)
Creatinine, Ser: 1.72 mg/dL — ABNORMAL HIGH (ref 0.50–1.10)
GFR calc Af Amer: 33 mL/min — ABNORMAL LOW (ref >90)
GFR calc non Af Amer: 28 mL/min — ABNORMAL LOW (ref >90)
Glucose, Bld: 214 mg/dL — ABNORMAL HIGH (ref 70–99)
Potassium: 3.4 mEq/L — ABNORMAL LOW (ref 3.5–5.1)
Sodium: 140 mEq/L (ref 135–145)

## 2011-03-08 LAB — IRON AND TIBC
Iron: 16 ug/dL — ABNORMAL LOW (ref 42–135)
UIBC: 246 ug/dL (ref 125–400)

## 2011-03-08 LAB — GLUCOSE, CAPILLARY

## 2011-03-08 LAB — CARDIAC PANEL(CRET KIN+CKTOT+MB+TROPI)
CK, MB: 9.8 ng/mL (ref 0.3–4.0)
Relative Index: 2.6 — ABNORMAL HIGH (ref 0.0–2.5)
Total CK: 380 U/L — ABNORMAL HIGH (ref 7–177)
Troponin I: 3.15 ng/mL (ref <0.30)

## 2011-03-08 LAB — URINE CULTURE
Colony Count: >=100000
Culture  Setup Time: 201211271417

## 2011-03-08 LAB — PRO B NATRIURETIC PEPTIDE: Pro B Natriuretic peptide (BNP): 1055 pg/mL — ABNORMAL HIGH (ref 0–125)

## 2011-03-08 LAB — CBC
HCT: 30.5 % — ABNORMAL LOW (ref 36.0–46.0)
MCHC: 32.5 g/dL (ref 30.0–36.0)
MCV: 93.8 fL (ref 78.0–100.0)
RDW: 13 % (ref 11.5–15.5)
WBC: 9.5 10*3/uL (ref 4.0–10.5)

## 2011-03-08 LAB — FERRITIN: Ferritin: 189 ng/mL (ref 10–291)

## 2011-03-08 LAB — RETICULOCYTES: Retic Count, Absolute: 61.8 10*3/uL (ref 19.0–186.0)

## 2011-03-08 LAB — HEPARIN LEVEL (UNFRACTIONATED): Heparin Unfractionated: 0.44 IU/mL (ref 0.30–0.70)

## 2011-03-08 MED ORDER — WHITE PETROLATUM GEL
Status: AC
  Administered 2011-03-08: 10:00:00
  Filled 2011-03-08: qty 5

## 2011-03-08 MED ORDER — FUROSEMIDE 10 MG/ML IJ SOLN
80.0000 mg | Freq: Three times a day (TID) | INTRAMUSCULAR | Status: DC
  Administered 2011-03-08 – 2011-03-13 (×14): 80 mg via INTRAVENOUS
  Filled 2011-03-08 (×15): qty 8

## 2011-03-08 MED ORDER — DEXTROSE 5 % IV SOLN
1.0000 g | INTRAVENOUS | Status: DC
  Administered 2011-03-08 – 2011-03-11 (×4): 1 g via INTRAVENOUS
  Filled 2011-03-08 (×5): qty 10

## 2011-03-08 MED ORDER — OLMESARTAN 10 MG HALF TABLET
10.0000 mg | ORAL_TABLET | Freq: Every day | ORAL | Status: DC
  Filled 2011-03-08: qty 1

## 2011-03-08 MED ORDER — DOCUSATE SODIUM 100 MG PO CAPS
100.0000 mg | ORAL_CAPSULE | Freq: Two times a day (BID) | ORAL | Status: DC
  Administered 2011-03-08 – 2011-03-12 (×8): 100 mg via ORAL
  Filled 2011-03-08 (×9): qty 1

## 2011-03-08 MED ORDER — INSULIN DETEMIR 100 UNIT/ML ~~LOC~~ SOLN
15.0000 [IU] | Freq: Two times a day (BID) | SUBCUTANEOUS | Status: DC
  Administered 2011-03-08 – 2011-03-11 (×8): 15 [IU] via SUBCUTANEOUS
  Administered 2011-03-12: 7.5 [IU] via SUBCUTANEOUS
  Administered 2011-03-12 – 2011-03-13 (×2): 15 [IU] via SUBCUTANEOUS
  Filled 2011-03-08: qty 3

## 2011-03-08 MED ORDER — LACTULOSE 10 GM/15ML PO SOLN
20.0000 g | Freq: Every day | ORAL | Status: DC
  Administered 2011-03-08 – 2011-03-15 (×4): 20 g via ORAL
  Filled 2011-03-08 (×10): qty 30

## 2011-03-08 MED ORDER — BISACODYL 10 MG RE SUPP
10.0000 mg | Freq: Every day | RECTAL | Status: DC | PRN
  Administered 2011-03-08 – 2011-03-11 (×2): 10 mg via RECTAL
  Filled 2011-03-08 (×2): qty 1

## 2011-03-08 MED ORDER — POTASSIUM CHLORIDE CRYS ER 20 MEQ PO TBCR
20.0000 meq | EXTENDED_RELEASE_TABLET | Freq: Every day | ORAL | Status: DC
  Administered 2011-03-08 – 2011-03-17 (×9): 20 meq via ORAL
  Filled 2011-03-08 (×13): qty 1

## 2011-03-08 NOTE — Progress Notes (Signed)
Subjective: SOB with chest heaviness which is constant. No worse with deep breath.  Her daughter was interpreter.  Pt also complained of constipation. Also very hungry, has not had diet for . 24hrs.  Also complains of headache.   Objective: Vital signs in last 24 hours: Temp:  [98 F (36.7 C)-101.5 F (38.6 C)] 99.2 F (37.3 C) (11/29 0800) Pulse Rate:  [79-97] 85  (11/29 0800) Resp:  [14-27] 20  (11/29 0800) BP: (81-142)/(43-92) 121/62 mmHg (11/29 0800) SpO2:  [90 %-97 %] 93 % (11/29 0800) Weight:  [89.3 kg (196 lb 13.9 oz)] 196 lb 13.9 oz (89.3 kg) (11/29 0500) Weight change: -1.419 kg (-3 lb 2.1 oz) No weight change in 24 hours.   Intake/Output from previous day:   -2801.  (03/06/11==-2030)  11/28 0701 - 11/29 0700 In: 274 [I.V.:266; IV Piggyback:8] Out: 3075 [Urine:3075] Intake/Output this shift: Total I/O In: -  Out: 250 [Urine:250]  PE: General:Sleepy but wakes to questions, appears quite ill, though pt and family agree she is better than on admit.  Heart:S1S2, RRR no obvious murmur. Lungs:Tight with wheezes. [id number] distended.  + BS.  Non tender. EXT:+ edema from hips up into back and arms.  Legs per family is much improved.    Lab Results:  Basename 03/07/11 0542 03/06/11 2015  WBC 12.5* 15.9*  HGB 9.2* 9.5*  HCT 29.5* 30.2*  PLT 150 145*   BMET  Basename 03/08/11 0432 03/07/11 0542  NA 140 142  K 3.4* 4.1  CL 100 105  CO2 25 28  GLUCOSE 214* 173*  BUN 34* 26*  CREATININE 1.72* 1.69*  CALCIUM 8.9 8.7    Basename 03/08/11 0432 03/07/11 1848  TROPONINI 3.15* 6.86*    Lab Results  Component Value Date   CHOL 116 03/07/2011   HDL 60 03/07/2011   LDLCALC 36 03/07/2011   TRIG 100 03/07/2011   CHOLHDL 1.9 03/07/2011   Lab Results  Component Value Date   HGBA1C 6.6* 03/06/2011     Lab Results  Component Value Date   TSH 0.683 03/06/2011    Hepatic Function Panel  Basename 03/06/11 2015  PROT 7.0  ALBUMIN 3.3*  AST 57*  ALT 16    ALKPHOS 71  BILITOT 0.9  BILIDIR --  IBILI --    Basename 03/07/11 0542  CHOL 116   No results found for this basename: PROTIME in the last 72 hours    EKG: Orders placed during the hospital encounter of 03/06/11  . EKG 12-LEAD  . EKG 12-LEAD    Studies/Results: Dg Chest Port 1 View  03/07/2011  *RADIOLOGY REPORT*  Clinical Data: Follow up pulmonary edema/pneumonia.  PORTABLE CHEST - 1 VIEW  Comparison: Chest radiograph performed 03/06/2011  Findings: Diffuse bilateral airspace opacification is again noted; this is more focal, especially at the right lung apex, than previously seen, raising suspicion for multifocal pneumonia. However, underlying mildly worsened vascular congestion and increased interstitial markings suggests a component of pulmonary edema.  Small bilateral pleural effusions are likely present.  No pneumothorax is seen.  The cardiomediastinal silhouette is mildly enlarged.  No acute osseous abnormalities are identified.  IMPRESSION: Suspect pulmonary edema with superimposed multifocal pneumonia; the degree of vascular congestion has mildly worsened, while bilateral airspace opacities are better defined but similar in distribution.  Original Report Authenticated By: Santa Lighter, M.D.   Dg Chest Port 1 View  03/06/2011  *RADIOLOGY REPORT*  Clinical Data: Shortness of breath, CHF  PORTABLE CHEST - 1 VIEW  Comparison:  03/03/2005  Findings: Diffuse asymmetric patchy airspace disease versus edema, worse in the right lung.  Heart is enlarged.  Appearance is compatible with CHF however pneumonia not excluded. No large effusion or pneumothorax.  Atherosclerosis of the aorta and degenerative changes of the spine.  Trachea midline.  IMPRESSION:  Diffuse asymmetric airspace disease versus edema.  Original Report Authenticated By: Jerilynn Mages. Daryll Brod, M.D.    Medications: I have reviewed the patient's current medications.  Assessment/Plan: Patient Active Problem List  Diagnoses  .  Bilateral knee pain  . Knee osteoarthritis  . Diastolic CHF, acute  . Diabetes mellitus  . HTN (hypertension), benign  . Dyslipidemia  . PVD (peripheral vascular disease)  . CVA (cerebral infarction)  . Obesity (BMI 30-39.9)  . Respiratory failure with hypoxia - secondary to acute diastolic heart failure due to non-STEMI  . Chronic renal insufficiency, stage III (moderate)  . Non-ST elevated myocardial infarction (non-STEMI) - inferior lateral ST depression, peak troponin 16  . Fever - documented  101  . Community acquired pneumonia  ANEMIA  Constipation UTI  PLAN:  Acute resp. Failure resolved.  Secondary to CHF- diastolic vs ischemic? On Lasix 80 mg. BID IV - improved edema but continues with truncal edema.   Also  Community acquired PNA.  With fever up to 101.5, now improved. Recheck CXR. On zithromax and rocephin.  NSTEMI with pk Trop. @ 16.55 with pos. Ckmb.  Acute renal failure with cr. Now 1.72 up from 1.50 on admit.   HGB was on admit 11 now 9.2, will recheck and eval. anemia panel, hemoccult stools.  Fever improved on antibiotics.  Rocephin and Zithromax.  UTI as well.  Add stool softner, if KUB stable add dulcolx suppos.  Diabetes, will resume Levemir at lower dose.  Resume diet. Advance as tolerated. EKG without change today. MD to see for further recommendations.          LOS: 2 days   INGOLD,LAURA R 03/08/2011, 9:09 AM     Patient seen and examined. Agree with assessment and plan. Long discussion with daughter.. Pt is s/p nonstemi with inferolateral ST changes and mild posterolateral hypokinesis on echo.  She has diastolic dysfunction and abnormal tissue doppler. CXR still shows significant edema/CHF. Will increase Lasix to 80 mg every 8 hrs.  Abdomen is distended with increased air (not free air). Last BM 4 days ago.  Will give dulcolax supp, lactulose orally.  F/U BMET, BNP. Will decreased ARB with increasing creatinine.  Defer cath until  stable.   Troy Sine, MD, [facility] 03/08/2011 12:39 PM

## 2011-03-08 NOTE — Plan of Care (Signed)
Problem: Phase I Progression Outcomes Goal: Other Phase I Outcomes/Goals Outcome: Progressing Pt NPO after MN for possible cardiac cath on 03/08/11, per Dr. Glenetta Hew

## 2011-03-08 NOTE — Progress Notes (Signed)
Inpatient Diabetes Program Recommendations  AACE/ADA: New Consensus Statement on Inpatient Glycemic Control (2009)  Target Ranges:  Prepandial:   less than 140 mg/dL      Peak postprandial:   less than 180 mg/dL (1-2 hours)      Critically ill patients:  140 - 180 mg/dL   Reason for Visit: Elevated glucose: 214 mg/dL  Inpatient Diabetes Program Recommendations Insulin - Basal: Add Levemir:  Home dose:  36 units in am and 34 units qhs

## 2011-03-08 NOTE — Progress Notes (Signed)
UR Completed.  Luigi Stuckey Jane [phone] 03/08/2011  

## 2011-03-08 NOTE — Progress Notes (Signed)
ANTICOAGULATION CONSULT NOTE - Follow Up Consult  Pharmacy Consult for heparin Indication: chest pain/ACS  No Known Allergies  Patient Measurements: Height: 5\' 1"  (154.9 cm) Weight: 196 lb 13.9 oz (89.3 kg) IBW/kg (Calculated) : 47.8  Adjusted Body Weight:   Vital Signs: Temp: 99.6 F (37.6 C) (11/29 0900) Temp src: Rectal (11/29 0400) BP: 134/64 mmHg (11/29 0937) Pulse Rate: 90  (11/29 0938)  Labs:  Danielle [name] 03/08/11 0432 03/07/11 1848 03/07/11 0542 03/06/11 2110 03/06/11 2015 03/06/11 1047 03/06/11 1037  HGB -- -- 9.2* -- 9.5* -- --  HCT -- -- 29.5* -- 30.2* 35.0* --  PLT -- -- 150 -- 145* -- 146*  APTT -- -- -- -- -- -- 36  LABPROT -- -- -- -- 16.0* -- 14.4  INR -- -- -- -- 1.25 -- 1.10  HEPARINUNFRC 0.44 -- 0.45 0.44 -- -- --  CREATININE 1.72* -- 1.69* -- 1.44* -- --  CKTOTAL 380* 425* 505* -- -- -- --  CKMB 9.8* 12.9* 29.3* -- -- -- --  TROPONINI 3.15* 6.86* 15.69* -- -- -- --   Estimated Creatinine Clearance: 29.2 ml/min (by C-G formula based on Cr of 1.72).   Medications:  Scheduled:    . amLODipine  5 mg Oral Daily  . antiseptic oral rinse  15 mL Mouth Rinse q12n4p  . aspirin EC  81 mg Oral Daily  . azithromycin  500 mg Oral Daily   Followed by  . azithromycin  250 mg Oral Daily  . cefTRIAXone (ROCEPHIN)  IV  1 g Intravenous Q24H  . chlorhexidine  15 mL Mouth Rinse BID  . clopidogrel  75 mg Oral Daily  . docusate sodium  100 mg Oral BID  . furosemide  80 mg Intravenous BID  . insulin aspart  0-15 Units Subcutaneous TID WC  . insulin aspart  0-5 Units Subcutaneous QHS  . metoprolol tartrate  12.5 mg Oral BID  . olmesartan  10 mg Oral Daily  . pantoprazole (PROTONIX) IV  40 mg Intravenous Q24H  . simvastatin  20 mg Oral Daily  . white petrolatum      . DISCONTD: aspirin  324 mg Oral NOW  . DISCONTD: cefTRIAXone  1 g Intramuscular Q24H  . DISCONTD: olmesartan  20 mg Oral Daily   Infusions:    . heparin 10 mL/hr (03/08/11 0600)  . nitroGLYCERIN 10  mcg/min (03/07/11 1600)    Assessment: 76 yo who was admitted for CP/CHF. Awaiting cath but scr cont to rise. Current on IV lasix for diuresis. Heparin remains therapeutic.   Goal of Therapy:  Heparin level 0.3-0.7 units/ml   Plan:  infusion rate 1000 units/hr F/u with cath  Wilfred Lacy 03/08/2011,10:12 AM

## 2011-03-09 LAB — GLUCOSE, CAPILLARY
Glucose-Capillary: 262 mg/dL — ABNORMAL HIGH (ref 70–99)
Glucose-Capillary: 152 mg/dL — ABNORMAL HIGH (ref 70–99)

## 2011-03-09 LAB — COMPREHENSIVE METABOLIC PANEL
ALT: 15 U/L (ref 0–35)
Albumin: 3.3 g/dL — ABNORMAL LOW (ref 3.5–5.2)
Alkaline Phosphatase: 68 U/L (ref 39–117)
GFR calc Af Amer: 29 mL/min — ABNORMAL LOW (ref >90)
Glucose, Bld: 178 mg/dL — ABNORMAL HIGH (ref 70–99)
Potassium: 3.5 mEq/L (ref 3.5–5.1)
Sodium: 144 mEq/L (ref 135–145)
Total Protein: 7.7 g/dL (ref 6.0–8.3)

## 2011-03-09 LAB — HEPARIN LEVEL (UNFRACTIONATED): Heparin Unfractionated: 0.48 IU/mL (ref 0.30–0.70)

## 2011-03-09 LAB — CBC
Hemoglobin: 9.4 g/dL — ABNORMAL LOW (ref 12.0–15.0)
MCHC: 32.2 g/dL (ref 30.0–36.0)
RBC: 3.06 MIL/uL — ABNORMAL LOW (ref 3.87–5.11)
WBC: 9.1 10*3/uL (ref 4.0–10.5)

## 2011-03-09 MED ORDER — PANTOPRAZOLE SODIUM 40 MG PO TBEC
40.0000 mg | DELAYED_RELEASE_TABLET | Freq: Every day | ORAL | Status: DC
  Administered 2011-03-09 – 2011-03-16 (×8): 40 mg via ORAL
  Filled 2011-03-09 (×7): qty 1

## 2011-03-09 MED ORDER — HYDROCOD POLST-CHLORPHEN POLST 10-8 MG/5ML PO LQCR
5.0000 mL | Freq: Two times a day (BID) | ORAL | Status: DC | PRN
  Administered 2011-03-10 (×2): 5 mL via ORAL
  Filled 2011-03-09 (×2): qty 5

## 2011-03-09 NOTE — Progress Notes (Signed)
Subjective: SOB-non productive cough. No chest pain.    Patient  Description  76 y/o [name] with no prior history of CAD or CHF, sent from Primary Care MD office with increasing SOB. On arrival to ED she was in respiratory distress with O2 sats in the 50s. She was treated with IV Lasix and BiPap with improvement. Her family says she has been suffering from URI for past few days. Night of admission symptoms progressed to orthopnea. There is no history from family of chest pain. The patient speaks no [place].  Her daughter Asiedu is acting at family liaison and interpreter.     Objective: Vital signs in last 24 hours: Temp:  [97.9 F (36.6 C)-100.2 F (37.9 C)] 97.9 F (36.6 C) (11/30 0824) Pulse Rate:  [70-93] 84  (11/30 0824) Resp:  [12-29] 15  (11/30 0824) BP: (104-165)/(46-89) 111/53 mmHg (11/30 0824) SpO2:  [84 %-100 %] 97 % (11/30 0824) Weight:  [88.8 kg (195 lb 12.3 oz)] 195 lb 12.3 oz (88.8 kg) (11/30 0442) Weight change: -0.5 kg (-1 lb 1.6 oz) Last BM Date: 03/08/11 Intake/Output from previous day:   -772.9 11/29 0701 - 11/30 0700 In: 1030.1 [P.O.:600; I.V.:362.1; IV Piggyback:68] Out: [id number] [Urine:1800; Emesis/NG output:3] Intake/Output this shift: Total I/O In: -  Out: 625 [Urine:625]  PE: GENERAL:almost flat in bed & breathing without difficulty.  HEART:rrr no gross murmur LUNGS:coarse BS r>l. Diffuse wheezing [id number] non tender EXT:no edema  mucous membranes dry Lab Results:  Basename 03/09/11 0418 03/08/11 1117  WBC 9.1 9.5  HGB 9.4* 9.9*  HCT 29.2* 30.5*  PLT 181 165   BMET  Basename 03/09/11 0418 03/08/11 0432  NA 144 140  K 3.5 3.4*  CL 99 100  CO2 34* 25  GLUCOSE 178* 214*  BUN 41* 34*  CREATININE 1.92* 1.72*  CALCIUM 8.7 8.9    Basename 03/08/11 0432 03/07/11 1848  TROPONINI 3.15* 6.86*    Lab Results  Component Value Date   CHOL 116 03/07/2011   HDL 60 03/07/2011   LDLCALC 36 03/07/2011   TRIG 100 03/07/2011   CHOLHDL 1.9  03/07/2011   Lab Results  Component Value Date   HGBA1C 6.6* 03/06/2011     Lab Results  Component Value Date   TSH 0.683 03/06/2011    Hepatic Function Panel  Basename 03/09/11 0418  PROT 7.7  ALBUMIN 3.3*  AST 30  ALT 15  ALKPHOS 68  BILITOT 0.5  BILIDIR --  IBILI --    Basename 03/07/11 0542  CHOL 116   No results found for this basename: PROTIME in the last 72 hours    EKG: Orders placed during the hospital encounter of 03/06/11  . EKG 12-LEAD  . EKG 12-LEAD    Studies/Results: Dg Chest Port 1 View  03/08/2011  *RADIOLOGY REPORT*  Clinical Data: Short of breath, pneumonia  PORTABLE CHEST - 1 VIEW  Comparison: 03/07/2011  Findings: Diffuse bilateral airspace disease, right greater than left.  There is improvement in left sided airspace disease, no significant change on the right.  This may be due to edema or pneumonia.  No significant effusion.  Cardiac enlargement is present.  IMPRESSION: Improvement in left sided airspace disease.  No change in diffuse right sided airspace disease.  This is most likely due to  heart failure and edema however pneumonia is possible.  Original Report Authenticated By: Truett Perna, M.D.   Dg Chest Port 1 View  03/07/2011  *RADIOLOGY REPORT*  Clinical Data: Follow  up pulmonary edema/pneumonia.  PORTABLE CHEST - 1 VIEW  Comparison: Chest radiograph performed 03/06/2011  Findings: Diffuse bilateral airspace opacification is again noted; this is more focal, especially at the right lung apex, than previously seen, raising suspicion for multifocal pneumonia. However, underlying mildly worsened vascular congestion and increased interstitial markings suggests a component of pulmonary edema.  Small bilateral pleural effusions are likely present.  No pneumothorax is seen.  The cardiomediastinal silhouette is mildly enlarged.  No acute osseous abnormalities are identified.  IMPRESSION: Suspect pulmonary edema with superimposed multifocal pneumonia;  the degree of vascular congestion has mildly worsened, while bilateral airspace opacities are better defined but similar in distribution.  Original Report Authenticated By: Santa Lighter, M.D.   Dg Abd Portable 1v  03/08/2011  *RADIOLOGY REPORT*  Clinical Data: Abdominal pain and distention  ABDOMEN - 1 VIEW  Comparison: CT 03/03/2005  Findings: Motion degraded study.  Negative for bowel obstruction. Foley catheter in the bladder.  Gallbladder clips are present from prior cholecystectomy.  No acute bony abnormality.  IMPRESSION: Negative for bowel obstruction.  Original Report Authenticated By: Truett Perna, M.D.    Medications: I have reviewed the patient's current medications.  Assessment/Plan: Patient Active Problem List  Diagnoses  . Bilateral knee pain  . Knee osteoarthritis  . Diastolic CHF, acute  . Diabetes mellitus  . HTN (hypertension), benign  . Dyslipidemia  . PVD (peripheral vascular disease)  . CVA (cerebral infarction)  . Obesity (BMI 30-39.9)  . Respiratory failure with hypoxia - secondary to acute diastolic heart failure due to non-STEMI  . Chronic renal insufficiency, stage III (moderate)  . Non-ST elevated myocardial infarction (non-STEMI) - inferior lateral ST depression, peak troponin 16  . Fever - documented  101  . Community acquired pneumonia  . UTI (lower urinary tract infection)   PLAN:   BNP 1479 up from 1057 yest, despite increasing Lasix to Q 8 hours.  Cont. Iv lasix & lasix. Cath delayed until fever issues are resolved. Disc. This with family  LOS: 3 days   INGOLD,LAURA R 03/09/2011, 8:38 AM

## 2011-03-09 NOTE — Progress Notes (Signed)
ANTICOAGULATION CONSULT NOTE - Follow Up Consult  Pharmacy Consult for heparin Indication: chest pain/ACS  No Known Allergies  Patient Measurements: Height: 5\' 1"  (154.9 cm) Weight: 195 lb 12.3 oz (88.8 kg) IBW/kg (Calculated) : 47.8  Adjusted Body Weight:   Vital Signs: Temp: 97.9 F (36.6 C) (11/30 0824) Temp src: Oral (11/30 0824) BP: 111/53 mmHg (11/30 0824) Pulse Rate: 84  (11/30 0824)  Labs:  Basename 03/09/11 0418 03/08/11 1117 03/08/11 0432 03/07/11 1848 03/07/11 0542 03/06/11 2015 03/06/11 1037  HGB 9.4* 9.9* -- -- -- -- --  HCT 29.2* 30.5* -- -- 29.5* -- --  PLT 181 165 -- -- 150 -- --  APTT -- -- -- -- -- -- 36  LABPROT -- -- -- -- -- 16.0* 14.4  INR -- -- -- -- -- 1.25 1.10  HEPARINUNFRC 0.48 -- 0.44 -- 0.45 -- --  CREATININE 1.92* -- 1.72* -- 1.69* -- --  CKTOTAL -- -- 380* 425* 505* -- --  CKMB -- -- 9.8* 12.9* 29.3* -- --  TROPONINI -- -- 3.15* 6.86* 15.69* -- --   Estimated Creatinine Clearance: 26.1 ml/min (by C-G formula based on Cr of 1.92).   Medications:  Scheduled:    . amLODipine  5 mg Oral Daily  . antiseptic oral rinse  15 mL Mouth Rinse q12n4p  . aspirin EC  81 mg Oral Daily  . azithromycin  250 mg Oral Daily  . cefTRIAXone (ROCEPHIN)  IV  1 g Intravenous Q24H  . chlorhexidine  15 mL Mouth Rinse BID  . clopidogrel  75 mg Oral Daily  . docusate sodium  100 mg Oral BID  . furosemide  80 mg Intravenous TID  . insulin aspart  0-15 Units Subcutaneous TID WC  . insulin aspart  0-5 Units Subcutaneous QHS  . insulin detemir  15 Units Subcutaneous BID  . lactulose  20 g Oral Daily  . metoprolol tartrate  12.5 mg Oral BID  . olmesartan  10 mg Oral Daily  . pantoprazole (PROTONIX) IV  40 mg Intravenous Q24H  . potassium chloride  20 mEq Oral Daily  . simvastatin  20 mg Oral Daily  . white petrolatum      . DISCONTD: furosemide  80 mg Intravenous BID  . DISCONTD: olmesartan  20 mg Oral Daily   Infusions:    . heparin 1,000 Units/hr  (03/09/11 0700)  . nitroGLYCERIN 10 mcg/min (03/09/11 0700)    Assessment: 76 yo who was admitted to r/o MI. She has been on heparin since admission. Her level remains therapeutic. Awaiting cath when hemodynamically stable.   Goal of Therapy:  Heparin level 0.3-0.7 units/ml   Plan:  infusion rate 1000 units/hr  Onnie Boer [place] 03/09/2011,9:02 AM

## 2011-03-10 ENCOUNTER — Inpatient Hospital Stay (HOSPITAL_COMMUNITY): Payer: Medicare Other

## 2011-03-10 LAB — CBC
Hemoglobin: 9.5 g/dL — ABNORMAL LOW (ref 12.0–15.0)
RBC: 3.21 MIL/uL — ABNORMAL LOW (ref 3.87–5.11)

## 2011-03-10 LAB — GLUCOSE, CAPILLARY
Glucose-Capillary: 154 mg/dL — ABNORMAL HIGH (ref 70–99)
Glucose-Capillary: 177 mg/dL — ABNORMAL HIGH (ref 70–99)

## 2011-03-10 LAB — BASIC METABOLIC PANEL
CO2: 36 mEq/L — ABNORMAL HIGH (ref 19–32)
Chloride: 97 mEq/L (ref 96–112)
Glucose, Bld: 169 mg/dL — ABNORMAL HIGH (ref 70–99)
Potassium: 3.6 mEq/L (ref 3.5–5.1)
Sodium: 142 mEq/L (ref 135–145)

## 2011-03-10 LAB — HEPARIN LEVEL (UNFRACTIONATED): Heparin Unfractionated: 0.71 IU/mL — ABNORMAL HIGH (ref 0.30–0.70)

## 2011-03-10 MED ORDER — POLYSACCHARIDE IRON 150 MG PO CAPS
150.0000 mg | ORAL_CAPSULE | Freq: Every day | ORAL | Status: DC
  Administered 2011-03-10 – 2011-03-17 (×7): 150 mg via ORAL
  Filled 2011-03-10 (×8): qty 1

## 2011-03-10 MED ORDER — PSYLLIUM 95 % PO PACK
1.0000 | PACK | Freq: Every day | ORAL | Status: DC
  Administered 2011-03-10 – 2011-03-12 (×3): 1 via ORAL
  Filled 2011-03-10 (×3): qty 1

## 2011-03-10 MED ORDER — HEPARIN SOD (PORCINE) IN D5W 100 UNIT/ML IV SOLN
950.0000 [IU]/h | INTRAVENOUS | Status: DC
  Filled 2011-03-10: qty 250

## 2011-03-10 NOTE — Plan of Care (Signed)
Problem: Phase II Progression Outcomes Goal: Discharge plan established Outcome: Progressing Probably home with family assistance Goal: Tolerating diet Outcome: Not Progressing On clear liquids. Continues with nausea requiring medication.

## 2011-03-10 NOTE — Progress Notes (Signed)
ANTICOAGULATION CONSULT NOTE - Follow Up Consult  Pharmacy Consult for heparin Indication: chest pain/ACS  No Known Allergies  Patient Measurements: Height: 5\' 1"  (154.9 cm) Weight: 194 lb 0.1 oz (88 kg) IBW/kg (Calculated) : 47.8  Adjusted Body Weight:   Vital Signs: Temp: 98.5 F (36.9 C) (12/01 1200) Temp src: Oral (12/01 1200) BP: 127/107 mmHg (12/01 1237) Pulse Rate: 72  (12/01 1237)  Labs:  Basename 03/10/11 0500 03/09/11 0418 03/08/11 1117 03/08/11 0432 03/07/11 1848  HGB 9.5* 9.4* -- -- --  HCT 30.6* 29.2* 30.5* -- --  PLT 195 181 165 -- --  APTT -- -- -- -- --  LABPROT -- -- -- -- --  INR -- -- -- -- --  HEPARINUNFRC 0.71* 0.48 -- 0.44 --  CREATININE 1.76* 1.92* -- 1.72* --  CKTOTAL -- -- -- 380* 425*  CKMB -- -- -- 9.8* 12.9*  TROPONINI -- -- -- 3.15* 6.86*   Estimated Creatinine Clearance: 28.3 ml/min (by C-G formula based on Cr of 1.76).   Medications:  Scheduled:     . amLODipine  5 mg Oral Daily  . antiseptic oral rinse  15 mL Mouth Rinse q12n4p  . aspirin EC  81 mg Oral Daily  . azithromycin  250 mg Oral Daily  . cefTRIAXone (ROCEPHIN)  IV  1 g Intravenous Q24H  . chlorhexidine  15 mL Mouth Rinse BID  . clopidogrel  75 mg Oral Daily  . docusate sodium  100 mg Oral BID  . furosemide  80 mg Intravenous TID  . insulin aspart  0-15 Units Subcutaneous TID WC  . insulin aspart  0-5 Units Subcutaneous QHS  . insulin detemir  15 Units Subcutaneous BID  . lactulose  20 g Oral Daily  . metoprolol tartrate  12.5 mg Oral BID  . pantoprazole  40 mg Oral Q1200  . polysaccharide iron  150 mg Oral Daily  . potassium chloride  20 mEq Oral Daily  . psyllium  1 packet Oral Daily  . simvastatin  20 mg Oral Daily   Infusions:     . heparin    . nitroGLYCERIN 10 mcg/min (03/10/11 0500)  . DISCONTD: heparin 1,000 Units/hr (03/10/11 1000)    Assessment: 76 yo who was admitted to r/o MI. She has been on heparin since admission. Her level  Is at the upper  limit of goal range without noted bleeding complications.  Awaiting cath when hemodynamically stable.   Goal of Therapy:  Heparin level 0.3-0.7 units/ml   Plan:  Will decrease Heparin rate slightly to avoid overshooting target to 950 units/hr. F/U am labs Monitor for bleeding complications.  Rober Minion, PharmD, MS 03/10/2011,3:35 PM  [phone]

## 2011-03-10 NOTE — Progress Notes (Signed)
Subjective: Short of breath.  Objective: Vital signs in last 24 hours: Temp:  [97.8 F (36.6 C)-99.9 F (37.7 C)] 99.2 F (37.3 C) (12/01 0827) Pulse Rate:  [68-82] 71  (12/01 0500) Resp:  [12-26] 12  (12/01 0500) BP: (104-126)/(41-75) 104/41 mmHg (12/01 0500) SpO2:  [97 %-100 %] 100 % (12/01 0500) Weight:  [88 kg (194 lb 0.1 oz)] 194 lb 0.1 oz (88 kg) (12/01 0028) Weight change: -0.8 kg (-1 lb 12.2 oz) Last BM Date: 03/08/11 Intake/Output from previous day:  -772.9  But -2712 last 8 hours. 11/30 0701 - 12/01 0700 In: -  Out: 2725 [Urine:2725] Intake/Output this shift: Total I/O In: -  Out: 850 [Urine:850]  PE: General:awake, obviously ill Heart: S1S2 RRR. No obvious murmur. Lungs:  + rhonchi Abd: +bs, non tender. Ext:no edema  Lab Results:  Basename 03/10/11 0500 03/09/11 0418  WBC 8.1 9.1  HGB 9.5* 9.4*  HCT 30.6* 29.2*  PLT 195 181   BMET  Basename 03/10/11 0500 03/09/11 0418  NA 142 144  K 3.6 3.5  CL 97 99  CO2 36* 34*  GLUCOSE 169* 178*  BUN 42* 41*  CREATININE 1.76* 1.92*  CALCIUM 8.8 8.7    Basename 03/08/11 0432 03/07/11 1848  TROPONINI 3.15* 6.86*    Lab Results  Component Value Date   CHOL 116 03/07/2011   HDL 60 03/07/2011   LDLCALC 36 03/07/2011   TRIG 100 03/07/2011   CHOLHDL 1.9 03/07/2011   Lab Results  Component Value Date   HGBA1C 6.6* 03/06/2011     Lab Results  Component Value Date   TSH 0.683 03/06/2011    Hepatic Function Panel  Basename 03/09/11 0418  PROT 7.7  ALBUMIN 3.3*  AST 30  ALT 15  ALKPHOS 68  BILITOT 0.5  BILIDIR --  IBILI --   No results found for this basename: CHOL in the last 72 hours No results found for this basename: PROTIME in the last 72 hours    EKG: Orders placed during the hospital encounter of 03/06/11  . EKG 12-LEAD  . EKG 12-LEAD  . EKG 12-LEAD  . EKG 12-LEAD    Studies/Results: Dg Chest Port 1 View  03/10/2011  *RADIOLOGY REPORT*  Clinical Data: CHF  PORTABLE CHEST - 1  VIEW  Comparison: 03/08/2011  Findings: Heart remains enlarged with some improvement in asymmetric bilateral airspace disease versus edema.  No enlarging effusion or pneumothorax.  Trachea midline.  IMPRESSION: Cardiomegaly with some interval improvement in asymmetric edema pattern.  Original Report Authenticated By: Jerilynn Mages. Daryll Brod, M.D.   Dg Chest Port 1 View  03/08/2011  *RADIOLOGY REPORT*  Clinical Data: Short of breath, pneumonia  PORTABLE CHEST - 1 VIEW  Comparison: 03/07/2011  Findings: Diffuse bilateral airspace disease, right greater than left.  There is improvement in left sided airspace disease, no significant change on the right.  This may be due to edema or pneumonia.  No significant effusion.  Cardiac enlargement is present.  IMPRESSION: Improvement in left sided airspace disease.  No change in diffuse right sided airspace disease.  This is most likely due to  heart failure and edema however pneumonia is possible.  Original Report Authenticated By: Truett Perna, M.D.   Dg Abd Portable 1v  03/08/2011  *RADIOLOGY REPORT*  Clinical Data: Abdominal pain and distention  ABDOMEN - 1 VIEW  Comparison: CT 03/03/2005  Findings: Motion degraded study.  Negative for bowel obstruction. Foley catheter in the bladder.  Gallbladder clips are present  from prior cholecystectomy.  No acute bony abnormality.  IMPRESSION: Negative for bowel obstruction.  Original Report Authenticated By: Truett Perna, M.D.    Medications: I have reviewed the patient's current medications.  Assessment/Plan: Patient Active Problem List  Diagnoses  . Bilateral knee pain  . Knee osteoarthritis  . Diastolic CHF, acute  . Diabetes mellitus  . HTN (hypertension), benign  . Dyslipidemia  . PVD (peripheral vascular disease)  . CVA (cerebral infarction)  . Obesity (BMI 30-39.9)  . Respiratory failure with hypoxia - secondary to acute diastolic heart failure due to non-STEMI  . Chronic renal insufficiency, stage III  (moderate)  . Non-ST elevated myocardial infarction (non-STEMI) - inferior lateral ST depression, peak troponin 16  . Fever - documented  101  . Community acquired pneumonia  . UTI (lower urinary tract infection)     PLAN:  CXR with improvement, mild improvement in Cr. After stopping Benicar.  Continues with low grade fever on rocephin and zithromax.   IV Heparin and NTG continue may need to continue until cardiac cath which we hope we can proceed with on Monday depending on resp. Status and renal status.   Continue to diuresis on Lasix 80 mg IV Q 8 hr.  BNP down from yesterdays.  Her anemia is Iron def. Continue to wait on hemoccult. Begin niferex.    LOS: 4 days   INGOLD,LAURA R 03/10/2011, 9:14 AM   Agree with note written by Cecilie Kicks RNP  Admitted with CHF and NSTEMI. Clinically improving with diuresis. In IV hep/ntg. Denies CP (grand daughter was in room as interpreter). Exam notable for left basilar crackles. Peripheral edema has resolved. Peak trop about 15. BNP decreasing. Cont. current medical therapy. EF nl by 2D. Plan is for cath Monday if renal OK and continues to clinically improve and lye flat  .Lorretta Harp 03/10/2011 9:28 AM

## 2011-03-11 LAB — CBC
HCT: 31.7 % — ABNORMAL LOW (ref 36.0–46.0)
Hemoglobin: 10 g/dL — ABNORMAL LOW (ref 12.0–15.0)
MCH: 29.5 pg (ref 26.0–34.0)
MCHC: 31.5 g/dL (ref 30.0–36.0)
RBC: 3.39 MIL/uL — ABNORMAL LOW (ref 3.87–5.11)

## 2011-03-11 LAB — BASIC METABOLIC PANEL
BUN: 44 mg/dL — ABNORMAL HIGH (ref 6–23)
Chloride: 93 mEq/L — ABNORMAL LOW (ref 96–112)
GFR calc Af Amer: 36 mL/min — ABNORMAL LOW (ref >90)
Glucose, Bld: 214 mg/dL — ABNORMAL HIGH (ref 70–99)
Potassium: 3.5 mEq/L (ref 3.5–5.1)

## 2011-03-11 LAB — GLUCOSE, CAPILLARY: Glucose-Capillary: 251 mg/dL — ABNORMAL HIGH (ref 70–99)

## 2011-03-11 LAB — HEPARIN LEVEL (UNFRACTIONATED)
Heparin Unfractionated: 1.23 IU/mL — ABNORMAL HIGH (ref 0.30–0.70)
Heparin Unfractionated: 0.79 IU/mL — ABNORMAL HIGH (ref 0.30–0.70)

## 2011-03-11 LAB — PRO B NATRIURETIC PEPTIDE: Pro B Natriuretic peptide (BNP): 707 pg/mL — ABNORMAL HIGH (ref 0–125)

## 2011-03-11 MED ORDER — HEPARIN SOD (PORCINE) IN D5W 100 UNIT/ML IV SOLN: 650.0000 [IU]/h | INTRAVENOUS | Status: DC

## 2011-03-11 MED ORDER — SODIUM CHLORIDE 0.9 % IJ SOLN
3.0000 mL | Freq: Two times a day (BID) | INTRAMUSCULAR | Status: DC
  Administered 2011-03-11 – 2011-03-12 (×2): 3 mL via INTRAVENOUS

## 2011-03-11 MED ORDER — SODIUM CHLORIDE 0.9 % IV SOLN: INTRAVENOUS | Status: DC

## 2011-03-11 MED ORDER — SODIUM CHLORIDE 0.9 % IJ SOLN: 3.0000 mL | INTRAMUSCULAR | Status: DC | PRN

## 2011-03-11 MED ORDER — SODIUM CHLORIDE 0.9 % IV SOLN: 250.0000 mL | INTRAVENOUS | Status: DC | PRN

## 2011-03-11 MED ORDER — ASPIRIN 81 MG PO CHEW
324.0000 mg | CHEWABLE_TABLET | ORAL | Status: AC
  Administered 2011-03-12: 324 mg via ORAL
  Filled 2011-03-11: qty 4

## 2011-03-11 MED ORDER — HEPARIN SOD (PORCINE) IN D5W 100 UNIT/ML IV SOLN
750.0000 [IU]/h | INTRAVENOUS | Status: DC
  Filled 2011-03-11: qty 250

## 2011-03-11 MED ORDER — INSULIN ASPART 100 UNIT/ML ~~LOC~~ SOLN
0.0000 [IU] | SUBCUTANEOUS | Status: DC
  Administered 2011-03-12: 7 [IU] via SUBCUTANEOUS
  Administered 2011-03-12: 5 [IU] via SUBCUTANEOUS
  Administered 2011-03-12 – 2011-03-13 (×4): 3 [IU] via SUBCUTANEOUS
  Administered 2011-03-13: 9 [IU] via SUBCUTANEOUS
  Administered 2011-03-13: 3 [IU] via SUBCUTANEOUS
  Filled 2011-03-11: qty 3

## 2011-03-11 NOTE — Progress Notes (Signed)
Subjective:  No CP/SOB  Objective:  Temp:  [98 F (36.7 C)-99.4 F (37.4 C)] 99.4 F (37.4 C) (12/02 0819) Pulse Rate:  [58-82] 73  (12/02 1030) Resp:  [11-22] 13  (12/02 1030) BP: (105-146)/(41-107) 146/67 mmHg (12/02 1000) SpO2:  [70 %-100 %] 99 % (12/02 1030) Weight:  [85 kg (187 lb 6.3 oz)] 187 lb 6.3 oz (85 kg) (12/02 0500) Weight change: -3 kg (-6 lb 9.8 oz)  Intake/Output from previous day: 12/01 0701 - 12/02 0700 In: 1908.5 [P.O.:1640; I.V.:218.5; IV Piggyback:50] Out: 3800 [Urine:3800]  Intake/Output from this shift: Total I/O In: 533 [P.O.:480; I.V.:53] Out: 250 [Urine:250]  Physical Exam: General appearance: alert and cooperative Neck: no adenopathy, no carotid bruit, no JVD, supple, symmetrical, trachea midline and thyroid not enlarged, symmetric, no tenderness/mass/nodules Lungs: wheezes bilaterally Heart: regular rate and rhythm, S1, S2 normal, no murmur, click, rub or gallop Abdomen: soft, non-tender; bowel sounds normal; no masses,  no organomegaly Extremities: extremities normal, atraumatic, no cyanosis or edema  Lab Results: Results for orders placed during the hospital encounter of 03/06/11 (from the past 48 hour(s))  GLUCOSE, CAPILLARY     Status: Abnormal   Collection Time   03/09/11 12:04 PM      Component Value Range Comment   Glucose-Capillary 262 (*) 70 - 99 (mg/dL)   GLUCOSE, CAPILLARY     Status: Abnormal   Collection Time   03/09/11  4:42 PM      Component Value Range Comment   Glucose-Capillary 152 (*) 70 - 99 (mg/dL)   GLUCOSE, CAPILLARY     Status: Abnormal   Collection Time   03/09/11 10:16 PM      Component Value Range Comment   Glucose-Capillary 152 (*) 70 - 99 (mg/dL)    Comment 1 Documented in Chart      Comment 2 Notify RN     HEPARIN LEVEL (UNFRACTIONATED)     Status: Abnormal   Collection Time   03/10/11  5:00 AM      Component Value Range Comment   Heparin Unfractionated 0.71 (*) 0.30 - 0.70 (IU/mL)   CBC     Status:  Abnormal   Collection Time   03/10/11  5:00 AM      Component Value Range Comment   WBC 8.1  4.0 - 10.5 (K/uL)    RBC 3.21 (*) 3.87 - 5.11 (MIL/uL)    Hemoglobin 9.5 (*) 12.0 - 15.0 (g/dL)    HCT 30.6 (*) 36.0 - 46.0 (%)    MCV 95.3  78.0 - 100.0 (fL)    MCH 29.6  26.0 - 34.0 (pg)    MCHC 31.0  30.0 - 36.0 (g/dL)    RDW 12.8  11.5 - 15.5 (%)    Platelets 195  150 - 400 (K/uL)   BASIC METABOLIC PANEL     Status: Abnormal   Collection Time   03/10/11  5:00 AM      Component Value Range Comment   Sodium 142  135 - 145 (mEq/L)    Potassium 3.6  3.5 - 5.1 (mEq/L)    Chloride 97  96 - 112 (mEq/L)    CO2 36 (*) 19 - 32 (mEq/L)    Glucose, Bld 169 (*) 70 - 99 (mg/dL)    BUN 42 (*) 6 - 23 (mg/dL)    Creatinine, Ser 1.76 (*) 0.50 - 1.10 (mg/dL)    Calcium 8.8  8.4 - 10.5 (mg/dL)    GFR calc non Af Amer 27 (*) >90 (  mL/min)    GFR calc Af Amer 32 (*) >90 (mL/min)   GLUCOSE, CAPILLARY     Status: Abnormal   Collection Time   03/10/11  8:26 AM      Component Value Range Comment   Glucose-Capillary 154 (*) 70 - 99 (mg/dL)    Comment 1 Documented in Chart      Comment 2 Notify RN     GLUCOSE, CAPILLARY     Status: Abnormal   Collection Time   03/10/11 12:35 PM      Component Value Range Comment   Glucose-Capillary 239 (*) 70 - 99 (mg/dL)    Comment 1 Documented in Chart      Comment 2 Notify RN     GLUCOSE, CAPILLARY     Status: Abnormal   Collection Time   03/10/11  4:40 PM      Component Value Range Comment   Glucose-Capillary 237 (*) 70 - 99 (mg/dL)    Comment 1 Documented in Chart      Comment 2 Notify RN     GLUCOSE, CAPILLARY     Status: Abnormal   Collection Time   03/10/11  9:20 PM      Component Value Range Comment   Glucose-Capillary 177 (*) 70 - 99 (mg/dL)   PRO B NATRIURETIC PEPTIDE     Status: Abnormal   Collection Time   03/11/11  5:00 AM      Component Value Range Comment   BNP, POC 707.0 (*) 0 - 125 (pg/mL)   HEPARIN LEVEL (UNFRACTIONATED)     Status: Abnormal    Collection Time   03/11/11  6:00 AM      Component Value Range Comment   Heparin Unfractionated 1.23 (*) 0.30 - 0.70 (IU/mL)   BASIC METABOLIC PANEL     Status: Abnormal   Collection Time   03/11/11  6:00 AM      Component Value Range Comment   Sodium 138  135 - 145 (mEq/L)    Potassium 3.5  3.5 - 5.1 (mEq/L)    Chloride 93 (*) 96 - 112 (mEq/L)    CO2 35 (*) 19 - 32 (mEq/L)    Glucose, Bld 214 (*) 70 - 99 (mg/dL)    BUN 44 (*) 6 - 23 (mg/dL)    Creatinine, Ser 1.60 (*) 0.50 - 1.10 (mg/dL)    Calcium 8.9  8.4 - 10.5 (mg/dL)    GFR calc non Af Amer 31 (*) >90 (mL/min)    GFR calc Af Amer 36 (*) >90 (mL/min)   CBC     Status: Abnormal   Collection Time   03/11/11  6:00 AM      Component Value Range Comment   WBC 8.4  4.0 - 10.5 (K/uL)    RBC 3.39 (*) 3.87 - 5.11 (MIL/uL)    Hemoglobin 10.0 (*) 12.0 - 15.0 (g/dL)    HCT 31.7 (*) 36.0 - 46.0 (%)    MCV 93.5  78.0 - 100.0 (fL)    MCH 29.5  26.0 - 34.0 (pg)    MCHC 31.5  30.0 - 36.0 (g/dL)    RDW 12.5  11.5 - 15.5 (%)    Platelets 209  150 - 400 (K/uL)   MAGNESIUM     Status: Normal   Collection Time   03/11/11  6:00 AM      Component Value Range Comment   Magnesium 2.1  1.5 - 2.5 (mg/dL)   GLUCOSE, CAPILLARY     Status: Abnormal  Collection Time   03/11/11  8:22 AM      Component Value Range Comment   Glucose-Capillary 211 (*) 70 - 99 (mg/dL)    Comment 1 Documented in Chart      Comment 2 Notify RN       Imaging: Imaging results have been reviewed  Assessment/Plan:   1. Principal Problem: 2.  *Respiratory failure with hypoxia - secondary to acute diastolic heart failure due to non-STEMI 3. Active Problems: 4.  Diastolic CHF, acute 5.  Diabetes mellitus 6.  HTN (hypertension), benign 7.  Dyslipidemia 8.  PVD (peripheral vascular disease) 9.  CVA (cerebral infarction) 10.  Obesity (BMI 30-39.9) 11.  Chronic renal insufficiency, stage III (moderate) 12.  Non-ST elevated myocardial infarction (non-STEMI) - inferior  lateral ST depression, peak troponin 16 13.  Fever - documented  101 14.  Community acquired pneumonia 36.  UTI (lower urinary tract infection) 16.   Time Spent Directly with Patient:  20 minutes  Length of Stay:  LOS: 5 days   Pt is diuresing. Less SOB. Wheezes on exam. On IV ATBX. NSTEMI. Will check labs, BNP and PCXR. Anticipate cardiac cath tomorrow. Discussed with daughter (who speaks English and is willing to act as an interpreter).Lorretta Harp 03/11/2011, 10:58 AM

## 2011-03-11 NOTE — Progress Notes (Signed)
ANTICOAGULATION CONSULT NOTE - Follow Up Consult  Pharmacy Consult for Heparin Indication: Chest pain/ACS  No Known Allergies  Patient Measurements: Height: 5\' 1"  (154.9 cm) Weight: 187 lb 6.3 oz (85 kg) IBW/kg (Calculated) : 47.8  Adjusted Body Weight:   Vital Signs: Temp: 98.3 F (36.8 C) (12/02 2000) Temp src: Oral (12/02 2000) BP: 130/57 mmHg (12/02 2207) Pulse Rate: 70  (12/02 2207)  Labs:  Basename 03/11/11 1640 03/11/11 0600 03/10/11 0500 03/09/11 0418  HGB -- 10.0* 9.5* --  HCT -- 31.7* 30.6* 29.2*  PLT -- 209 195 181  APTT -- -- -- --  LABPROT -- -- -- --  INR -- -- -- --  HEPARINUNFRC 0.79* 1.23* 0.71* --  CREATININE -- 1.60* 1.76* 1.92*  CKTOTAL -- -- -- --  CKMB -- -- -- --  TROPONINI -- -- -- --   Estimated Creatinine Clearance: 30.5 ml/min (by C-G formula based on Cr of 1.6).   Medications:  Scheduled:     . amLODipine  5 mg Oral Daily  . antiseptic oral rinse  15 mL Mouth Rinse q12n4p  . aspirin  324 mg Oral Pre-Cath  . aspirin EC  81 mg Oral Daily  . azithromycin  250 mg Oral Daily  . cefTRIAXone (ROCEPHIN)  IV  1 g Intravenous Q24H  . chlorhexidine  15 mL Mouth Rinse BID  . clopidogrel  75 mg Oral Daily  . docusate sodium  100 mg Oral BID  . furosemide  80 mg Intravenous TID  . insulin aspart  0-15 Units Subcutaneous TID WC  . insulin aspart  0-5 Units Subcutaneous QHS  . insulin aspart  0-9 Units Subcutaneous Q4H  . insulin detemir  15 Units Subcutaneous BID  . lactulose  20 g Oral Daily  . metoprolol tartrate  12.5 mg Oral BID  . pantoprazole  40 mg Oral Q1200  . polysaccharide iron  150 mg Oral Daily  . potassium chloride  20 mEq Oral Daily  . psyllium  1 packet Oral Daily  . simvastatin  20 mg Oral Daily  . sodium chloride  3 mL Intravenous Q12H   Infusions:     . sodium chloride    . heparin 750 Units/hr (03/11/11 1600)  . nitroGLYCERIN 20 mcg/min (03/11/11 1900)  . DISCONTD: heparin 950 Units (03/10/11 1900)     Assessment: 76 yo who was admitted to r/o MI. She has been on heparin since admission. Heparin level =0.79 and decreased since last level of 1.23.  Goal of Therapy:  Heparin level 0.3-0.7 units/ml   Plan:  -Reduce rate to 650 units/hr. -Recheck a Xa level in am  Hildred Laser, Pharm D 03/11/2011 10:50 PM

## 2011-03-11 NOTE — Plan of Care (Signed)
Problem: Phase I Progression Outcomes Goal: Other Phase I Outcomes/Goals  .  Problem: Phase II Progression Outcomes Goal: Case manager referral Outcome: Progressing Ordered 03/10/11  Comments:  Assist with discharge to home after results of cardiac cath 03/11/11.

## 2011-03-11 NOTE — Progress Notes (Signed)
Called to start 2 new ivs for pt; pt has 1 iv that is outdated; attempted x 2 in left arm without success; unable to thread catheters into veins; very poor access; edema noted; current site still working;  Will have another IV Team RN assess when able;

## 2011-03-11 NOTE — Progress Notes (Signed)
ANTICOAGULATION CONSULT NOTE - Follow Up Consult  Pharmacy Consult for Heparin Indication: Chest pain/ACS  No Known Allergies  Patient Measurements: Height: 5\' 1"  (154.9 cm) Weight: 187 lb 6.3 oz (85 kg) IBW/kg (Calculated) : 47.8  Adjusted Body Weight:   Vital Signs: Temp: 98.5 F (36.9 C) (12/02 0500) Temp src: Oral (12/02 0500) BP: 112/41 mmHg (12/02 0500) Pulse Rate: 78  (12/02 0500)  Labs:  Basename 03/11/11 0600 03/10/11 0500 03/09/11 0418  HGB 10.0* 9.5* --  HCT 31.7* 30.6* 29.2*  PLT 209 195 181  APTT -- -- --  LABPROT -- -- --  INR -- -- --  HEPARINUNFRC 1.23* 0.71* 0.48  CREATININE 1.60* 1.76* 1.92*  CKTOTAL -- -- --  CKMB -- -- --  TROPONINI -- -- --   Estimated Creatinine Clearance: 30.5 ml/min (by C-G formula based on Cr of 1.6).   Medications:  Scheduled:     . amLODipine  5 mg Oral Daily  . antiseptic oral rinse  15 mL Mouth Rinse q12n4p  . aspirin EC  81 mg Oral Daily  . azithromycin  250 mg Oral Daily  . cefTRIAXone (ROCEPHIN)  IV  1 g Intravenous Q24H  . chlorhexidine  15 mL Mouth Rinse BID  . clopidogrel  75 mg Oral Daily  . docusate sodium  100 mg Oral BID  . furosemide  80 mg Intravenous TID  . insulin aspart  0-15 Units Subcutaneous TID WC  . insulin aspart  0-5 Units Subcutaneous QHS  . insulin detemir  15 Units Subcutaneous BID  . lactulose  20 g Oral Daily  . metoprolol tartrate  12.5 mg Oral BID  . pantoprazole  40 mg Oral Q1200  . polysaccharide iron  150 mg Oral Daily  . potassium chloride  20 mEq Oral Daily  . psyllium  1 packet Oral Daily  . simvastatin  20 mg Oral Daily   Infusions:     . heparin    . nitroGLYCERIN 20 mcg/min (03/10/11 2226)  . DISCONTD: heparin 1,000 Units/hr (03/10/11 1000)  . DISCONTD: heparin 950 Units (03/10/11 1900)    Assessment: 76 yo who was admitted to r/o MI. She has been on heparin since admission. Her level today has trended upward still despite dose reduction yesterday.  Today Xa =  1.2 units/ml with goal of 0.3-0.7.  Spoke with RN, she states no complications with lines/pump overnight and it has been infusing without problems.  She may be accumulating since she had trended upward yesterday.  There is no noted bleeding complications.  Goal of Therapy:  Heparin level 0.3-0.7 units/ml   Plan:   Will hold Heparin for 30-45 minutes.  Reduce rate to 750 units/hr.  Recheck a Xa level 8 hours after restarting Heparin infusion.  Monitor for bleeding complications.  Rober Minion, PharmD, MS 03/11/2011,7:27 AM  [phone]

## 2011-03-12 ENCOUNTER — Encounter (HOSPITAL_COMMUNITY)
Admission: EM | Disposition: A | Discharge status: Home Health | Payer: Self-pay | Source: Home / Self Care | Attending: Internal Medicine

## 2011-03-12 HISTORY — PX: INTRAVASCULAR ULTRASOUND: SHX5452

## 2011-03-12 HISTORY — PX: LEFT HEART CATHETERIZATION WITH CORONARY ANGIOGRAM: SHX5451

## 2011-03-12 LAB — GLUCOSE, CAPILLARY
Glucose-Capillary: 240 mg/dL — ABNORMAL HIGH (ref 70–99)
Glucose-Capillary: 255 mg/dL — ABNORMAL HIGH (ref 70–99)

## 2011-03-12 LAB — CBC
Platelets: 233 10*3/uL (ref 150–400)
RBC: 3.55 MIL/uL — ABNORMAL LOW (ref 3.87–5.11)
WBC: 9.9 10*3/uL (ref 4.0–10.5)

## 2011-03-12 LAB — PROTIME-INR
INR: 1.12 (ref 0.00–1.49)
Prothrombin Time: 14.6 seconds (ref 11.6–15.2)

## 2011-03-12 LAB — BASIC METABOLIC PANEL
Chloride: 92 mEq/L — ABNORMAL LOW (ref 96–112)
GFR calc Af Amer: 43 mL/min — ABNORMAL LOW (ref >90)
Potassium: 3.4 mEq/L — ABNORMAL LOW (ref 3.5–5.1)

## 2011-03-12 LAB — HEPARIN LEVEL (UNFRACTIONATED): Heparin Unfractionated: 0.36 IU/mL (ref 0.30–0.70)

## 2011-03-12 LAB — PLATELET INHIBITION P2Y12: Platelet Function  P2Y12: 388 [PRU] (ref 194–418)

## 2011-03-12 LAB — POCT ACTIVATED CLOTTING TIME: Activated Clotting Time: 347 seconds

## 2011-03-12 SURGERY — LEFT HEART CATHETERIZATION WITH CORONARY ANGIOGRAM
Anesthesia: LOCAL

## 2011-03-12 MED ORDER — HEPARIN (PORCINE) IN NACL 2-0.9 UNIT/ML-% IJ SOLN
INTRAMUSCULAR | Status: AC
  Filled 2011-03-12: qty 2000

## 2011-03-12 MED ORDER — NITROGLYCERIN 0.2 MG/ML ON CALL CATH LAB
INTRAVENOUS | Status: AC
  Filled 2011-03-12: qty 1

## 2011-03-12 MED ORDER — POTASSIUM CHLORIDE CRYS ER 20 MEQ PO TBCR
40.0000 meq | EXTENDED_RELEASE_TABLET | Freq: Once | ORAL | Status: AC
  Administered 2011-03-12: 40 meq via ORAL
  Filled 2011-03-12: qty 1

## 2011-03-12 MED ORDER — LIDOCAINE HCL (PF) 1 % IJ SOLN
INTRAMUSCULAR | Status: AC
  Filled 2011-03-12: qty 30

## 2011-03-12 MED ORDER — NITROGLYCERIN IN D5W 200-5 MCG/ML-% IV SOLN
6.0000 ug/min | INTRAVENOUS | Status: DC
  Administered 2011-03-12: 6 ug/min via INTRAVENOUS

## 2011-03-12 MED ORDER — HEPARIN SOD (PORCINE) IN D5W 100 UNIT/ML IV SOLN
650.0000 [IU]/h | INTRAVENOUS | Status: DC
  Administered 2011-03-12 – 2011-03-16 (×3): 650 [IU]/h via INTRAVENOUS
  Filled 2011-03-12 (×4): qty 250

## 2011-03-12 MED ORDER — SODIUM CHLORIDE 0.9 % IV SOLN
INTRAVENOUS | Status: AC
  Administered 2011-03-12: 19:00:00 via INTRAVENOUS

## 2011-03-12 MED ORDER — MIDAZOLAM HCL 2 MG/2ML IJ SOLN
INTRAMUSCULAR | Status: AC
  Filled 2011-03-12: qty 2

## 2011-03-12 MED ORDER — CEFAZOLIN SODIUM 1-5 GM-% IV SOLN
INTRAVENOUS | Status: AC
  Filled 2011-03-12: qty 50

## 2011-03-12 NOTE — Plan of Care (Signed)
Problem: Phase I Progression Outcomes Goal: Voiding-avoid urinary catheter unless indicated Outcome: Not Progressing Pt has foley cath

## 2011-03-12 NOTE — Progress Notes (Signed)
Inpatient Diabetes Program Recommendations  AACE/ADA: New Consensus Statement on Inpatient Glycemic Control (2009)  Target Ranges:  Prepandial:   less than 140 mg/dL      Peak postprandial:   less than 180 mg/dL (1-2 hours)      Critically ill patients:  140 - 180 mg/dL   Reason for Visit: Elevated fasting glucose:  234 mg/dL  Inpatient Diabetes Program Recommendations Insulin - Basal: Increase Lantus towards home dose: Consider Lantus 30 units BID

## 2011-03-12 NOTE — Progress Notes (Signed)
Patient ID: Danielle [name], female   DOB: 1935-03-03, 76 y.o.   MRN: [id number]  [facility]  Subjective: No CP, SOB or orthopnea  Objective: Vital signs in last 24 hours: Temp:  [97.6 F (36.4 C)-100 F (37.8 C)] 97.7 F (36.5 C) (12/03 0818) Pulse Rate:  [60-73] 62  (12/03 0955) Resp:  [12-31] 12  (12/03 0955) BP: (106-136)/(51-58) 134/53 mmHg (12/03 0750) SpO2:  [97 %-100 %] 97 % (12/03 0955) Weight:  [83 kg (182 lb 15.7 oz)] 182 lb 15.7 oz (83 kg) (12/03 0500) Last BM Date: 03/11/11  Intake/Output from previous day: 12/02 0701 - 12/03 0700 In: 2132.5 [P.O.:1680; I.V.:444.5; IV Piggyback:8] Out: 2251 [Urine:2250; Stool:1] Intake/Output this shift: Total I/O In: 113 [I.V.:113] Out: 150 [Urine:150]  Medications Current Facility-Administered Medications  Medication Dose Route Frequency Provider Last Rate Last Dose  . 0.9 %  sodium chloride infusion  250 mL Intravenous PRN Isaiah Serge, NP      . 0.9 %  sodium chloride infusion   Intravenous Continuous Isaiah Serge, NP 50 mL/hr at 03/12/11 0432    . acetaminophen (TYLENOL) tablet 650 mg  650 mg Oral Q4H PRN Erlene Quan, PA      . acetaminophen (TYLENOL) tablet 650 mg  650 mg Oral Q6H PRN Leonie Man   650 mg at 03/08/11 0053  . ALPRAZolam Duanne Moron) tablet 0.25 mg  0.25 mg Oral BID PRN Erlene Quan, PA   0.25 mg at 03/08/11 1411  . amLODipine (NORVASC) tablet 5 mg  5 mg Oral Daily Doreene Burke [place], [place]   5 mg at 03/11/11 [id number]  . antiseptic oral rinse (BIOTENE) solution 15 mL  15 mL Mouth Rinse q12n4p Dewitt Hoes, RN   15 mL at 03/10/11 1647  . aspirin chewable tablet 324 mg  324 mg Oral Pre-Cath Isaiah Serge, NP   324 mg at 03/12/11 0544  . aspirin EC tablet 81 mg  81 mg Oral Daily Doreene Burke [place], [place]   81 mg at 03/11/11 [id number]  . bisacodyl (DULCOLAX) suppository 10 mg  10 mg Rectal Daily PRN Isaiah Serge, NP   10 mg at 03/11/11 1751  . cefTRIAXone (ROCEPHIN) 1 g in dextrose 5 % 50 mL IVPB   1 g Intravenous Q24H Leonie Man   1 g at 03/11/11 1747  . chlorhexidine (PERIDEX) 0.12 % solution 15 mL  15 mL Mouth Rinse BID Dewitt Hoes, RN   15 mL at 03/12/11 0856  . chlorpheniramine-HYDROcodone (TUSSIONEX) 10-8 MG/5ML suspension 5 mL  5 mL Oral Q12H PRN Erlene Quan, PA   5 mL at 03/10/11 1235  . clopidogrel (PLAVIX) tablet 75 mg  75 mg Oral Daily Doreene Burke [place], [place]   75 mg at 03/11/11 [id number]  . docusate sodium (COLACE) capsule 100 mg  100 mg Oral BID PRN Erlene Quan, PA   100 mg at 03/08/11 0949  . docusate sodium (COLACE) capsule 100 mg  100 mg Oral BID Isaiah Serge, NP   100 mg at 03/11/11 2207  . fluticasone (FLONASE) 50 MCG/ACT nasal spray 1 spray  1 spray Each Nare BID PRN Erlene Quan, PA      . furosemide (LASIX) injection 80 mg  80 mg Intravenous TID Isaiah Serge, NP   80 mg at 03/11/11 2210  . heparin ADULT infusion 100 units/ml (25000 units/250 ml)  650 Units/hr Intravenous Continuous Dareen Piano, [facility]  6.5 mL/hr at 03/11/11 2305 650 Units/hr at 03/11/11 2305  . insulin aspart (novoLOG) injection 0-5 Units  0-5 Units Subcutaneous QHS Doreene Burke [place], [place]   4 Units at 03/11/11 2210  . insulin aspart (novoLOG) injection 0-9 Units  0-9 Units Subcutaneous Q4H Isaiah Serge, NP   3 Units at 03/12/11 0857  . insulin detemir (LEVEMIR) injection 15 Units  15 Units Subcutaneous BID Isaiah Serge, NP   15 Units at 03/11/11 2209  . lactulose (CHRONULAC) 10 GM/15ML solution 20 g  20 g Oral Daily Isaiah Serge, NP   20 g at 03/09/11 0920  . levalbuterol (XOPENEX) nebulizer solution 0.63 mg  0.63 mg Nebulization Q4H PRN Isaiah Serge, NP      . metoprolol tartrate (LOPRESSOR) tablet 12.5 mg  12.5 mg Oral BID Doreene Burke [place], PA   12.5 mg at 03/11/11 2207  . morphine 2 MG/ML injection 2 mg  2 mg Intravenous Q2H PRN Rana Snare, NP   2 mg at 03/08/11 0432  . nitroGLYCERIN (NITROSTAT) SL tablet 0.4 mg  0.4 mg Sublingual Q5 min PRN Charles B. Karle Starch, MD   0.4 mg at  03/06/11 1019  . nitroGLYCERIN (NITROSTAT) SL tablet 0.4 mg  0.4 mg Sublingual Q5 min PRN Erlene Quan, PA      . nitroGLYCERIN 0.2 mg/mL in dextrose 5 % infusion  2-200 mcg/min Intravenous Continuous Leonie Man 6 mL/hr at 03/11/11 1900 20 mcg/min at 03/11/11 1900  . ondansetron (ZOFRAN) injection 4 mg  4 mg Intravenous Q6H PRN Erlene Quan, PA   4 mg at 03/10/11 1236  . pantoprazole (PROTONIX) EC tablet 40 mg  40 mg Oral [address] PHAR[facility]   40 mg at 03/11/11 1200  . polysaccharide iron (NIFEREX) capsule 150 mg  150 mg Oral Daily Isaiah Serge, NP   150 mg at 03/11/11 1000  . potassium chloride SA (K-DUR,KLOR-CON) CR tablet 20 mEq  20 mEq Oral Daily Isaiah Serge, NP   20 mEq at 03/11/11 [id number]  . potassium chloride SA (K-DUR,KLOR-CON) CR tablet 40 mEq  40 mEq Oral Once Isaiah Serge, NP      . psyllium (HYDROCIL/METAMUCIL) packet 1 packet  1 packet Oral Daily Isaiah Serge, NP   1 packet at 03/11/11 1700  . simvastatin (ZOCOR) tablet 20 mg  20 mg Oral Daily Doreene Burke [place], [place]   20 mg at 03/11/11 2207  . sodium chloride 0.9 % injection 3 mL  3 mL Intravenous PRN Erlene Quan, PA      . sodium chloride 0.9 % injection 3 mL  3 mL Intravenous Q12H Isaiah Serge, NP   3 mL at 03/11/11 2209  . sodium chloride 0.9 % injection 3 mL  3 mL Intravenous PRN Isaiah Serge, NP      . zolpidem (AMBIEN) tablet 5 mg  5 mg Oral QHS PRN Erlene Quan, PA      . DISCONTD: heparin ADULT infusion 100 units/ml (25000 units/250 ml)  750 Units/hr Intravenous Continuous Kenneth C. Hilty 7.5 mL/hr at 03/11/11 1600 750 Units/hr at 03/11/11 1600  . DISCONTD: insulin aspart (novoLOG) injection 0-15 Units  0-15 Units Subcutaneous TID WC Doreene Burke [place], PA   5 Units at 03/11/11 1944    PE: General appearance: alert, cooperative and no distress Lungs: Bilateral rales Heart: regular rate and rhythm and 1/6 systolic MM Extremities: No LEE Pulses: 2+ and symmetric  Lab  Results:   Basename 03/12/11 0522  03/11/11 0600 03/10/11 0500  WBC 9.9 8.4 8.1  HGB 10.6* 10.0* 9.5*  HCT 32.6* 31.7* 30.6*  PLT 233 209 195   BMET  Basename 03/12/11 0522 03/11/11 0600 03/10/11 0500  NA 141 138 142  K 3.4* 3.5 3.6  CL 92* 93* 97  CO2 38* 35* 36*  GLUCOSE 234* 214* 169*  BUN 41* 44* 42*  CREATININE 1.37* 1.60* 1.76*  CALCIUM 9.4 8.9 8.8   PT/INR  Basename 03/12/11 0145  LABPROT 14.6  INR 1.12    BNP 648.0  Cholesterol No results found for this basename: CHOL in the last 72 hours Cardiac Enzymes No components found with this basename: TROPONIN:3, CKMB:3  Studies/Results:    Assessment/Plan  Principal Problem:  *Respiratory failure with hypoxia - secondary to acute diastolic heart failure due to non-STEMI Active Problems:  Diastolic CHF, acute  Diabetes mellitus  HTN (hypertension), benign  Dyslipidemia  PVD (peripheral vascular disease)  CVA (cerebral infarction)  Obesity (BMI 30-39.9)  Chronic renal insufficiency, stage III (moderate)  Non-ST elevated myocardial infarction (non-STEMI) - inferior lateral ST depression, peak troponin 16  Fever - documented  101  Community acquired pneumonia  UTI (lower urinary tract infection) Hypokalemia   Plan:  Having a left heart cath today.  Will give potassium.  Cr improved.  BP/HR stable and controlled.  Hgb improved.  BNP elevated at 648.0 - much improved from admission level.  Has been hydrated at 50cc/hr for cath.   LOS: 6 days    HAGER,BRYAN W 03/12/2011 10:24 AM  I have seen and examined the patient along with Tarri Fuller, PA.  I have reviewed the chart, notes and new data.  I agree with Bryan's note.  Key new complaints: None  Key examination changes: Much improved, agree with Bryan's exam - lungs still with basal rales, but much improved.; Remains Afebrile;  MALLAMPATI AIRWAY - 4. Key new findings / data: K 3.4; Cr. 1.37, Hgb 10.6  PLAN: She is now stable -- plan is Heart Catheterization today - +/- PCI. Already on  Plavix Continue current med Rx as BP & HR stable Will need to have stable Lasix dose on d/c.  Performing MD: Leonie Man, M.D., M.S.  Procedures:   LEFT HEART CATHETERIZATION WITHOUT LEFT VENTRICULOGRAPHY  NATIVE CORONARY ANGIOGRAPHY  POSSIBLE PERCUTANEOUS CORONARY INTERVENTION (BALLOON / STENT)  The procedure with Risks/Benefits/Alternatives and Indications was reviewed with the patient and family (daughter).  All questions were answered.    Risks / Complications include, but not limited to: Death, MI, CVA/TIA, VF/VT (with defibrillation), Bradycardia (need for temporary pacer placement), contrast induced nephropathy (with recent renal insufficiency, greater than usual risk), bleeding / bruising / hematoma / pseudoaneurysm, vascular or coronary injury (with possible emergent CT or Vascular Surgery), adverse medication reactions, infection.    The patient and family -- Daughter, who has signed the consent form for the patient -- voice understanding and agree to proceed.   I have signed the consent form and placed it on the chart for patient signature and RN witness.     Leonie Man, M.D., M.S. [facility] [address] [place], [place]  [address]  [phone]  03/12/2011 10:54 AM

## 2011-03-12 NOTE — Brief Op Note (Signed)
03/06/2011 - 03/12/2011  5:17 PM  PATIENT:  Danielle [name]  76 y.o. female with obesity, DM, HTN & h/o CVA - Carotid stent -- who presented on 11/27 with worsening dyspnea, edema.  She was found to be in acute "diastolic HF" with troponin increase to ~16.  She has been stabilized medically & diuresed.  She initially had renal insufficiency that has improved.  She has also had a fever & UA c/w UTI with ? PNA on CXR -- treated with Abx.  She is now referred for invasive cardiac evaluation with cardiac catheterization +/- PCI.  PRE-OPERATIVE DIAGNOSIS:  NSTEMI CHF - acute diastolic  POST-OPERATIVE DIAGNOSIS:  Severe Multivessel CAD 1. LAD - proximal midportion 75% lesion focal, by IVUS; mid tubular ~70% by IVUS, distal diffuse disease. 2.  LCx - moslty OM1: prox ~90%, mid - 80%  3.  RCA - diffuse disease into RPL & RDA system.  PROCEDURE:   LEFT HEART CATHETERIZATION WITH CORONARY ANGIOGRAM (no Left Ventriculogram due to recent renal insufficiency)  INTRAVASCULAR ULTRASOUND on mid LAD  SURGEON:  Surgeon(s): Leonie Man   ANESTHESIA:   local and IV sedation -- 1 mg IV Versed  EBL:  Total I/O In: 113 [I.V.:113] Out: 150 [Urine:150]  LOCAL MEDICATIONS USED:  LIDOCAINE 15 ml  SPECIMEN:  No Specimen  DISPOSITION OF SPECIMEN:  N/A  ANGIOSEAL - 6FR - to RCFA - after R CFA angiography  DICTATION: .Note written in paper chart and Note written in EPIC  PLAN OF CARE: Transition back to IV Heparin.,. CVTS consult  PATIENT DISPOSITION:  Stable - to PCAU then to Stepdown 2500.   Restart Heparin 6 hr post cath

## 2011-03-12 NOTE — Progress Notes (Signed)
ANTICOAGULATION CONSULT NOTE - Follow Up Consult  Pharmacy Consult for Heparin Indication: Chest pain/ACS  No Known Allergies  Patient Measurements: Height: 5\' 1"  (154.9 cm) Weight: 182 lb 15.7 oz (83 kg) IBW/kg (Calculated) : 47.8    Labs:  Basename 03/12/11 0901 03/12/11 0522 03/12/11 0145 03/11/11 1640 03/11/11 0600 03/10/11 0500  HGB -- 10.6* -- -- 10.0* --  HCT -- 32.6* -- -- 31.7* 30.6*  PLT -- 233 -- -- 209 195  APTT -- -- -- -- -- --  LABPROT -- -- 14.6 -- -- --  INR -- -- 1.12 -- -- --  HEPARINUNFRC 0.36 -- -- 0.79* 1.23* --  CREATININE -- 1.37* -- -- 1.60* 1.76*  CKTOTAL -- -- -- -- -- --  CKMB -- -- -- -- -- --  TROPONINI -- -- -- -- -- --   Estimated Creatinine Clearance: 35.2 ml/min (by C-G formula based on Cr of 1.37).   Assessment: To resume heparin 6 hrs after sheath pulled s/p cath. Sheath pulled at 1700. HL was tx earlier today on 650 units/hr  Goal of Therapy:  Heparin level 0.3-0.7 units/ml   Plan:  -resume heparin at 650 units/hr 6 hrs s/p sheath pull at 2300 tonight -8 hr heparin level and cbc Danielle [name] 03/12/2011 Pager: [phone]

## 2011-03-12 NOTE — Progress Notes (Signed)
ANTICOAGULATION CONSULT NOTE - Follow Up Consult  Pharmacy Consult for Heparin Indication: Chest pain/ACS  No Known Allergies  Patient Measurements: Height: 5\' 1"  (154.9 cm) Weight: 182 lb 15.7 oz (83 kg) IBW/kg (Calculated) : 47.8  Adjusted Body Weight:   Vital Signs: Temp: 97.7 F (36.5 C) (12/03 0818) Temp src: Oral (12/03 0818) BP: 131/55 mmHg (12/03 1026) Pulse Rate: 71  (12/03 1026)  Labs:  Basename 03/12/11 0901 03/12/11 0522 03/12/11 0145 03/11/11 1640 03/11/11 0600 03/10/11 0500  HGB -- 10.6* -- -- 10.0* --  HCT -- 32.6* -- -- 31.7* 30.6*  PLT -- 233 -- -- 209 195  APTT -- -- -- -- -- --  LABPROT -- -- 14.6 -- -- --  INR -- -- 1.12 -- -- --  HEPARINUNFRC 0.36 -- -- 0.79* 1.23* --  CREATININE -- 1.37* -- -- 1.60* 1.76*  CKTOTAL -- -- -- -- -- --  CKMB -- -- -- -- -- --  TROPONINI -- -- -- -- -- --   Estimated Creatinine Clearance: 35.2 ml/min (by C-G formula based on Cr of 1.37).   Medications:  Scheduled:     . amLODipine  5 mg Oral Daily  . antiseptic oral rinse  15 mL Mouth Rinse q12n4p  . aspirin  324 mg Oral Pre-Cath  . aspirin EC  81 mg Oral Daily  . cefTRIAXone (ROCEPHIN)  IV  1 g Intravenous Q24H  . chlorhexidine  15 mL Mouth Rinse BID  . clopidogrel  75 mg Oral Daily  . docusate sodium  100 mg Oral BID  . furosemide  80 mg Intravenous TID  . insulin aspart  0-5 Units Subcutaneous QHS  . insulin aspart  0-9 Units Subcutaneous Q4H  . insulin detemir  15 Units Subcutaneous BID  . lactulose  20 g Oral Daily  . metoprolol tartrate  12.5 mg Oral BID  . pantoprazole  40 mg Oral Q1200  . polysaccharide iron  150 mg Oral Daily  . potassium chloride  20 mEq Oral Daily  . potassium chloride  40 mEq Oral Once  . psyllium  1 packet Oral Daily  . simvastatin  20 mg Oral Daily  . sodium chloride  3 mL Intravenous Q12H  . DISCONTD: insulin aspart  0-15 Units Subcutaneous TID WC   Infusions:     . sodium chloride 50 mL/hr at 03/12/11 0432  .  heparin 650 Units/hr (03/11/11 2305)  . nitroGLYCERIN 20 mcg/min (03/11/11 1900)  . DISCONTD: heparin 750 Units/hr (03/11/11 1600)    Assessment: 76 yo who was admitted to r/o MI. She has been on heparin since admission. Heparin level =0.36 this morning which is therapeutic.  Goal of Therapy:  Heparin level 0.3-0.7 units/ml   Plan:  - Continue heparin at 650 units/hr. - will follow up after cardiac cath planned for today.  Hildred Laser, Pharm D 03/12/2011 10:58 AM

## 2011-03-13 LAB — CBC
MCH: 29.3 pg (ref 26.0–34.0)
MCV: 92.3 fL (ref 78.0–100.0)
Platelets: 221 10*3/uL (ref 150–400)
RBC: 3.75 MIL/uL — ABNORMAL LOW (ref 3.87–5.11)
RDW: 12.5 % (ref 11.5–15.5)

## 2011-03-13 LAB — GLUCOSE, CAPILLARY
Glucose-Capillary: 237 mg/dL — ABNORMAL HIGH (ref 70–99)
Glucose-Capillary: 223 mg/dL — ABNORMAL HIGH (ref 70–99)
Glucose-Capillary: 399 mg/dL — ABNORMAL HIGH (ref 70–99)

## 2011-03-13 LAB — BASIC METABOLIC PANEL
Calcium: 9.5 mg/dL (ref 8.4–10.5)
Chloride: 93 mEq/L — ABNORMAL LOW (ref 96–112)
Creatinine, Ser: 1.2 mg/dL — ABNORMAL HIGH (ref 0.50–1.10)
GFR calc Af Amer: 50 mL/min — ABNORMAL LOW (ref >90)
GFR calc non Af Amer: 43 mL/min — ABNORMAL LOW (ref >90)

## 2011-03-13 MED ORDER — FUROSEMIDE 40 MG PO TABS
40.0000 mg | ORAL_TABLET | Freq: Two times a day (BID) | ORAL | Status: DC
  Administered 2011-03-13 – 2011-03-17 (×6): 40 mg via ORAL
  Filled 2011-03-13 (×11): qty 1

## 2011-03-13 MED ORDER — INSULIN DETEMIR 100 UNIT/ML ~~LOC~~ SOLN
30.0000 [IU] | Freq: Two times a day (BID) | SUBCUTANEOUS | Status: DC
Start: 2011-03-13 — End: 2011-03-14
  Administered 2011-03-13: 30 [IU] via SUBCUTANEOUS

## 2011-03-13 MED ORDER — INSULIN ASPART 100 UNIT/ML ~~LOC~~ SOLN
0.0000 [IU] | Freq: Every day | SUBCUTANEOUS | Status: DC
  Administered 2011-03-13: 3 [IU] via SUBCUTANEOUS
  Administered 2011-03-14: 4 [IU] via SUBCUTANEOUS
  Administered 2011-03-15: 3 [IU] via SUBCUTANEOUS
  Administered 2011-03-16: 4 [IU] via SUBCUTANEOUS

## 2011-03-13 MED ORDER — INSULIN ASPART 100 UNIT/ML ~~LOC~~ SOLN
0.0000 [IU] | Freq: Three times a day (TID) | SUBCUTANEOUS | Status: DC
  Administered 2011-03-13 – 2011-03-14 (×2): 20 [IU] via SUBCUTANEOUS
  Administered 2011-03-14 – 2011-03-15 (×3): 11 [IU] via SUBCUTANEOUS
  Administered 2011-03-15 (×2): 15 [IU] via SUBCUTANEOUS
  Administered 2011-03-16 – 2011-03-17 (×2): 7 [IU] via SUBCUTANEOUS

## 2011-03-13 NOTE — Progress Notes (Signed)
Patient ID: Danielle [name], female   DOB: 1935/01/01, 76 y.o.   MRN: [id number]   [facility]  Subjective: Feels ok.  No CP  Objective: Vital signs in last 24 hours: Temp:  [97.6 F (36.4 C)-98.3 F (36.8 C)] 97.8 F (36.6 C) (12/04 0432) Pulse Rate:  [62-85] 85  (12/04 0432) Resp:  [12-25] 14  (12/04 0432) BP: (113-141)/(49-65) 137/65 mmHg (12/04 0432) SpO2:  [95 %-100 %] 100 % (12/04 0432) Weight:  [79 kg (174 lb 2.6 oz)] 174 lb 2.6 oz (79 kg) (12/04 0432) Last BM Date: 03/11/11  Intake/Output from previous day: 12/03 0701 - 12/04 0700 In: 967.4 [I.V.:967.4] Out: 1575 [Urine:1575] Intake/Output this shift:    Medications Current Facility-Administered Medications  Medication Dose Route Frequency Provider Last Rate Last Dose  . 0.9 %  sodium chloride infusion   Intravenous Continuous Leonie Man 75 mL/hr at 03/12/11 1900    . acetaminophen (TYLENOL) tablet 650 mg  650 mg Oral Q4H PRN Erlene Quan, PA      . ALPRAZolam Duanne Moron) tablet 0.25 mg  0.25 mg Oral BID PRN Erlene Quan, PA   0.25 mg at 03/08/11 1411  . amLODipine (NORVASC) tablet 5 mg  5 mg Oral Daily Doreene Burke [place], [place]   5 mg at 03/12/11 1027  . ceFAZolin (ANCEF) 1-5 GM-% IVPB           . furosemide (LASIX) injection 80 mg  80 mg Intravenous TID Isaiah Serge, NP   80 mg at 03/13/11 0914  . heparin 2-0.9 UNIT/ML-% infusion           . heparin ADULT infusion 100 units/ml (25000 units/250 ml)  650 Units/hr Intravenous Continuous Leonie Man 6.5 mL/hr at 03/12/11 2312 650 Units/hr at 03/12/11 2312  . insulin aspart (novoLOG) injection 0-5 Units  0-5 Units Subcutaneous QHS Doreene Burke [place], [place]   3 Units at 03/12/11 2318  . insulin aspart (novoLOG) injection 0-9 Units  0-9 Units Subcutaneous Q4H Isaiah Serge, NP   3 Units at 03/13/11 0911  . insulin detemir (LEVEMIR) injection 15 Units  15 Units Subcutaneous BID Isaiah Serge, NP   15 Units at 03/13/11 0912  . lactulose (CHRONULAC) 10  GM/15ML solution 20 g  20 g Oral Daily Isaiah Serge, NP   20 g at 03/12/11 1028  . levalbuterol (XOPENEX) nebulizer solution 0.63 mg  0.63 mg Nebulization Q4H PRN Isaiah Serge, NP      . lidocaine (XYLOCAINE) 1 % injection           . metoprolol tartrate (LOPRESSOR) tablet 12.5 mg  12.5 mg Oral BID Doreene Burke [place], PA   12.5 mg at 03/12/11 2147  . midazolam (VERSED) 2 MG/2ML injection           . morphine 2 MG/ML injection 2 mg  2 mg Intravenous Q2H PRN Rana Snare, NP   2 mg at 03/08/11 0432  . nitroGLYCERIN (NTG ON-CALL) 0.2 mg/mL injection           . nitroGLYCERIN (NTG ON-CALL) 0.2 mg/mL injection           . nitroGLYCERIN 0.2 mg/mL in dextrose 5 % infusion  6 mcg/min Intravenous Continuous Leonie Man 1.8 mL/hr at 03/12/11 2148 6 mcg/min at 03/12/11 2148  . pantoprazole (PROTONIX) EC tablet 40 mg  40 mg Oral [address], [place]   40 mg at 03/12/11 1229  .  polysaccharide iron (NIFEREX) capsule 150 mg  150 mg Oral Daily Isaiah Serge, NP   150 mg at 03/12/11 1028  . potassium chloride SA (K-DUR,KLOR-CON) CR tablet 20 mEq  20 mEq Oral Daily Isaiah Serge, NP   20 mEq at 03/12/11 1028  . potassium chloride SA (K-DUR,KLOR-CON) CR tablet 40 mEq  40 mEq Oral Once Isaiah Serge, NP   40 mEq at 03/12/11 1027  . simvastatin (ZOCOR) tablet 20 mg  20 mg Oral Daily Doreene Burke [place], [place]   20 mg at 03/12/11 2148  . zolpidem (AMBIEN) tablet 5 mg  5 mg Oral QHS PRN Erlene Quan, PA      . DISCONTD: 0.9 %  sodium chloride infusion  250 mL Intravenous PRN Isaiah Serge, NP      . DISCONTD: 0.9 %  sodium chloride infusion   Intravenous Continuous Isaiah Serge, NP 50 mL/hr at 03/12/11 0432    . DISCONTD: acetaminophen (TYLENOL) tablet 650 mg  650 mg Oral Q6H PRN Leonie Man   650 mg at 03/08/11 0053  . DISCONTD: antiseptic oral rinse (BIOTENE) solution 15 mL  15 mL Mouth Rinse q12n4p Dewitt Hoes, RN   15 mL at 03/10/11 1647  . DISCONTD: aspirin EC tablet 81 mg  81 mg Oral Daily  Doreene Burke [place], [place]   81 mg at 03/12/11 1026  . DISCONTD: bisacodyl (DULCOLAX) suppository 10 mg  10 mg Rectal Daily PRN Isaiah Serge, NP   10 mg at 03/11/11 1751  . DISCONTD: cefTRIAXone (ROCEPHIN) 1 g in dextrose 5 % 50 mL IVPB  1 g Intravenous Q24H Leonie Man   1 g at 03/11/11 1747  . DISCONTD: chlorhexidine (PERIDEX) 0.12 % solution 15 mL  15 mL Mouth Rinse BID Dewitt Hoes, RN   15 mL at 03/12/11 0856  . DISCONTD: chlorpheniramine-HYDROcodone (TUSSIONEX) 10-8 MG/5ML suspension 5 mL  5 mL Oral Q12H PRN Erlene Quan, PA   5 mL at 03/10/11 1235  . DISCONTD: clopidogrel (PLAVIX) tablet 75 mg  75 mg Oral Daily Doreene Burke [place], [place]   75 mg at 03/12/11 1027  . DISCONTD: docusate sodium (COLACE) capsule 100 mg  100 mg Oral BID PRN Erlene Quan, PA   100 mg at 03/08/11 0949  . DISCONTD: docusate sodium (COLACE) capsule 100 mg  100 mg Oral BID Isaiah Serge, NP   100 mg at 03/12/11 1026  . DISCONTD: fluticasone (FLONASE) 50 MCG/ACT nasal spray 1 spray  1 spray Each Nare BID PRN Erlene Quan, PA      . DISCONTD: heparin ADULT infusion 100 units/ml (25000 units/250 ml)  650 Units/hr Intravenous Continuous Dareen Piano, PHARMD 6.5 mL/hr at 03/11/11 2305 650 Units/hr at 03/11/11 2305  . DISCONTD: insulin aspart (novoLOG) injection 0-15 Units  0-15 Units Subcutaneous TID WC Doreene Burke [place], PA   5 Units at 03/11/11 1944  . DISCONTD: nitroGLYCERIN (NITROSTAT) SL tablet 0.4 mg  0.4 mg Sublingual Q5 min PRN Charles B. Karle Starch, MD   0.4 mg at 03/06/11 1019  . DISCONTD: nitroGLYCERIN (NITROSTAT) SL tablet 0.4 mg  0.4 mg Sublingual Q5 min PRN Erlene Quan, PA      . DISCONTD: nitroGLYCERIN 0.2 mg/mL in dextrose 5 % infusion  2-200 mcg/min Intravenous Continuous Leonie Man 6 mL/hr at 03/11/11 1900 20 mcg/min at 03/11/11 1900  . DISCONTD: ondansetron (ZOFRAN) injection 4 mg  4 mg Intravenous Q6H PRN Lurena Joiner  Gerda Diss, PA   4 mg at 03/10/11 1236  . DISCONTD: psyllium (HYDROCIL/METAMUCIL) packet 1 packet   1 packet Oral Daily Isaiah Serge, NP   1 packet at 03/12/11 1027  . DISCONTD: sodium chloride 0.9 % injection 3 mL  3 mL Intravenous PRN Erlene Quan, PA      . DISCONTD: sodium chloride 0.9 % injection 3 mL  3 mL Intravenous Q12H Isaiah Serge, NP   3 mL at 03/12/11 1046  . DISCONTD: sodium chloride 0.9 % injection 3 mL  3 mL Intravenous PRN Isaiah Serge, NP        PE: General appearance: alert, cooperative and no distress Lungs: clear to auscultation bilaterally Heart: regular rate and rhythm 1/6 systolic MM. Extremities: No LEE Pulses: 2+ and symmetric  Lab Results:   Basename 03/13/11 0615 03/12/11 0522 03/11/11 0600  WBC 10.3 9.9 8.4  HGB 11.0* 10.6* 10.0*  HCT 34.6* 32.6* 31.7*  PLT 221 233 209   BMET  Basename 03/12/11 0522 03/11/11 0600  NA 141 138  K 3.4* 3.5  CL 92* 93*  CO2 38* 35*  GLUCOSE 234* 214*  BUN 41* 44*  CREATININE 1.37* 1.60*  CALCIUM 9.4 8.9   PT/INR  Basename 03/12/11 0145  LABPROT 14.6  INR 1.12    Studies/Results: Left Heart Cath, 03/12/11  Hemodynamics:  Central Aortic Pressure / Mean Aortic Pressure: 141/57 mmHg; 90 mmHg  LV Pressure / LV End diastolic Pressure: 123456 mmHg, 40 mmHg  Left Ventriculography: Not performed to conserve contrast as the patient has had recent acute renal insufficiency.  Coronary Angiographic Data: Severe diffuse disease  Right dominant  Left Main: Short,large caliber angiographically normal  Left Anterior Descending (LAD): Moderate to large caliber, proximally calcified; gives rise to 2 small diagonal branches and several septal perforators.  First lesion: 75% by IVUS is a focal calcified lesion at the take off of a small D1  Second lesion: 70% by IVUS, midway between D1 and D2 at a septal perforator  Third lesion: 70% IVUS, just after small branching D2.  Remainder the LAD courses down around the apex and has diffuse 40-60% lesions distally  Circumflex (LCx): Large-caliber vessel that gives off  several small marginal branches but basically terminates in a large lateral marginal branch that bifurcates reaching the apex. Small AV groove artery. There is a proximal irregular 60-70% lesion followed by a severe 95% stenosis just prior to the AV groove artery.  Large Lateral Obtuse Marginal: This is the following one of the main trunk of the circumflex, proximal tubular 70-80% lesion before this vessel courses no other significant disease to the apex.  Ramus Intermedius: N./A.  Right Coronary Artery: This is an extremely diffusely diseased vessel. The proximal vessel starts off as a large caliber vessel that has an abrupt tubular 50-60% lesion followed by a short relatively normal segment it tapers down from 40-70% following a short post stenotic dilatation segment. This is her of the take off of an RV marginal branch. After this the vessel courses to the bifurcation with stent was worsening lesions of 60 To 80-90% tubular diffuse disease.  Right Posterior Descending Artery: Appears to be subtotally occluded proximally with to-and-fro flow flow from left to right collaterals via LAD septal perforators.  Right Posterior Lateral System: 95% proximal right atrial ventricular groove stenosis. Following this vessel bifurcates into a 2 posterior lateral branches. Prior to the stenosis there is a small posterior lateral versus double barrel RPDA. Impression:  1. Severe multivessel coronary disease: Sequential 75% followed by 2 sequential 70% lesions in the mid LAD confirmed by IVUS (see pictures in the chart), proximal mid circumflex 95% short tubular followed by tubular 70% in the long OM branch, diffusely diseased RCA with at least 50-60% lesions throughout the proximal and mid segment worsening to 70 to 80% prior to the bifurcation into the posterior descending artery that appears to be occluded with to-and-fro flow from LAD septal collaterals and a severely stenotic right posterior atrioventricular groove  vessel.  2. Best option for this patient with diabetes and multivessel disease is coronary artery bypass grafting.  3. Culprit lesion is most likely the circumflex 95% stenosis versus the right PDA.  4. Preserved LV EF by echocardiogram with diastolic heart failure.  5. Subtherapeutic P2Y12 platelet inhibition level.  6. Diabetes, prior CVA with carotid stenting the    Assessment/Plan  Principal Problem:  *Respiratory failure with hypoxia - secondary to acute diastolic heart failure due to non-STEMI Active Problems:  Diastolic CHF, acute  Diabetes mellitus  HTN (hypertension), benign  Dyslipidemia  PVD (peripheral vascular disease)  CVA (cerebral infarction)  Obesity (BMI 30-39.9)  Chronic renal insufficiency, stage III (moderate)  Non-ST elevated myocardial infarction (non-STEMI) - inferior lateral ST depression, peak troponin 16  Fever - documented  101  Community acquired pneumonia  UTI (lower urinary tract infection)  Plan:  Severe multivessel CAD.  CTS consult. BP and HR stable and controlled. Afebrile.  Ordering PT evaluation and treat.  Incentive spirometer.  Changed Lasix 80mg  IV Q8 to 40mg  twice daily PO.  BMET this morning.  AM labs.   LOS: 7 days    HAGER,BRYAN W 03/13/2011 9:18 AM   Agree with note written by Luisa Dago PAC  Pt seen. S/P cath with 3VD and nl LV fxn by 2D. She has surgical anatomy. TCTS consulted. Meds adjusted.  Lorretta Harp 03/13/2011 10:17 AM

## 2011-03-13 NOTE — Progress Notes (Signed)
Inpatient Diabetes Program Recommendations  AACE/ADA: New Consensus Statement on Inpatient Glycemic Control (2009)  Target Ranges:  Prepandial:   less than 140 mg/dL      Peak postprandial:   less than 180 mg/dL (1-2 hours)      Critically ill patients:  140 - 180 mg/dL   Reason for Visit: Patient eating: D/C q4hr cbgs and change to TID and HS  Inpatient Diabetes Program Recommendations Insulin - Basal: Increase Lantus towards home dose: Consider Lantus 30 units BID Correction (SSI): Change to Moderate Novolog TID and HS

## 2011-03-13 NOTE — Progress Notes (Signed)
ANTICOAGULATION CONSULT NOTE - Follow Up Consult  Pharmacy Consult for heparin Indication: chest pain/ACS and awaiting CABG  No Known Allergies  Patient Measurements: Height: 5\' 1"  (154.9 cm) Weight: 174 lb 2.6 oz (79 kg) IBW/kg (Calculated) : 47.8   Vital Signs: Temp: 97.8 F (36.6 C) (12/04 0432) Temp src: Oral (12/04 0432) BP: 137/65 mmHg (12/04 0432) Pulse Rate: 85  (12/04 0432)  Labs:  Basename 03/13/11 0615 03/12/11 0901 03/12/11 0522 03/12/11 0145 03/11/11 1640 03/11/11 0600  HGB 11.0* -- 10.6* -- -- --  HCT 34.6* -- 32.6* -- -- 31.7*  PLT 221 -- 233 -- -- 209  APTT -- -- -- -- -- --  LABPROT -- -- -- 14.6 -- --  INR -- -- -- 1.12 -- --  HEPARINUNFRC 0.34 0.36 -- -- 0.79* --  CREATININE -- -- 1.37* -- -- 1.60*  CKTOTAL -- -- -- -- -- --  CKMB -- -- -- -- -- --  TROPONINI -- -- -- -- -- --   Estimated Creatinine Clearance: 34.3 ml/min (by C-G formula based on Cr of 1.37).   Medications:  Scheduled:    . amLODipine  5 mg Oral Daily  . ceFAZolin      . furosemide  80 mg Intravenous TID  . heparin      . insulin aspart  0-5 Units Subcutaneous QHS  . insulin aspart  0-9 Units Subcutaneous Q4H  . insulin detemir  15 Units Subcutaneous BID  . lactulose  20 g Oral Daily  . lidocaine      . metoprolol tartrate  12.5 mg Oral BID  . midazolam      . nitroGLYCERIN      . nitroGLYCERIN      . pantoprazole  40 mg Oral Q1200  . polysaccharide iron  150 mg Oral Daily  . potassium chloride  20 mEq Oral Daily  . potassium chloride  40 mEq Oral Once  . simvastatin  20 mg Oral Daily  . DISCONTD: antiseptic oral rinse  15 mL Mouth Rinse q12n4p  . DISCONTD: aspirin EC  81 mg Oral Daily  . DISCONTD: cefTRIAXone (ROCEPHIN)  IV  1 g Intravenous Q24H  . DISCONTD: chlorhexidine  15 mL Mouth Rinse BID  . DISCONTD: clopidogrel  75 mg Oral Daily  . DISCONTD: docusate sodium  100 mg Oral BID  . DISCONTD: insulin aspart  0-15 Units Subcutaneous TID WC  . DISCONTD: psyllium  1  packet Oral Daily  . DISCONTD: sodium chloride  3 mL Intravenous Q12H   Infusions:    . sodium chloride 75 mL/hr at 03/12/11 1900  . heparin 650 Units/hr (03/12/11 2312)  . nitroGLYCERIN 6 mcg/min (03/12/11 2148)  . DISCONTD: sodium chloride 50 mL/hr at 03/12/11 0432  . DISCONTD: heparin 650 Units/hr (03/11/11 2305)  . DISCONTD: nitroGLYCERIN 20 mcg/min (03/11/11 1900)    Assessment: 83 yof s/p cardiac cath 12/3. Heparin level is therapeutic at 0.34. Pt requires CABG. No bleeding noted, CBC is stable.   Goal of Therapy:  Heparin level 0.3-0.7 units/ml   Plan:  Continue heparin gtt at 650units/hr F/u AM heparin level F/u CABG plans  Danielle [name], Danielle [name] 03/13/2011,8:26 AM

## 2011-03-13 NOTE — Op Note (Signed)
[place]     CARDIAC CATHETERIZATION REPORT  Danielle [name]   MRN: [id number] Sep 21, 1934   ADMIT DATE:  03/06/2011  Performing Cardiologist: Leonie Man, M.D., MS Primary Physician: No primary provider on file. Primary Cardiologist:  Leonie Man, M.D., MS  Procedure date: 12/3/20012  Procedures Performed:  Left Heart Catheterization via 5 Fr Right Common Femoral Artery access  Native Coronary Angiography  Intracoronary Vascular Ultrasound Measurement of sequential lesions in the mid LAD   Intracoronary Nitroglycerin injection-200 mcg  Indication(s): Non-ST elevation MI  Acute on chronic diastolic heart failure secondary to non-ST elevation MI  Type 2 Diabetes on insulin  History of carotid artery disease status post stenting following stroke, with residual left-sided lotion the weakness   History: [name] (significant language barrier requiring translator) with obesity, DM, HTN & h/o CVA - Carotid stent -- who presented on 11/27 with worsening dyspnea, edema. She was found to be in acute "diastolic HF" with troponin increase to ~16. She has been stabilized medically & diuresed. She initially had renal insufficiency that has improved. She has also had a fever & UA c/w UTI with ? PNA on CXR -- treated with Abx. She is now referred for invasive cardiac evaluation with cardiac catheterization +/- PCI.  Consent: The procedure with Risks/Benefits/Alternatives and Indications was reviewed with the patient's daughter serving as interpreter.  All questions were answered.    Risks / Complications include, but not limited to: Death, MI, CVA/TIA, VF/VT (with defibrillation), Bradycardia (need for temporary pacer placement), contrast induced nephropathy -- with increased risk due to recent renal insufficiency, bleeding / bruising / hematoma / pseudoaneurysm, vascular or coronary injury (with possible emergent CT or Vascular Surgery), adverse  medication reactions, infection.    The patient's daughter voices, and has explained to her mother.  They voiced understanding and  Have agreed to proceed.   Consent for signed by MD and patient with RN witness -- placed on chart.  Procedure: The patient was brought to the [facility] in the fasting state and prepped and draped in the usual sterile fashion for Right groin access.  Sterile technique was used including antiseptics, cap, gloves, gown, hand hygiene, mask and sheet.  Skin prep: Chlorhexidine;  Time Out: Verified patient identification, verified procedure, site/side was marked, verified correct patient position, special equipment/implants available, medications/allergies/relevent history reviewed, required imaging and test results available.  Performed  The right femoral head was identified using tactile and fluoroscopic technique.  The right groin was anesthetized with 1% subcutaneous Lidocaine.  The right Common Femoral Artery was accessed using the Modified Seldinger Technique with placement of a antimicrobial bonded/coated single lumen (5 Fr) sheath. The sheath was aspirated and flushed.    A 5 Fr  JL4 Catheter was advanced of over a Standard J wire into the ascending Aorta.  The catheter was used to engage the  left coronary artery.  Multiple cineangiographic views of the  Left coronary artery system were performed.  A  5 Fr  JR 4 Catheter was advanced of over a  standard J) wire into the ascending Aorta.  The catheter was used to engage the  right coronary artery.  Multiple cineangiographic views of the  right coronary artery system were performed. This catheter was then exchanged over the  standard J wire for an angled Pigtail catheter that was advanced across the Aortic Valve.  LV hemodynamics were measured, but Left Ventriculography was not  performed in order to conserve contrast.  The catheter was pulled back across the Aortic Valve for  measurement of "pull-back" gradient.  The catheter was removed completely out of the body, over a wire.  Initial evaluation of the left coronary angiography was concerning for several potential lesion. In light of this diffuse disease in the right coronary artery and circumflex artery, it was felt vital to show significant lesions in the LAD. The decision was made to proceed with IVUS measurement of the mid LAD the lesions.  The 5 French sheath was exchanged for a 6 [place] sheath which was aspirated and flushed. 4000 units of intravenous heparin was administered, confirming ACT greater than 200 sec.  Intravascular ultrasound procedure: A 6 French EBU 3.5 guide catheter was advanced over a wire into the ascending aorta and used to engage the left coronary artery. A Prowater wire was advanced to the distal LAD and confirmed angiographically. 200 mcg Intracoronary nitroglycerin was administered. The IVUS catheter was then advanced to the distal LAD, and a pullback performed. Off-line image evaluation was was also performed, however initial evaluation revealed at least 3 significant lesions. The IVUS catheter was then removed. Angiography was performed with and without wire to confirm no evidence of dissection, perforation, coronary spasm. The wire was removed, and the guide catheter was removed over a wire.   A Right Common Femoral Angiogram was performed to evaluate adequacy of anatomy for Angio-Seal closure, which did confirm appropriate anatomy. The right groin was then reprepped and draped in sterile fashion, new sterile gloves were donned. The right common femoral artery was then closed using a 6 French Angio-Seal closure device and hemostasis.  The patient was transported to the PACU and then 2500 in stable condition.    The patient  was stable before, during and following the procedure.   Patient did tolerate procedure well. There were not complications.  EBL: Less than 10 mL    Medications:  Sedation:  1 mg IV Versed,   Contrast:  125 Omnipaque  Hemodynamics:  Central Aortic Pressure / Mean Aortic Pressure: 141/57 mmHg; 90 mmHg  LV Pressure / LV End diastolic Pressure:  123456 mmHg, 40 mmHg  Left Ventriculography:  Not performed to conserve contrast as the patient has had recent acute renal insufficiency.  Coronary Angiographic Data: Severe diffuse disease Right dominant  Left Main:  Short,large caliber angiographically normal  Left Anterior Descending (LAD):  Moderate to large caliber, proximally calcified; gives rise to 2 small diagonal branches and several septal perforators.  First lesion: 75% by IVUS is a focal calcified lesion at the take off of a small D1  Second lesion: 70% by IVUS, midway between D1 and D2 at a septal perforator  Third lesion: 70% IVUS, just after small branching D2.  Remainder the LAD courses down around the apex and has diffuse 40-60% lesions distally  Circumflex (LCx):  Large-caliber vessel that gives off several small marginal branches but basically terminates in a large lateral marginal branch that bifurcates reaching the apex. Small AV groove artery. There is a proximal irregular 60-70% lesion followed by a severe 95% stenosis just prior to the AV groove artery.  Large Lateral Obtuse Marginal:  This is the following one of the main trunk of the circumflex, proximal tubular 70-80% lesion before this vessel courses no other significant disease to the apex.  Ramus Intermedius:  N./A.  Right Coronary Artery: This is an extremely diffusely diseased vessel. The proximal vessel starts off as a large caliber  vessel that has an abrupt tubular 50-60% lesion followed by a short relatively normal segment it tapers down from 40-70% following a short post stenotic dilatation segment. This is her of the take off of an RV marginal branch. After this the vessel courses to the bifurcation with stent was worsening lesions of 60 To 80-90%  tubular diffuse disease.  Right Posterior Descending Artery: Appears to be subtotally occluded proximally with to-and-fro flow flow from left to right collaterals via LAD septal perforators.  Right Posterior Lateral System:  95% proximal right atrial ventricular groove stenosis. Following this vessel bifurcates into a 2 posterior lateral branches. Prior to the stenosis there is a small posterior lateral versus double barrel RPDA.  Impression: 1.  Severe multivessel coronary disease: Sequential 75% followed by 2 sequential 70% lesions in the mid LAD confirmed by IVUS (see pictures in the chart), proximal mid circumflex 95% short tubular followed by tubular 70% in the long OM branch, diffusely diseased RCA with at least 50-60% lesions throughout the proximal and mid segment worsening to 70 to 80% prior to the bifurcation into the posterior descending artery that appears to be occluded with to-and-fro flow from LAD septal collaterals and a severely stenotic right posterior atrioventricular groove vessel. 2.  Best option for this patient with diabetes and multivessel disease is coronary artery bypass grafting. 3.  Culprit lesion is most likely the circumflex 95% stenosis versus the right PDA. 4.   Preserved LV EF by echocardiogram with diastolic heart failure. 5.  Subtherapeutic P2Y12 platelet inhibition level. 6.  Diabetes, prior CVA with carotid stenting the  Plan: 1.  Cardiac Surgery consultation for consideration of multivessel CABG. Case discussed with Dr. Pamalee Leyden , who will assure the patient is seen and evaluated. This not urgent consultation as the patient has been on long-standing Plavix. 2.  Discontinue Plavix, restart heparin per protocol 6 hours post procedure.  The case and results was discussed with the patient and in great detail with the family -- son daughter and daughter-in-law, images were reviewed and options discussed. The case and results was not discussed with the patient's  PCP. The case and results was discussed with the patient's Cardiologist. CT surgery consultation was obtained.   Time Spend Directly with Patient:  45 minutes  Tinsley Everman W, M.D., M.S. [facility] [address] [place], [place]  [address]  [phone]  03/13/2011 1:13 AM

## 2011-03-13 NOTE — Progress Notes (Signed)
UR Completed.  Danielle [name] [phone] 03/13/2011  

## 2011-03-14 ENCOUNTER — Inpatient Hospital Stay (HOSPITAL_COMMUNITY): Payer: Medicare Other

## 2011-03-14 ENCOUNTER — Other Ambulatory Visit: Payer: Self-pay

## 2011-03-14 DIAGNOSIS — I2510 Atherosclerotic heart disease of native coronary artery without angina pectoris: Secondary | ICD-10-CM

## 2011-03-14 LAB — BASIC METABOLIC PANEL
BUN: 40 mg/dL — ABNORMAL HIGH (ref 6–23)
Creatinine, Ser: 1.41 mg/dL — ABNORMAL HIGH (ref 0.50–1.10)
GFR calc Af Amer: 41 mL/min — ABNORMAL LOW (ref >90)
GFR calc non Af Amer: 36 mL/min — ABNORMAL LOW (ref >90)

## 2011-03-14 LAB — CBC
HCT: 33.4 % — ABNORMAL LOW (ref 36.0–46.0)
MCHC: 32.9 g/dL (ref 30.0–36.0)
MCV: 91.3 fL (ref 78.0–100.0)
Platelets: 208 10*3/uL (ref 150–400)
RDW: 12.6 % (ref 11.5–15.5)
WBC: 10.8 10*3/uL — ABNORMAL HIGH (ref 4.0–10.5)

## 2011-03-14 LAB — GLUCOSE, CAPILLARY: Glucose-Capillary: 272 mg/dL — ABNORMAL HIGH (ref 70–99)

## 2011-03-14 LAB — HEPARIN LEVEL (UNFRACTIONATED): Heparin Unfractionated: 0.41 IU/mL (ref 0.30–0.70)

## 2011-03-14 MED ORDER — INSULIN GLARGINE 100 UNIT/ML ~~LOC~~ SOLN
34.0000 [IU] | Freq: Every day | SUBCUTANEOUS | Status: DC
  Administered 2011-03-14: 34 [IU] via SUBCUTANEOUS

## 2011-03-14 MED ORDER — INSULIN GLARGINE 100 UNIT/ML ~~LOC~~ SOLN
36.0000 [IU] | Freq: Every day | SUBCUTANEOUS | Status: DC
  Administered 2011-03-14 – 2011-03-15 (×2): 36 [IU] via SUBCUTANEOUS
  Filled 2011-03-14: qty 3

## 2011-03-14 NOTE — Progress Notes (Signed)
Patient ID: Danielle [name], female   DOB: 13-Oct-1934, 76 y.o.   MRN: [id number] Patient ID: Danielle [name], female   DOB: [birth year], 76 y.o.   MRN: [id number]   [facility]  Subjective: No one available to translate. Appears to be free of chest pain and dyspnea. Coughing.  Objective: Vital signs in last 24 hours: Temp:  [97.6 F (36.4 C)-98.6 F (37 C)] 97.6 F (36.4 C) (12/05 0439) Pulse Rate:  [72-89] 82  (12/05 0439) Resp:  [16] 16  (12/04 1850) BP: (103-151)/(55-81) 151/57 mmHg (12/05 0439) SpO2:  [92 %-100 %] 100 % (12/05 0439) Last BM Date: 03/11/11  Intake/Output from previous day: 12/04 0701 - 12/05 0700 In: 1020.5 [P.O.:740; I.V.:280.5] Out: 530 [Urine:530] Intake/Output this shift:    Medications Current Facility-Administered Medications  Medication Dose Route Frequency Provider Last Rate Last Dose  . acetaminophen (TYLENOL) tablet 650 mg  650 mg Oral Q4H PRN Erlene Quan, PA      . ALPRAZolam Duanne Moron) tablet 0.25 mg  0.25 mg Oral BID PRN Erlene Quan, PA   0.25 mg at 03/08/11 1411  . amLODipine (NORVASC) tablet 5 mg  5 mg Oral Daily Doreene Burke [place], [place]   5 mg at 03/13/11 1036  . furosemide (LASIX) tablet 40 mg  40 mg Oral BID Brett Canales, PA   40 mg at 03/13/11 1840  . heparin ADULT infusion 100 units/ml (25000 units/250 ml)  650 Units/hr Intravenous Continuous Leonie Man 6.5 mL/hr at 03/14/11 0600 650 Units/hr at 03/14/11 0600  . insulin aspart (novoLOG) injection 0-20 Units  0-20 Units Subcutaneous TID WC Brett Canales, PA   20 Units at 03/13/11 1846  . insulin aspart (novoLOG) injection 0-5 Units  0-5 Units Subcutaneous QHS Brett Canales, PA   3 Units at 03/13/11 2219  . insulin detemir (LEVEMIR) injection 30 Units  30 Units Subcutaneous BID Brett Canales, [place]   30 Units at 03/13/11 2220  . lactulose (CHRONULAC) 10 GM/15ML solution 20 g  20 g Oral Daily Isaiah Serge, NP   20 g at 03/12/11 1028  . levalbuterol (XOPENEX) nebulizer  solution 0.63 mg  0.63 mg Nebulization Q4H PRN Isaiah Serge, NP      . metoprolol tartrate (LOPRESSOR) tablet 12.5 mg  12.5 mg Oral BID Doreene Burke [place], PA   12.5 mg at 03/13/11 2219  . morphine 2 MG/ML injection 2 mg  2 mg Intravenous Q2H PRN Rana Snare, NP   2 mg at 03/08/11 0432  . nitroGLYCERIN 0.2 mg/mL in dextrose 5 % infusion  6 mcg/min Intravenous Continuous Leonie Man 1.8 mL/hr at 03/12/11 2148 6 mcg/min at 03/12/11 2148  . pantoprazole (PROTONIX) EC tablet 40 mg  40 mg Oral [address], [place]   40 mg at 03/13/11 1247  . polysaccharide iron (NIFEREX) capsule 150 mg  150 mg Oral Daily Isaiah Serge, NP   150 mg at 03/13/11 1037  . potassium chloride SA (K-DUR,KLOR-CON) CR tablet 20 mEq  20 mEq Oral Daily Isaiah Serge, NP   20 mEq at 03/13/11 1038  . simvastatin (ZOCOR) tablet 20 mg  20 mg Oral Daily Doreene Burke [place], [place]   20 mg at 03/13/11 2219  . zolpidem (AMBIEN) tablet 5 mg  5 mg Oral QHS PRN Erlene Quan, PA      . DISCONTD: furosemide (LASIX) injection 80 mg  80 mg Intravenous TID Mickel Baas  Rozell Searing, NP   80 mg at 03/13/11 0914  . DISCONTD: insulin aspart (novoLOG) injection 0-5 Units  0-5 Units Subcutaneous QHS Doreene Burke [place], [place]   3 Units at 03/12/11 2318  . DISCONTD: insulin aspart (novoLOG) injection 0-9 Units  0-9 Units Subcutaneous Q4H Isaiah Serge, NP   9 Units at 03/13/11 1247  . DISCONTD: insulin detemir (LEVEMIR) injection 15 Units  15 Units Subcutaneous BID Isaiah Serge, NP   15 Units at 03/13/11 0912    PE: General appearance: alert, cooperative and no distress Lungs: clear to auscultation bilaterally Heart: regular rate and rhythm 1/6 systolic MM. Extremities: No LEE Pulses: 2+ and symmetric  Lab Results:   Basename 03/14/11 0554 03/13/11 0615 03/12/11 0522  WBC 10.8* 10.3 9.9  HGB 11.0* 11.0* 10.6*  HCT 33.4* 34.6* 32.6*  PLT 208 221 233   BMET  Basename 03/14/11 0554 03/13/11 1117 03/12/11 0522  NA 140 138 141  K 3.8 3.9 3.4*  CL  95* 93* 92*  CO2 35* 36* 38*  GLUCOSE 246* 310* 234*  BUN 40* 34* 41*  CREATININE 1.41* 1.20* 1.37*  CALCIUM 9.0 9.5 9.4   PT/INR  Basename 03/12/11 0145  LABPROT 14.6  INR 1.12    Studies/Results: Left Heart Cath, 03/12/11  Hemodynamics:  Central Aortic Pressure / Mean Aortic Pressure: 141/57 mmHg; 90 mmHg  LV Pressure / LV End diastolic Pressure: 123456 mmHg, 40 mmHg  Left Ventriculography: Not performed to conserve contrast as the patient has had recent acute renal insufficiency.  Coronary Angiographic Data: Severe diffuse disease  Right dominant  Left Main: Short,large caliber angiographically normal  Left Anterior Descending (LAD): Moderate to large caliber, proximally calcified; gives rise to 2 small diagonal branches and several septal perforators.  First lesion: 75% by IVUS is a focal calcified lesion at the take off of a small D1  Second lesion: 70% by IVUS, midway between D1 and D2 at a septal perforator  Third lesion: 70% IVUS, just after small branching D2.  Remainder the LAD courses down around the apex and has diffuse 40-60% lesions distally  Circumflex (LCx): Large-caliber vessel that gives off several small marginal branches but basically terminates in a large lateral marginal branch that bifurcates reaching the apex. Small AV groove artery. There is a proximal irregular 60-70% lesion followed by a severe 95% stenosis just prior to the AV groove artery.  Large Lateral Obtuse Marginal: This is the following one of the main trunk of the circumflex, proximal tubular 70-80% lesion before this vessel courses no other significant disease to the apex.  Ramus Intermedius: N./A.  Right Coronary Artery: This is an extremely diffusely diseased vessel. The proximal vessel starts off as a large caliber vessel that has an abrupt tubular 50-60% lesion followed by a short relatively normal segment it tapers down from 40-70% following a short post stenotic dilatation segment. This is  her of the take off of an RV marginal branch. After this the vessel courses to the bifurcation with stent was worsening lesions of 60 To 80-90% tubular diffuse disease.  Right Posterior Descending Artery: Appears to be subtotally occluded proximally with to-and-fro flow flow from left to right collaterals via LAD septal perforators.  Right Posterior Lateral System: 95% proximal right atrial ventricular groove stenosis. Following this vessel bifurcates into a 2 posterior lateral branches. Prior to the stenosis there is a small posterior lateral versus double barrel RPDA. Impression:  1. Severe multivessel coronary disease: Sequential 75% followed by 2  sequential 70% lesions in the mid LAD confirmed by IVUS (see pictures in the chart), proximal mid circumflex 95% short tubular followed by tubular 70% in the long OM branch, diffusely diseased RCA with at least 50-60% lesions throughout the proximal and mid segment worsening to 70 to 80% prior to the bifurcation into the posterior descending artery that appears to be occluded with to-and-fro flow from LAD septal collaterals and a severely stenotic right posterior atrioventricular groove vessel.  2. Best option for this patient with diabetes and multivessel disease is coronary artery bypass grafting.  3. Culprit lesion is most likely the circumflex 95% stenosis versus the right PDA.  4. Preserved LV EF by echocardiogram with diastolic heart failure.  5. Subtherapeutic P2Y12 platelet inhibition level.  6. Diabetes, prior CVA with carotid stenting the    Assessment/Plan  Principal Problem:  *Respiratory failure with hypoxia - secondary to acute diastolic heart failure due to non-STEMI Active Problems:  Diastolic CHF, acute  Diabetes mellitus  HTN (hypertension), benign  Dyslipidemia  PVD (peripheral vascular disease)  CVA (cerebral infarction)  Obesity (BMI 30-39.9)  Chronic renal insufficiency, stage III (moderate)  Non-ST elevated myocardial  infarction (non-STEMI) - inferior lateral ST depression, peak troponin 16  Fever - documented  101  Community acquired pneumonia  UTI (lower urinary tract infection)  Plan:  Severe multivessel CAD.  Awaiting CTS consult. BP and HR stable and controlled. Afebrile. Borderline WBC. Ordering PT evaluation and treat.  Incentive spirometer.  Glucose level consistently high.  Repeat CXR. Note slight deterioration in renal function (post cath and diuretics) - will continue to monitor, diuretic dose has been reduced. Increase Lantus dose.  LOS: 8 days    Hazen Brumett 03/14/2011 8:42 AM

## 2011-03-14 NOTE — Progress Notes (Signed)
Physical Therapy Evaluation Patient Details Name: LUARA MCQUILLAN MRN: [id number] DOB: 12/01/1934 Today's Date: 03/14/2011  Problem List:  Patient Active Problem List  Diagnoses  . Bilateral knee pain  . Knee osteoarthritis  . Diastolic CHF, acute  . Diabetes mellitus  . HTN (hypertension), benign  . Dyslipidemia  . PVD (peripheral vascular disease)  . CVA (cerebral infarction)  . Obesity (BMI 30-39.9)  . Respiratory failure with hypoxia - secondary to acute diastolic heart failure due to non-STEMI  . Chronic renal insufficiency, stage III (moderate)  . Non-ST elevated myocardial infarction (non-STEMI) - inferior lateral ST depression, peak troponin 16  . Fever - documented  101  . Community acquired pneumonia  . UTI (lower urinary tract infection)    Past Medical History:  Past Medical History  Diagnosis Date  . Diabetes mellitus   . Hypertension   . Stroke   . CHF (congestive heart failure)   . Arthritis   . Headache   . History of seasonal allergies    Past Surgical History:  Past Surgical History  Procedure Date  . Cholecystectomy     PT Assessment/Plan/Recommendation PT Assessment Clinical Impression Statement: pt is a 76 y/o female admitted with SOB and determined to have suffered a NSTEMI; awaiting discussion with MD about need for OHS.   pt will benefit from acute PT and may need rehab before home if proceeds to CABG o/w can go home with PT i PT Recommendation/Assessment: Patient will need skilled PT in the acute care venue PT Problem List: Decreased strength;Decreased activity tolerance;Decreased balance;Decreased mobility;Decreased knowledge of use of DME;Decreased knowledge of precautions;Cardiopulmonary status limiting activity PT Therapy Diagnosis : Generalized weakness PT Plan PT Frequency: Min 3X/week PT Treatment/Interventions: DME instruction;Gait training;Stair training;Functional mobility training;Therapeutic activities;Balance training;Patient/family  education PT Recommendation Follow Up Recommendations: Home health PT;Other (comment) (unless proceeds to OHS then may need rehab before d/c home) Equipment Recommended: Defer to next venue PT Goals  Acute Rehab PT Goals PT Goal Formulation: With patient/family Time For Goal Achievement: 2 weeks Pt will go Supine/Side to Sit: with supervision PT Goal: Supine/Side to Sit - Progress: Other (comment) Pt will go Sit to Stand: with supervision PT Goal: Sit to Stand - Progress: Other (comment) Pt will go Stand to Sit: with supervision PT Goal: Stand to Sit - Progress: Other (comment) Pt will Transfer Bed to Chair/Chair to Bed: with supervision PT Transfer Goal: Bed to Chair/Chair to Bed - Progress: Other (comment) Pt will Ambulate: >150 feet;with supervision;with least restrictive assistive device PT Goal: Ambulate - Progress: Other (comment)  PT Evaluation Precautions/Restrictions  Precautions Precautions: Fall Required Braces or Orthoses: No Restrictions Weight Bearing Restrictions: No Prior Functioning  Home Living Lives With: Spouse;Family Receives Help From: Family Type of Home: House Home Layout: Two level;Full bath on main level;Able to live on main level with bedroom/bathroom Alternate Level Stairs-Rails: Right;Left Alternate Level Stairs-Number of Steps: 14 Home Access: Stairs to enter Entrance Stairs-Rails:  (not addressed) Entrance Stairs-Number of Steps: 2 Bathroom Toilet: Standard Home Adaptive Equipment: Walker - rolling Prior Function Level of Independence: Independent with transfers;Independent with gait;Needs assistance with ADLs;Needs assistance with homemaking Able to Take Stairs?: Yes Cognition Cognition Arousal/Alertness: Awake/alert Overall Cognitive Status: Appears within functional limits for tasks assessed Orientation Level: Oriented X4 Sensation/Coordination Coordination Gross Motor Movements are Fluid and Coordinated: Yes Fine Motor Movements are  Fluid and Coordinated: Not tested Extremity Assessment RUE Assessment RUE Assessment: Within Functional Limits LUE Assessment LUE Assessment: Within Functional Limits (bil UE's mildly  weak, but functional) RLE Assessment RLE Assessment: Within Functional Limits LLE Assessment LLE Assessment:  (Bil LE's mildly weak at 4/5, but functional) Mobility (including Balance) Bed Mobility Bed Mobility: No Transfers Transfers: Yes Sit to Stand: 4: Min assist;From chair/3-in-1 Stand to Sit: Other (comment) (min guard A) Ambulation/Gait Ambulation/Gait: Yes Ambulation/Gait Assistance: 4: Min assist Ambulation/Gait Assistance Details (indicate cue type and reason): pt wanted to reach for and use the rails, but was only mildly unsteady holding just to the IV pole Ambulation Distance (Feet): 80 Feet Assistive device: Other (Comment) (IV pole and rail occaisionally) Gait Pattern: Step-through pattern;Decreased step length - right;Decreased step length - left;Decreased stride length    Exercise    End of Session PT - End of Session Activity Tolerance: Patient tolerated treatment well Patient left: in chair;with family/visitor present;with call bell in reach Nurse Communication: Mobility status for ambulation General Behavior During Session: [facility] for tasks performed Cognition: [facility] for tasks performed  Sathvika Ojo, Tessie Fass 03/14/2011, 1:08 PM  03/14/2011  Donnella Sham, PT [phone] (pager)

## 2011-03-14 NOTE — Consult Note (Signed)
[address]            [place] [address]          [phone]       Khristin N Wilemon [facility] Medical Record [id number] Date of Birth: Aug 19, 1934  Referring cardiologist:  Dr. Glenetta Hew No primary provider on file.  Chief Complaint:    Chief Complaint  Patient presents with  . Respiratory Distress    History of Present Illness:      HPI:   Pt. Profile: 76 y/o [name] with no prior history of CAD or CHF, sent from Primary Care MD office with increasing SOB. On arrival to ED she was in respiratory distress with O2 sats in the 50s. She was treated with IV Lasix and BiPap with improvement. Her family says she has been suffering from URI for past few days. Last night symptoms progressed to orthopnea. There is no history from family of chest pain. Family has noted a progressive decline in her functional status over the past several months and now family has to give her a bath and do most of her daily activities for her.  She has had problems with her balance and stability when walking. The patient speaks no [place].    Past Medical History  Diagnosis Date  . Diabetes mellitus   . Hypertension   . Stroke, s/p left carotid stenting in [place] 11/2009 after a stroke in [place]   . CHF (congestive heart failure)   . Arthritis   . Headache   . History of seasonal allergies     Past Surgical History  Procedure Date  . Cholecystectomy     History  Smoking status  . Never Smoker   Smokeless tobacco  . Never Used    History  Alcohol Use No    History   Social History  . Marital Status: Single    Spouse Name: N/A    Number of Children: N/A  . Years of Education: N/A   Occupational History  . Not on file.   Social History Main Topics  . Smoking status: Never Smoker   . Smokeless tobacco: Never Used  . Alcohol Use: No  . Drug Use: No  . Sexually Active: No   Other Topics Concern  . Not on file   Social History Narrative   . No narrative on file    No Known Allergies  Current Facility-Administered Medications  Medication Dose Route Frequency Provider Last Rate Last Dose  . acetaminophen (TYLENOL) tablet 650 mg  650 mg Oral Q4H PRN Erlene Quan, PA      . ALPRAZolam Duanne Moron) tablet 0.25 mg  0.25 mg Oral BID PRN Erlene Quan, PA   0.25 mg at 03/08/11 1411  . amLODipine (NORVASC) tablet 5 mg  5 mg Oral Daily Doreene Burke [address], [place]   5 mg at 03/14/11 [id number]  . furosemide (LASIX) tablet 40 mg  40 mg Oral BID Brett Canales, PA   40 mg at 03/14/11 [id number]  . heparin ADULT infusion 100 units/ml (25000 units/250 ml)  650 Units/hr Intravenous Continuous Leonie Man 6.5 mL/hr at 03/14/11 1203 650 Units/hr at 03/14/11 1203  . insulin aspart (novoLOG) injection 0-20 Units  0-20 Units Subcutaneous TID WC Brett Canales, PA   20 Units at 03/14/11 1243  . insulin aspart (novoLOG) injection 0-5 Units  0-5 Units Subcutaneous QHS Brett Canales, PA   3 Units at 03/13/11 2219  . insulin glargine (LANTUS) injection 34 Units  34 Units Subcutaneous QHS Mihai Croitoru      . insulin glargine (LANTUS) injection 36 Units  36 Units Subcutaneous Daily Mihai Croitoru   36 Units at 03/14/11 1134  . lactulose (CHRONULAC) 10 GM/15ML solution 20 g  20 g Oral Daily Isaiah Serge, NP   20 g at 03/12/11 1028  . levalbuterol (XOPENEX) nebulizer solution 0.63 mg  0.63 mg Nebulization Q4H PRN Isaiah Serge, NP      . metoprolol tartrate (LOPRESSOR) tablet 12.5 mg  12.5 mg Oral BID Doreene Burke [place], PA   12.5 mg at 03/14/11 [id number]  . morphine 2 MG/ML injection 2 mg  2 mg Intravenous Q2H PRN Rana Snare, NP   2 mg at 03/08/11 0432  . nitroGLYCERIN 0.2 mg/mL in dextrose 5 % infusion  6 mcg/min Intravenous Continuous Leonie Man 1.8 mL/hr at 03/12/11 2148 6 mcg/min at 03/12/11 2148  . pantoprazole (PROTONIX) EC tablet 40 mg  40 mg Oral [address] [place]   40 mg at 03/14/11 1244  . polysaccharide iron (NIFEREX) capsule 150 mg  150 mg Oral  Daily Isaiah Serge, NP   150 mg at 03/14/11 [id number]  . potassium chloride SA (K-DUR,KLOR-CON) CR tablet 20 mEq  20 mEq Oral Daily Isaiah Serge, NP   20 mEq at 03/14/11 [id number]  . simvastatin (ZOCOR) tablet 20 mg  20 mg Oral Daily Doreene Burke [place], [place]   20 mg at 03/13/11 2219  . zolpidem (AMBIEN) tablet 5 mg  5 mg Oral QHS PRN Erlene Quan, PA      . DISCONTD: insulin aspart (novoLOG) injection 0-5 Units  0-5 Units Subcutaneous QHS Doreene Burke [place], [place]   3 Units at 03/12/11 2318  . DISCONTD: insulin aspart (novoLOG) injection 0-9 Units  0-9 Units Subcutaneous Q4H Isaiah Serge, NP   9 Units at 03/13/11 1247  . DISCONTD: insulin detemir (LEVEMIR) injection 30 Units  30 Units Subcutaneous BID Brett Canales, [place]   30 Units at 03/13/11 2220     History reviewed. No pertinent family history.   Review of Systems:     Cardiac Review of Systems: Y or N  Chest Pain [  n  ]  Resting SOB Blue.Reese  ] Exertional SOB  [ y ]  Vertell Limber Blue.Reese ]   Pedal Edema Blue.Reese ]    Palpitations [ n ] Syncope  Florencio.Farrier  ]  Presyncope Florencio.Farrier ]   General Review of Systems: [Y] = yes [  ]=no  Constitional: recent weight change [y]; anorexia [ n ]; fatigue [ y ]; nausea [n ]; night sweats [n  ]; fever [n  ]; or chills [ n ];  Dental: poor dentition[n  ];   Eye : blurred vision [n ]; diplopia Florencio.Farrier ]; vision changes [ n ];  Amaurosis fugax[ n];  Resp: cough [n  ];  wheezing[ n ];  hemoptysis[ n ]; shortness of breath[y ]; paroxysmal nocturnal dyspnea[ n ]; dyspnea on exertion[ y]; or orthopnea[ y ];   GI:  gallstones[ n], vomiting[ n ];  dysphagia[n]; melena[ n ];  hematochezia [n ]; heartburn[ n ];   Hx of  Colonoscopy[  n];  GU: kidney stones [n  ]; hematuria[n  ];   dysuria [n ];  nocturia[  n];  history of     obstruction [n  ];              Skin: rash, swelling[ n ];, hair loss[n];  peripheral edema[n];  or  itching[ n ];  Musculosketetal: myalgias[ n ];  joint swelling[ n ];  joint erythema[ n ];  joint pain[ n ];  back pain[ n ];   Heme/Lymph: bruising[ n ];  bleeding[ n;  anemia[ n ];   Neuro: TIA[ n ];  headaches[  n];  stroke[ y];  vertigo[ n ];  seizures[ n ];   paresthesias[  n];  difficulty walking[y  ];   Psych:depression[ n]; anxiety[ n ];   Endocrine: diabetes[  n  thyroid dysfunction[ n ]   Immunizations: Flu [ n ]; Pneumococcal[ n ];   Other:  Physical Exam: BP 151/57  Pulse 82  Temp(Src) 97.6 F (36.4 C) (Oral)  Resp 16  Ht 5\' 1"  (1.549 m)  Wt 79 kg (174 lb 2.6 oz)  BMI 32.91 kg/m2  SpO2 100%  General appearance: alert and fatigued HEENT: Normocephalic and atraumatic. Pupils are equal and reactive to light. Extraocular muscles are intact. Oropharynx is clear. Neck: Carotid pulses are palpable bilaterally. There no bruits. There is no cervical or supraclavicular adenopathy. There is no thyromegaly. Neurologic: intact Heart: regular rate and rhythm, S1, S2 normal, no murmur, click, rub or gallop Lungs: rales bibasilar Abdomen: soft, non-tender; bowel sounds normal; no masses,  no organomegaly Extremities:  anasarca persists   Diagnostic Studies & Laboratory data:  Studies/Results: Left Heart Cath, 03/12/11  Hemodynamics:   Central Aortic Pressure / Mean Aortic Pressure: 141/57 mmHg; 90 mmHg   LV Pressure / LV End diastolic Pressure: 123456 mmHg, 40 mmHg   Left Ventriculography: Not performed to conserve contrast as the patient has had recent acute renal insufficiency.  Coronary Angiographic Data: Severe diffuse disease   Right dominant   Left Main: Short,large caliber angiographically normal     Left Anterior Descending (LAD): Moderate to large caliber, proximally calcified; gives rise to 2 small diagonal branches and several septal perforators.     First lesion: 75% by IVUS is a focal calcified lesion at the take off of a small D1     Second lesion: 70%  by IVUS, midway between D1 and D2 at a septal perforator     Third lesion: 70% IVUS, just after small branching D2.    Remainder the LAD courses down around the apex and has diffuse 40-60% lesions distally     Circumflex (LCx): Large-caliber vessel that gives off several small marginal branches but basically terminates in a large lateral marginal branch that bifurcates reaching the apex. Small AV groove artery. There is a proximal irregular 60-70% lesion followed by a severe 95% stenosis just prior to the AV groove artery.   Large Lateral Obtuse Marginal: This is the following one of  the main trunk of the circumflex, proximal tubular 70-80% lesion before this vessel courses no other significant disease to the apex.     Ramus Intermedius: N./A.     Right Coronary Artery: This is an extremely diffusely diseased vessel. The proximal vessel starts off as a large caliber vessel that has an abrupt tubular 50-60% lesion followed by a short relatively normal segment it tapers down from 40-70% following a short post stenotic dilatation segment. This is her of the take off of an RV marginal branch. After this the vessel courses to the bifurcation with stent was worsening lesions of 60 To 80-90% tubular diffuse disease.     Right Posterior Descending Artery: Appears to be subtotally occluded proximally with to-and-fro flow flow from left to right collaterals via LAD septal perforators.     Right Posterior Lateral System: 95% proximal right atrial ventricular groove stenosis. Following this vessel bifurcates into a 2 posterior lateral branches. Prior to the stenosis there is a small posterior lateral versus double barrel RPDA. Impression:   1. Severe multivessel coronary disease: Sequential 75% followed by 2 sequential 70% lesions in the mid LAD confirmed by IVUS (see pictures in the chart), proximal mid circumflex 95% short tubular followed by tubular 70% in the long OM branch, diffusely diseased RCA with at  least 50-60% lesions throughout the proximal and mid segment worsening to 70 to 80% prior to the bifurcation into the posterior descending artery that appears to be occluded with to-and-fro flow from LAD septal collaterals and a severely stenotic right posterior atrioventricular groove vessel.   2. Best option for this patient with diabetes and multivessel disease is coronary artery bypass grafting.   3. Culprit lesion is most likely the circumflex 95% stenosis versus the right PDA.   4. Preserved LV EF by echocardiogram with diastolic heart failure.   5. Subtherapeutic P2Y12 platelet inhibition level.   6. Diabetes, prior CVA with carotid stenting the  *[place] [employer] [place], [place] [address] [phone]    ------------------------------------------------------------ Transthoracic Echocardiography  Patient: Amena, [place] MR #: [id number] Study Date: 03/07/2011 Gender: F Age: 26 Height: 154.9cm Weight: 88.2kg BSA: 1.24m^2 Pt. Status: Room: RESB  PERFORMING Shvc ORDERING Erlene Quan. REFERRING Kerin Ransom K. SONOGRAPHER Tresa Res, RDCS ADMITTING Debara Pickett Darlyne Russian Lyman Bishop cc:  ------------------------------------------------------------ LV EF: 55% - 60%  ------------------------------------------------------------ Indications: MI - acute 410.91.  ------------------------------------------------------------ History: Risk factors: Hypertension. Diabetes mellitus.  ------------------------------------------------------------ Study Conclusions  - Left ventricle: The cavity size was normal. Wall thickness was increased in a pattern of moderate LVH. There was moderate concentric hypertrophy. Systolic function was normal. The estimated ejection fraction was in the range of 55% to 60%. Possible mild posterolateral hypokinesis cannot be excluded. Doppler parameters are consistent with abnormal left  ventricular relaxation (grade 1 diastolic dysfunction). Doppler parameters are consistent with elevated mean left atrial filling pressure. - Mitral valve: Moderately to severely calcified posterior MV annulus. Mild regurgitation. - Right ventricle: Systolic pressure was increased. - Pulmonary arteries: PA peak pressure: 25mm Hg (S). Impressions:  - The right ventricular systolic pressure was increased consistent with mild pulmonary hypertension. Transthoracic echocardiography. M-mode, complete 2D, spectral Doppler, and color Doppler. Height: Height: 154.9cm. Height: 61in. Weight: Weight: 88.2kg. Weight: 194lb. Body mass index: BMI: 36.7kg/m^2. Body surface area: BSA: 1.82m^2. Blood pressure: 134/63. Patient status: Inpatient. Location: ICU/CCU  ------------------------------------------------------------  ------------------------------------------------------------ Left ventricle: The cavity size was normal. Wall thickness was increased in a pattern  of moderate LVH. There was moderate concentric hypertrophy. Systolic function was normal. The estimated ejection fraction was in the range of 55% to 60%. Possible mild posterolateral hypokinesis cannot be excluded. Early diastolic septal annular tissue Doppler velocities Ea were abnormal. Doppler parameters are consistent with abnormal left ventricular relaxation (grade 1 diastolic dysfunction). Doppler parameters are consistent with elevated mean left atrial filling pressure.  ------------------------------------------------------------ Aortic valve: Trileaflet; mildly thickened, mildly calcified leaflets. Sclerosis without stenosis. Doppler: No regurgitation.  ------------------------------------------------------------ Aorta: The aorta was normal, not dilated, and non-diseased.  ------------------------------------------------------------ Mitral valve: Moderately to severely calcified posterior MV annulus. Doppler: Mild  regurgitation. Valve area by pressure half-time: 4.58cm^2. Indexed valve area by pressure half-time: 2.3cm^2/m^2. Mean gradient: 109mm Hg (D). Peak gradient: 33mm Hg (D).  ------------------------------------------------------------ Left atrium: The atrium was at the upper limits of normal in size.  ------------------------------------------------------------ Right ventricle: The cavity size was normal. Systolic function was normal. Systolic pressure was increased.  ------------------------------------------------------------ Pulmonic valve: Poorly visualized.  ------------------------------------------------------------ Tricuspid valve: Doppler: No significant regurgitation.  ------------------------------------------------------------ Pulmonary artery: Poorly visualized.  ------------------------------------------------------------ Right atrium: Poorly visualized.  ------------------------------------------------------------ Pericardium: There was no pericardial effusion.  ------------------------------------------------------------ Post procedure conclusions Ascending Aorta:  - The aorta was normal, not dilated, and non-diseased.  ------------------------------------------------------------  2D measurements Normal Doppler measurements Normal Left ventricle Main pulmonary LVID ED, 41 mm 43-52 artery chord, Pressure, 32 mm Hg =30 PLAX S LVID ES, 32 mm 23-38 Left ventricle chord, Ea, lat 5.17 cm/s ------ PLAX ann, tiss FS, chord, 22 % >29 DP PLAX E/Ea, lat 26.3 ------ LVPW, ED 14 mm ------ ann, tiss 1 IVS/LVPW 1 <1.3 DP ratio, ED Ea, med 4.97 cm/s ------ Ventricular septum ann, tiss IVS, ED 14 mm ------ DP Aorta E/Ea, med 27.3 ------ Root diam 28 mm ------ ann, tiss 6 Root diam, 28 mm ------ DP ED LVOT Left atrium Peak vel, 88.4 cm/s ------ AP dim 38 mm ------ S AP dim 1.91 cm/m^2 <2.2 VTI, S 21 cm ------ index Mitral valve Peak E vel 136 cm/s ------ Peak A  vel 149 cm/s ------ Mean vel, 116 cm/s ------ D Decelerati 204 ms 150-23 on time 0 Pressure 48 ms ------ half-time Mean 7 mm Hg ------ gradient, D Peak 12 mm Hg ------ gradient, D Peak E/A 0.9 ------ ratio Area (PHT) 4.58 cm^2 ------ Area index 2.3 cm^2/m ------ (PHT) ^2 Annulus 37 cm ------ VTI Tricuspid valve Regurg 235 cm/s ------ peak vel Peak RV-RA 22 mm Hg ------ gradient, S Systemic veins Estimated 10 mm Hg ------ CVP Right ventricle Pressure, 32 mm Hg <30 S  ------------------------------------------------------------ Prepared and Electronically Authenticated by  Shelva Majestic, MD [facility]      Recent Radiology Findings:   Dg Chest 2 View  03/14/2011  *RADIOLOGY REPORT*  Clinical Data: Cough.  CHEST - 2 VIEW  Comparison: 03/10/2011  Findings: Cardiomegaly with mild vascular congestion.  Interstitial edema pattern has improved.  Right base atelectasis persists.  No effusions.  IMPRESSION: Further improvement in interstitial edema pattern.  Right base atelectasis.  Original Report Authenticated By: Raelyn Number, M.D.      Recent Lab Findings: Lab Results  Component Value Date   WBC 10.8* 03/14/2011   HGB 11.0* 03/14/2011   HCT 33.4* 03/14/2011   PLT 208 03/14/2011   GLUCOSE 246* 03/14/2011   CHOL 116 03/07/2011   TRIG 100 03/07/2011   HDL 60 03/07/2011   LDLCALC 36 03/07/2011   ALT 15 03/09/2011   AST 30 03/09/2011   NA 140  03/14/2011   K 3.8 03/14/2011   CL 95* 03/14/2011   CREATININE 1.41* 03/14/2011   BUN 40* 03/14/2011   CO2 35* 03/14/2011   TSH 0.683 03/06/2011   INR 1.12 03/12/2011   HGBA1C 6.6* 03/06/2011      Assessment / Plan:      Mrs. Shortall has severe multivessel coronary disease and diabetes with good left ventricular function and presented with non-ST segment elevation MI and congestive heart failure with pulmonary edema and anasarca. I agree that coronary bypass graft surgery is typically the best treatment for a diabetic patient with  multivessel coronary disease. I think she would have a very difficult recovery given her preoperative debilitation, not being able to walk very well, and already requiring her family to do most of her activities of daily living. I spent a long time discussing surgery with the patient's daughter acting as a Optometrist. Her daughter is concerned about whether she will have a good quality of life after surgery. I think that is a reasonable concern given her functional state before surgery. Her daughter was asking me whether percutaneous intervention would be a reasonable option for treating her mother and I told her that she would have discuss that further with Dr. Ellyn Hack. The left circumflex is certainly amenable to stenting. The LAD has serial 70% stenoses by IVUS but is not critically narrowed and is diffusely diseased distally. The right coronary artery really does not appear amenable to percutaneous intervention and may not even be graftable with bypass surgery given the diffuseness of the disease and relatively small distal branches in a diabetic patient. I told her I will discuss the case further with Dr. Ellyn Hack so we can decide whether percutaneous intervention was even an option for them to consider. If not, then I would recommend bypass surgery with the understanding that it is going to be a very slow postoperative recovery and she may end up needing to go to a skilled nursing facility afterwards.  She was also noted to have a urinary tract infection with greater than 100,000 Proteus in a urine culture on admission. She was given a dose of ceftriaxone but has not had a followup urine culture and still has a Foley catheter in place. We would need to be sure that she does not have a urinary tract infection before proceeding with surgery.      @me1 @ 03/14/2011 4:49 PM

## 2011-03-14 NOTE — Progress Notes (Signed)
Inpatient Diabetes Program Recommendations  AACE/ADA: New Consensus Statement on Inpatient Glycemic Control (2009)  Target Ranges:  Prepandial:   less than 140 mg/dL      Peak postprandial:   less than 180 mg/dL (1-2 hours)      Critically ill patients:  140 - 180 mg/dL   MD-patient takes Levemir at home but switched to Lantus this morning?  Can he continue his home dose of Levemir? Thanks Raoul Pitch RN, Diabetes Coordinator [phone]

## 2011-03-14 NOTE — Progress Notes (Signed)
ANTICOAGULATION CONSULT NOTE - Follow Up Consult  Pharmacy Consult for heparin  Indication: chest pain/ACS and awaiting OHS  No Known Allergies  Patient Measurements: Height: 5\' 1"  (154.9 cm) Weight: 174 lb 2.6 oz (79 kg) IBW/kg (Calculated) : 47.8   Vital Signs: Temp: 97.6 F (36.4 C) (12/05 0439) Temp src: Oral (12/05 0439) BP: 151/57 mmHg (12/05 0439) Pulse Rate: 82  (12/05 0439)  Labs:  Basename 03/14/11 0554 03/13/11 1117 03/13/11 0615 03/12/11 0901 03/12/11 0522 03/12/11 0145  HGB 11.0* -- 11.0* -- -- --  HCT 33.4* -- 34.6* -- 32.6* --  PLT 208 -- 221 -- 233 --  APTT -- -- -- -- -- --  LABPROT -- -- -- -- -- 14.6  INR -- -- -- -- -- 1.12  HEPARINUNFRC 0.41 -- 0.34 0.36 -- --  CREATININE 1.41* 1.20* -- -- 1.37* --  CKTOTAL -- -- -- -- -- --  CKMB -- -- -- -- -- --  TROPONINI -- -- -- -- -- --   Estimated Creatinine Clearance: 33.3 ml/min (by C-G formula based on Cr of 1.41).   Medications:  Scheduled:    . amLODipine  5 mg Oral Daily  . furosemide  40 mg Oral BID  . insulin aspart  0-20 Units Subcutaneous TID WC  . insulin aspart  0-5 Units Subcutaneous QHS  . insulin glargine  34 Units Subcutaneous QHS  . insulin glargine  36 Units Subcutaneous Daily  . lactulose  20 g Oral Daily  . metoprolol tartrate  12.5 mg Oral BID  . pantoprazole  40 mg Oral Q1200  . polysaccharide iron  150 mg Oral Daily  . potassium chloride  20 mEq Oral Daily  . simvastatin  20 mg Oral Daily  . DISCONTD: insulin aspart  0-5 Units Subcutaneous QHS  . DISCONTD: insulin aspart  0-9 Units Subcutaneous Q4H  . DISCONTD: insulin detemir  30 Units Subcutaneous BID   Infusions:    . heparin 650 Units/hr (03/14/11 0600)  . nitroGLYCERIN 6 mcg/min (03/12/11 2148)    Assessment: 15 yof s/p cardiac cath awaiting possible CABG on IV heparin. Heparin level is therapeutic at 0.41. No bleeding noted. CBC stable.   Goal of Therapy:  Heparin level 0.3-0.7 units/ml   Plan:  Continue  heparin at 650units/hr F/u AM heparin level F/u OHS plans Danielle [name], Rande Lawman 03/14/2011,11:39 AM

## 2011-03-15 ENCOUNTER — Encounter (HOSPITAL_COMMUNITY): Payer: Medicare Other

## 2011-03-15 DIAGNOSIS — Z01810 Encounter for preprocedural cardiovascular examination: Secondary | ICD-10-CM

## 2011-03-15 LAB — URINALYSIS, ROUTINE W REFLEX MICROSCOPIC
Bilirubin Urine: NEGATIVE
Ketones, ur: NEGATIVE mg/dL
Nitrite: NEGATIVE
Protein, ur: NEGATIVE mg/dL
pH: 6 (ref 5.0–8.0)

## 2011-03-15 LAB — GLUCOSE, CAPILLARY
Glucose-Capillary: 333 mg/dL — ABNORMAL HIGH (ref 70–99)
Glucose-Capillary: 339 mg/dL — ABNORMAL HIGH (ref 70–99)
Glucose-Capillary: 296 mg/dL — ABNORMAL HIGH (ref 70–99)
Glucose-Capillary: 282 mg/dL — ABNORMAL HIGH (ref 70–99)

## 2011-03-15 LAB — CBC
HCT: 32.9 % — ABNORMAL LOW (ref 36.0–46.0)
Hemoglobin: 11 g/dL — ABNORMAL LOW (ref 12.0–15.0)
RBC: 3.62 MIL/uL — ABNORMAL LOW (ref 3.87–5.11)
WBC: 10.2 10*3/uL (ref 4.0–10.5)

## 2011-03-15 LAB — BASIC METABOLIC PANEL
CO2: 34 mEq/L — ABNORMAL HIGH (ref 19–32)
Chloride: 96 mEq/L (ref 96–112)
GFR calc non Af Amer: 35 mL/min — ABNORMAL LOW (ref >90)
Glucose, Bld: 268 mg/dL — ABNORMAL HIGH (ref 70–99)
Potassium: 4 mEq/L (ref 3.5–5.1)
Sodium: 137 mEq/L (ref 135–145)

## 2011-03-15 LAB — HEPARIN LEVEL (UNFRACTIONATED): Heparin Unfractionated: 0.56 IU/mL (ref 0.30–0.70)

## 2011-03-15 MED ORDER — INSULIN DETEMIR 100 UNIT/ML ~~LOC~~ SOLN
36.0000 [IU] | Freq: Every day | SUBCUTANEOUS | Status: DC
  Administered 2011-03-17: 36 [IU] via SUBCUTANEOUS
  Filled 2011-03-15: qty 3

## 2011-03-15 MED ORDER — INSULIN DETEMIR 100 UNIT/ML ~~LOC~~ SOLN
34.0000 [IU] | Freq: Every day | SUBCUTANEOUS | Status: DC
  Administered 2011-03-15 – 2011-03-16 (×2): 34 [IU] via SUBCUTANEOUS

## 2011-03-15 MED ORDER — SODIUM CHLORIDE 0.9 % IJ SOLN: 3.0000 mL | INTRAMUSCULAR | Status: DC | PRN

## 2011-03-15 MED ORDER — SODIUM CHLORIDE 0.9 % IV SOLN
1.0000 mL/kg/h | INTRAVENOUS | Status: DC
  Administered 2011-03-16 (×2): 1 mL/kg/h via INTRAVENOUS

## 2011-03-15 MED ORDER — ASPIRIN 81 MG PO CHEW
81.0000 mg | CHEWABLE_TABLET | Freq: Every day | ORAL | Status: DC
  Administered 2011-03-15: 81 mg via ORAL
  Filled 2011-03-15: qty 1

## 2011-03-15 MED ORDER — TICAGRELOR 90 MG PO TABS
180.0000 mg | ORAL_TABLET | Freq: Once | ORAL | Status: AC
  Administered 2011-03-15: 180 mg via ORAL
  Filled 2011-03-15 (×2): qty 2

## 2011-03-15 MED ORDER — SODIUM CHLORIDE 0.9 % IV SOLN: 250.0000 mL | INTRAVENOUS | Status: DC | PRN

## 2011-03-15 MED ORDER — SODIUM CHLORIDE 0.9 % IJ SOLN
3.0000 mL | Freq: Two times a day (BID) | INTRAMUSCULAR | Status: DC
  Administered 2011-03-16: 3 mL via INTRAVENOUS

## 2011-03-15 MED ORDER — ASPIRIN 81 MG PO CHEW
324.0000 mg | CHEWABLE_TABLET | ORAL | Status: AC
  Administered 2011-03-16: 324 mg via ORAL
  Filled 2011-03-15: qty 4

## 2011-03-15 NOTE — Progress Notes (Signed)
Obtained informed consent via [place] interpreter for PCI in am. Sammie Bench

## 2011-03-15 NOTE — Progress Notes (Addendum)
*  PRELIMINARY RESULTS*  Carotid Dopplers for Pre-CABG  has been performed.  No obvious significant ICA stenosis bilaterally. Vertebral arteries are patent with antegrade flow.    Rich Brave 03/15/2011, 11:41 AM

## 2011-03-15 NOTE — Progress Notes (Signed)
Patient ID: Danielle [name], female   DOB: February 19, 1935, 76 y.o.   MRN: [id number]   [facility]  Subjective: Looks great today feeling good.  She wants to go home. Family present to discuss plan.  Objective: Vital signs in last 24 hours: Temp:  [97.6 F (36.4 C)-98.3 F (36.8 C)] 97.6 F (36.4 C) (12/06 1230) Pulse Rate:  [65-72] 72  (12/06 1230) Resp:  [15-20] 18  (12/06 1230) BP: (111-154)/(43-85) 111/85 mmHg (12/06 1230) SpO2:  [92 %-100 %] 92 % (12/06 1230) Weight:  [83.6 kg (184 lb 4.9 oz)] 184 lb 4.9 oz (83.6 kg) (12/06 0552) Last BM Date: 03/14/11 Physical exam: General appearance: alert, cooperative, no distress and morbidly obese Neck: no adenopathy, no carotid bruit, supple, symmetrical, trachea midline, thyroid not enlarged, symmetric, no tenderness/mass/nodules and Unable to assess jugular venous distention due to body habitus. Lungs: clear to auscultation bilaterally and Except for fine basal crackles, Nonlabored Heart: regular rate and rhythm, S1, S2 normal, no murmur, click, rub or gallop Abdomen: soft, non-tender; bowel sounds normal; no masses,  no organomegaly and Obese Extremities: extremities normal, atraumatic, no cyanosis or edema and edema Trace Pulses: 1+ symmetric Skin: Skin color, texture, turgor normal. No rashes or lesions Neurologic: Alert and oriented X 3, normal strength and tone. Normal symmetric reflexes. Normal coordination and gait Significant improved from precath. Right femoral artery access site clean dry and intact no tenderness. Foley catheter still in place, urine clear.  Intake/Output from previous day: 12/05 0701 - 12/06 0700 In: 664.5 [P.O.:480; I.V.:184.5] Out: 2050 [Urine:2050] Intake/Output this shift: Total I/O In: 520 [P.O.:520] Out: 400 [Urine:400]  Medications Current Facility-Administered Medications  Medication Dose Route Frequency Provider Last Rate Last Dose  . acetaminophen (TYLENOL) tablet  650 mg  650 mg Oral Q4H PRN Erlene Quan, PA      . ALPRAZolam Duanne Moron) tablet 0.25 mg  0.25 mg Oral BID PRN Erlene Quan, PA   0.25 mg at 03/08/11 1411  . amLODipine (NORVASC) tablet 5 mg  5 mg Oral Daily Doreene Burke [place], [place]   5 mg at 03/15/11 1306  . furosemide (LASIX) tablet 40 mg  40 mg Oral BID Brett Canales, PA   40 mg at 03/15/11 0915  . heparin ADULT infusion 100 units/ml (25000 units/250 ml)  650 Units/hr Intravenous Continuous Leonie Man 6.5 mL/hr at 03/15/11 0606 6.5 mL/hr at 03/15/11 0606  . insulin aspart (novoLOG) injection 0-20 Units  0-20 Units Subcutaneous TID WC Brett Canales, PA   15 Units at 03/15/11 1303  . insulin aspart (novoLOG) injection 0-5 Units  0-5 Units Subcutaneous QHS Brett Canales, PA   4 Units at 03/14/11 2220  . insulin detemir (LEVEMIR) injection 34 Units  34 Units Subcutaneous QHS Leonie Man      . insulin detemir (LEVEMIR) injection 36 Units  36 Units Subcutaneous QPC breakfast Leonie Man      . lactulose (CHRONULAC) 10 GM/15ML solution 20 g  20 g Oral Daily Isaiah Serge, NP   20 g at 03/15/11 1305  . levalbuterol (XOPENEX) nebulizer solution 0.63 mg  0.63 mg Nebulization Q4H PRN Isaiah Serge, NP      . metoprolol tartrate (LOPRESSOR) tablet 12.5 mg  12.5 mg Oral BID Erlene Quan, PA   12.5 mg at 03/15/11 1306  . morphine 2 MG/ML injection 2 mg  2 mg Intravenous Q2H PRN Rana Snare, NP   2  mg at 03/08/11 0432  . nitroGLYCERIN 0.2 mg/mL in dextrose 5 % infusion  6 mcg/min Intravenous Continuous Leonie Man 1.8 mL/hr at 03/12/11 2148 6 mcg/min at 03/12/11 2148  . pantoprazole (PROTONIX) EC tablet 40 mg  40 mg Oral [address], [place]   40 mg at 03/15/11 1347  . polysaccharide iron (NIFEREX) capsule 150 mg  150 mg Oral Daily Isaiah Serge, NP   150 mg at 03/15/11 1305  . potassium chloride SA (K-DUR,KLOR-CON) CR tablet 20 mEq  20 mEq Oral Daily Isaiah Serge, NP   20 mEq at 03/15/11 0916  . simvastatin (ZOCOR) tablet 20 mg   20 mg Oral Daily Doreene Burke [place], [place]   20 mg at 03/14/11 2220  . zolpidem (AMBIEN) tablet 5 mg  5 mg Oral QHS PRN Erlene Quan, PA      . DISCONTD: insulin glargine (LANTUS) injection 34 Units  34 Units Subcutaneous QHS Mihai Croitoru   34 Units at 03/14/11 2215  . DISCONTD: insulin glargine (LANTUS) injection 36 Units  36 Units Subcutaneous Daily Mihai Croitoru   36 Units at 03/15/11 0913    Lab Results:   Basename 03/15/11 0345 03/14/11 0554 03/13/11 0615  WBC 10.2 10.8* 10.3  HGB 11.0* 11.0* 11.0*  HCT 32.9* 33.4* 34.6*  PLT 210 208 221   stable  BMET  Basename 03/15/11 0345 03/14/11 0554 03/13/11 1117  NA 137 140 138  K 4.0 3.8 3.9  CL 96 95* 93*  CO2 34* 35* 36*  GLUCOSE 268* 246* 310*  BUN 38* 40* 34*  CREATININE 1.42* 1.41* 1.20*  CALCIUM 9.1 9.0 9.5  creatinine seems to be plateauing  PT/INR No results found for this basename: LABPROT:3,INR:3 in the last 72 hours  Studies/Results: Left Heart Cath, 03/12/11  Hemodynamics:  Central Aortic Pressure / Mean Aortic Pressure: 141/57 mmHg; 90 mmHg  LV Pressure / LV End diastolic Pressure: 123456 mmHg, 40 mmHg  Left Ventriculography: Not performed to conserve contrast as the patient has had recent acute renal insufficiency.  Coronary Angiographic Data: Severe diffuse disease  1. Severe multivessel coronary disease: Sequential 75% followed by 2 sequential 70% lesions in the mid LAD confirmed by IVUS (see pictures in the chart), proximal mid circumflex 95% short tubular followed by tubular 70% in the long OM branch, diffusely diseased RCA with at least 50-60% lesions throughout the proximal and mid segment worsening to 70 to 80% prior to the bifurcation into the posterior descending artery that appears to be occluded with to-and-fro flow from LAD septal collaterals and a severely stenotic right posterior atrioventricular groove vessel.  2. Best option for this patient with diabetes and multivessel disease is coronary artery bypass  grafting.  3. Culprit lesion is most likely the circumflex 95% stenosis versus the right PDA.  4. Preserved LV EF by echocardiogram with diastolic heart failure.  5. Subtherapeutic P2Y12 platelet inhibition level.  6. Diabetes, prior CVA with carotid stenting the  Assessment/Plan  Principal Problem:  *Non-ST elevated myocardial infarction (non-STEMI) - inferior lateral ST depression, peak troponin 16 Active Problems:  Diastolic CHF, acute  Diabetes mellitus  Respiratory failure with hypoxia - secondary to acute diastolic heart failure due to non-STEMI  Fever - documented  101  Dyslipidemia  Chronic renal insufficiency, stage III (moderate)  Community acquired pneumonia  HTN (hypertension), benign  PVD (peripheral vascular disease)  Obesity (BMI 30-39.9)  H/O: CVA (cardiovascular accident) - s/p R carotid stent  CVA (cerebral infarction)  UTI (lower urinary tract infection)  Plan:     Severe multivessel coronary artery  -- add to discussed case with Dr. Cyndia Bent from [facility]. I also reviewed his very detailed note. Very much appreciate his input. I didn't tell long talk with the patient's family, they seem to be in agreement with Dr. Cyndia Bent that she may very well partially be a good candidate for bypass surgery. This concern for how well she will recover from the operation. The fact that the patient is actually very adamant about not going for bypass, but isn't amenable to PCI options.   As for PCI options the 90% circumflex lesion, is the likely culprit for this episode, it is PCI amenable. Would likely need to extend the stent proximally to the more proximal 50% lesion. Would then need to potentially consider the PCI of the obtuse marginal branch lesion as well. The other good option for PCI is the proximal most LAD lesion which could be treated with a very short stent. I will discuss the procedure with Dr. Claiborne Billings who is interventional cardiologist on for [place] tomorrow. I did discuss  these these plans with the patient's family and they seem to be agreement with this plan. I think the best plan would be to ensure that her renal function is stable tomorrow, followup urinalysis to assure no ongoing UTI. If these are stable her best option would be to go to the lab tomorrow for multivessel PCI by Dr. Claiborne Billings. As she is diabetic and her P2Y12 assay demonstrated that she is a clopidogrel nonresponder, my recommendation would be to use Brilinta (Ticagrelor).  She continues diuresis well with oral Lasix. Will hold the dose this evening and use gentle IV hydration overnight.  Chronic renal insufficiency (stage III): Creatinine seems to be stable post catheterization. Recheck in the morning. Minimize contrast use.  Diabetes is difficult to manage. Will switch her to her home dose of Levemir, as well as basal insulin with meals in addition to this I scale insulin --  Glucose level consistently high.  UTI on admission: We'll check UA. Keep Foley in until post cath sheath pull/bed rest. Would then try to get the Foley catheter out.  BP and HR stable and controlled. Afebrile. Borderline WBC.  Ordering PT evaluation and treat.    Incentive spirometer.    Plan for multivessel PCI of circumflex/OM and proximal LAD lesion tomorrow with Dr. Claiborne Billings. She will need an interpreter for Cath Lab.  DISPO:  Provided she goes for PCI tomorrow, would want to monitor for at least one additional day post cath to ensure renal function stable. I will happily see her in followup private she is discharged over the weekend, otherwise I will see her Monday morning. The hlicamitations to her   Cardiac catheterization/PCI consent:  Performing MD:  Shelva Majestic, M.D.  Percutaneous Coronary Intervention of Left Circumflex/OM, and LAD  The procedure with Risks/Benefits/Alternatives and Indications was reviewed with the patient  via her daughter as interpreter. Son and husband also present .  All questions were  answered.    Risks / Complications include, but not limited to: Death, MI, CVA/TIA, VF/VT (with defibrillation), Bradycardia (need for temporary pacer placement), contrast induced nephropat Increased risk due to recent cardiac catheterization along with underlying renal insufficiency -- , bleeding / bruising / hematoma / pseudoaneurysm, vascular or coronary injury (with possible emergent CT or Vascular Surgery), adverse medication reactions, infection.    The patient (and family) voice understanding and agree to proceed.  Leonie Man, M.D., M.S. [facility] [address] [place], [place]  [address]  [phone]  03/15/2011 3:55 PM      Kameran Mcneese W 03/15/2011 3:32 PM

## 2011-03-15 NOTE — Consult Note (Signed)
WOC consult Note Reason for Consult: Consult requested for left breast and buttocks. Wound type: Left breast skin fold with fissure and red rash consistent with candidiasis. .1X9X.1cm Small fissure under right breast .1X.1X.1cm  Buttocks with 3 partial thickness wounds.  The locations and appearance is not consistent with pressure ulcers.  Pt has dark scarred areas to buttocks from previous "skin condition" according to family member at bedside.  Unknown etiology, family member states they started as blisters which ruptured.  Pt unable to communicate related to language barrier.  1X1X.1cm, inner gluteal fold .3X.3X.1cm, .5X1X.1cm All sites pink and dry without odor or drainage. Dressing procedure/placement/frequency: Foam dressing to protect areas and promote healing.  Antifungal powder to left breast. Will not plan to follow further unless re-consulted.  [address], [place], MSN, [place]

## 2011-03-15 NOTE — Progress Notes (Signed)
*  PRELIMINARY RESULTS*  Pre CABG Extremity Dopplers have been performed. Palmar Arch Evaluation- Right radial waveform remains normal with radial compression and decreases >50% with ulnar compression. Left waveform remains normal with radial and ulnar compression. Right ABI = 0.98 and Left ABI = 1.1.  Danielle [name] 03/15/2011, 11:08 AM

## 2011-03-15 NOTE — Progress Notes (Signed)
ANTICOAGULATION CONSULT NOTE - Follow Up Consult  Pharmacy Consult for heparin Indication: chest pain/ACS and awaiting CABG  No Known Allergies  Patient Measurements: Height: 5\' 1"  (154.9 cm) Weight: 184 lb 4.9 oz (83.6 kg) IBW/kg (Calculated) : 47.8   Vital Signs: Temp: 98.2 F (36.8 C) (12/06 0810) Temp src: Oral (12/06 0810) BP: 134/51 mmHg (12/06 0810) Pulse Rate: 72  (12/06 0810)  Labs:  Basename 03/15/11 0345 03/14/11 0554 03/13/11 1117 03/13/11 0615  HGB 11.0* 11.0* -- --  HCT 32.9* 33.4* -- 34.6*  PLT 210 208 -- 221  APTT -- -- -- --  LABPROT -- -- -- --  INR -- -- -- --  HEPARINUNFRC 0.56 0.41 -- 0.34  CREATININE 1.42* 1.41* 1.20* --  CKTOTAL -- -- -- --  CKMB -- -- -- --  TROPONINI -- -- -- --   Estimated Creatinine Clearance: 34.1 ml/min (by C-G formula based on Cr of 1.42).   Medications:  Infusions:    . heparin 6.5 mL/hr (03/15/11 0606)  . nitroGLYCERIN 6 mcg/min (03/12/11 2148)    Assessment: 39 yof s/p cardiac cath awaiting possible CABG on IV heparin. Heparin level is therapeutic at 0.56 but has been trending up. No bleeding noted, stable CBC.  Goal of Therapy:  Heparin level 0.3-0.7 units/ml   Plan:  Continue heparin at 650units/hr F/u AM heparin level  Chea Malan, Rande Lawman 03/15/2011,11:26 AM

## 2011-03-15 NOTE — Progress Notes (Signed)
Inpatient Diabetes Program Recommendations  AACE/ADA: New Consensus Statement on Inpatient Glycemic Control (2009)  Target Ranges:  Prepandial:   less than 140 mg/dL      Peak postprandial:   less than 180 mg/dL (1-2 hours)      Critically ill patients:  140 - 180 mg/dL   Reason for Visit: CBGs on 03/14/11:  [phone] mg/dl       CBGs on 03/15/11:  333-339 mg/dl  Inpatient Diabetes Program Recommendations Insulin - Basal: XXX Correction (SSI): XXX Insulin - Meal Coverage: Add Novolog 8 to 10 units meal coverage  TID (takes meal coverage at home)   Note:

## 2011-03-16 ENCOUNTER — Encounter (HOSPITAL_COMMUNITY): Payer: Medicare Other

## 2011-03-16 ENCOUNTER — Encounter (HOSPITAL_COMMUNITY)
Admission: EM | Disposition: A | Discharge status: Home Health | Payer: Self-pay | Source: Home / Self Care | Attending: Internal Medicine

## 2011-03-16 ENCOUNTER — Other Ambulatory Visit: Payer: Self-pay

## 2011-03-16 DIAGNOSIS — I2510 Atherosclerotic heart disease of native coronary artery without angina pectoris: Secondary | ICD-10-CM | POA: Diagnosis present

## 2011-03-16 HISTORY — PX: CORONARY ANGIOPLASTY WITH STENT PLACEMENT: SHX49

## 2011-03-16 HISTORY — PX: PERCUTANEOUS CORONARY STENT INTERVENTION (PCI-S): SHX5485

## 2011-03-16 LAB — BASIC METABOLIC PANEL
CO2: 30 mEq/L (ref 19–32)
Chloride: 99 mEq/L (ref 96–112)
GFR calc Af Amer: 47 mL/min — ABNORMAL LOW (ref >90)
Potassium: 4.1 mEq/L (ref 3.5–5.1)

## 2011-03-16 LAB — GLUCOSE, CAPILLARY
Glucose-Capillary: 261 mg/dL — ABNORMAL HIGH (ref 70–99)
Glucose-Capillary: 193 mg/dL — ABNORMAL HIGH (ref 70–99)
Glucose-Capillary: 204 mg/dL — ABNORMAL HIGH (ref 70–99)

## 2011-03-16 LAB — CBC
HCT: 32.7 % — ABNORMAL LOW (ref 36.0–46.0)
Platelets: 200 10*3/uL (ref 150–400)
RBC: 3.61 MIL/uL — ABNORMAL LOW (ref 3.87–5.11)
RDW: 12.6 % (ref 11.5–15.5)
WBC: 10.8 10*3/uL — ABNORMAL HIGH (ref 4.0–10.5)

## 2011-03-16 LAB — HEPARIN LEVEL (UNFRACTIONATED): Heparin Unfractionated: 0.42 IU/mL (ref 0.30–0.70)

## 2011-03-16 SURGERY — PERCUTANEOUS CORONARY STENT INTERVENTION (PCI-S)
Anesthesia: LOCAL

## 2011-03-16 MED ORDER — FENTANYL CITRATE 0.05 MG/ML IJ SOLN
INTRAMUSCULAR | Status: AC
  Filled 2011-03-16: qty 2

## 2011-03-16 MED ORDER — MORPHINE SULFATE 4 MG/ML IJ SOLN
INTRAMUSCULAR | Status: AC
  Filled 2011-03-16: qty 1

## 2011-03-16 MED ORDER — NITROGLYCERIN IN D5W 200-5 MCG/ML-% IV SOLN
INTRAVENOUS | Status: AC
  Filled 2011-03-16: qty 250

## 2011-03-16 MED ORDER — BIVALIRUDIN 250 MG IV SOLR
INTRAVENOUS | Status: AC
  Filled 2011-03-16: qty 250

## 2011-03-16 MED ORDER — HEPARIN (PORCINE) IN NACL 2-0.9 UNIT/ML-% IJ SOLN
INTRAMUSCULAR | Status: AC
  Filled 2011-03-16: qty 2000

## 2011-03-16 MED ORDER — MIDAZOLAM HCL 2 MG/2ML IJ SOLN
INTRAMUSCULAR | Status: AC
  Filled 2011-03-16: qty 2

## 2011-03-16 MED ORDER — NITROGLYCERIN IN D5W 200-5 MCG/ML-% IV SOLN: 2.0000 ug/min | INTRAVENOUS | Status: DC

## 2011-03-16 MED ORDER — ONDANSETRON HCL 4 MG/2ML IJ SOLN: 4.0000 mg | Freq: Four times a day (QID) | INTRAMUSCULAR | Status: DC | PRN

## 2011-03-16 MED ORDER — ASPIRIN EC 81 MG PO TBEC
81.0000 mg | DELAYED_RELEASE_TABLET | Freq: Every day | ORAL | Status: DC
  Administered 2011-03-17: 81 mg via ORAL
  Filled 2011-03-16 (×2): qty 1

## 2011-03-16 MED ORDER — ONDANSETRON HCL 4 MG/2ML IJ SOLN
INTRAMUSCULAR | Status: AC
  Filled 2011-03-16: qty 2

## 2011-03-16 MED ORDER — NITROGLYCERIN 0.2 MG/ML ON CALL CATH LAB
INTRAVENOUS | Status: AC
  Filled 2011-03-16: qty 1

## 2011-03-16 MED ORDER — SODIUM CHLORIDE 0.9 % IV SOLN
INTRAVENOUS | Status: DC
  Administered 2011-03-17: 04:00:00 via INTRAVENOUS

## 2011-03-16 MED ORDER — LIDOCAINE HCL (PF) 1 % IJ SOLN
INTRAMUSCULAR | Status: AC
  Filled 2011-03-16: qty 30

## 2011-03-16 MED ORDER — TICAGRELOR 90 MG PO TABS
90.0000 mg | ORAL_TABLET | Freq: Two times a day (BID) | ORAL | Status: DC
  Administered 2011-03-16: 90 mg via ORAL
  Filled 2011-03-16 (×3): qty 1

## 2011-03-16 NOTE — H&P (View-Only) (Signed)
ANTICOAGULATION CONSULT NOTE - Follow Up Consult  Pharmacy Consult for heparin Indication: chest pain/ACS  No Known Allergies  Patient Measurements: Height: 5\' 1"  (154.9 cm) Weight: 195 lb 12.3 oz (88.8 kg) IBW/kg (Calculated) : 47.8  Adjusted Body Weight:   Vital Signs: Temp: 97.9 F (36.6 C) (11/30 0824) Temp src: Oral (11/30 0824) BP: 111/53 mmHg (11/30 0824) Pulse Rate: 84  (11/30 0824)  Labs:  Basename 03/09/11 0418 03/08/11 1117 03/08/11 0432 03/07/11 1848 03/07/11 0542 03/06/11 2015 03/06/11 1037  HGB 9.4* 9.9* -- -- -- -- --  HCT 29.2* 30.5* -- -- 29.5* -- --  PLT 181 165 -- -- 150 -- --  APTT -- -- -- -- -- -- 36  LABPROT -- -- -- -- -- 16.0* 14.4  INR -- -- -- -- -- 1.25 1.10  HEPARINUNFRC 0.48 -- 0.44 -- 0.45 -- --  CREATININE 1.92* -- 1.72* -- 1.69* -- --  CKTOTAL -- -- 380* 425* 505* -- --  CKMB -- -- 9.8* 12.9* 29.3* -- --  TROPONINI -- -- 3.15* 6.86* 15.69* -- --   Estimated Creatinine Clearance: 26.1 ml/min (by C-G formula based on Cr of 1.92).   Medications:  Scheduled:    . amLODipine  5 mg Oral Daily  . antiseptic oral rinse  15 mL Mouth Rinse q12n4p  . aspirin EC  81 mg Oral Daily  . azithromycin  250 mg Oral Daily  . cefTRIAXone (ROCEPHIN)  IV  1 g Intravenous Q24H  . chlorhexidine  15 mL Mouth Rinse BID  . clopidogrel  75 mg Oral Daily  . docusate sodium  100 mg Oral BID  . furosemide  80 mg Intravenous TID  . insulin aspart  0-15 Units Subcutaneous TID WC  . insulin aspart  0-5 Units Subcutaneous QHS  . insulin detemir  15 Units Subcutaneous BID  . lactulose  20 g Oral Daily  . metoprolol tartrate  12.5 mg Oral BID  . olmesartan  10 mg Oral Daily  . pantoprazole (PROTONIX) IV  40 mg Intravenous Q24H  . potassium chloride  20 mEq Oral Daily  . simvastatin  20 mg Oral Daily  . white petrolatum      . DISCONTD: furosemide  80 mg Intravenous BID  . DISCONTD: olmesartan  20 mg Oral Daily   Infusions:    . heparin 1,000 Units/hr  (03/09/11 0700)  . nitroGLYCERIN 10 mcg/min (03/09/11 0700)    Assessment: 76 yo who was admitted to r/o MI. She has been on heparin since admission. Her level remains therapeutic. Awaiting cath when hemodynamically stable.   Goal of Therapy:  Heparin level 0.3-0.7 units/ml   Plan:  infusion rate 1000 units/hr  Onnie Boer [place] 03/09/2011,9:02 AM

## 2011-03-16 NOTE — Interval H&P Note (Signed)
History and Physical Interval Note:  03/16/2011 11:48 AM  Danielle [name]  has presented today for surgery, with the diagnosis of cad  The various methods of treatment have been discussed with the patient and family. After consideration of risks, benefits and other options for treatment, the patient has consented to  Procedure(s): PERCUTANEOUS CORONARY STENT INTERVENTION (PCI-S) as a surgical intervention .  The patients' history has been reviewed, patient examined, no change in status, stable for surgery.  I have reviewed the patients' chart and labs.  Questions were answered to the patient's satisfaction.     KELLY,THOMAS A

## 2011-03-16 NOTE — Op Note (Signed)
2 Vessel PCI:  Proximal and distal LCX; Proximal LAD  Danielle [name], 76 y.o., female  DICTATION # [phone], [id number]  Troy Sine, MD, [facility] 03/16/2011 3:13 PM

## 2011-03-16 NOTE — Progress Notes (Signed)
Subjective:  No chest pain  Objective:  Vital Signs in the last 24 hours: Temp:  [97.6 F (36.4 C)-98.2 F (36.8 C)] 97.8 F (36.6 C) (12/07 0700) Pulse Rate:  [62-79] 64  (12/07 0700) Resp:  [12-21] 18  (12/07 0700) BP: (111-142)/(42-85) 132/52 mmHg (12/07 0700) SpO2:  [91 %-100 %] 100 % (12/07 0700) FiO2 (%):  [2 %] 2 % (12/07 0545)  Intake/Output from previous day:  Intake/Output Summary (Last 24 hours) at 03/16/11 1114 Last data filed at 03/16/11 0700  Gross per 24 hour  Intake 904.83 ml  Output   2000 ml  Net -1095.17 ml    Physical Exam: General appearance: alert, cooperative and no distress Lungs: clear to auscultation bilaterally Heart: regular rate and rhythm No hematoma Rt groin   Rate: 65  Rhythm: normal sinus rhythm  Lab Results:  Basename 03/16/11 0402 03/15/11 0345  WBC 10.8* 10.2  HGB 10.8* 11.0*  PLT 200 210    Basename 03/16/11 0402 03/15/11 0345  NA 138 137  K 4.1 4.0  CL 99 96  CO2 30 34*  GLUCOSE 261* 268*  BUN 34* 38*  CREATININE 1.26* 1.42*   No results found for this basename: TROPONINI:2,CK,MB:2 in the last 72 hours Hepatic Function Panel No results found for this basename: PROT,ALBUMIN,AST,ALT,ALKPHOS,BILITOT,BILIDIR,IBILI in the last 72 hours No results found for this basename: CHOL in the last 72 hours No results found for this basename: INR in the last 72 hours  Imaging: No results found.  Cardiac Studies:  Assessment/Plan:   Principal Problem:  *Non-ST elevated myocardial infarction (non-STEMI) - inferior lateral ST depression, peak troponin 16 Active Problems:  Diastolic CHF, acute  Respiratory failure with hypoxia - secondary to acute diastolic heart failure due to non-STEMI  CAD (coronary artery disease)  Diabetes mellitus  HTN (hypertension), benign  Chronic renal insufficiency, stage III (moderate)  Community acquired pneumonia  Dyslipidemia  PVD (peripheral vascular disease)  CVA (cerebral infarction)  Obesity (BMI 30-39.9)  Fever - documented  101  UTI (lower urinary tract infection)  H/O: CVA (cardiovascular accident) - s/p R carotid stent   Plan- PCI today   Kerin Ransom PA-C 03/16/2011, 11:14 AM       Patient seen and examined. Agree with assessment and plan.  Cines reviewed. Discussed with Dr. Ellyn Hack; plan PCI to prox and distal LCX and prox LAD.  Discussed at lenth with pt and family members.  Interpreter present.   Troy Sine, MD, [facility] 03/16/2011 11:45 AM

## 2011-03-16 NOTE — Progress Notes (Signed)
ANTICOAGULATION CONSULT NOTE - Follow Up Consult  Pharmacy Consult for heparin Indication: chest pain/ACS  No Known Allergies  Patient Measurements: Height: 5\' 1"  (154.9 cm) Weight: 184 lb 4.9 oz (83.6 kg) IBW/kg (Calculated) : 47.8   Vital Signs: Temp: 97.8 F (36.6 C) (12/07 0700) Temp src: Oral (12/07 0700) BP: 132/52 mmHg (12/07 0700) Pulse Rate: 64  (12/07 0700)  Labs:  Basename 03/16/11 0402 03/15/11 0345 03/14/11 0554  HGB 10.8* 11.0* --  HCT 32.7* 32.9* 33.4*  PLT 200 210 208  APTT -- -- --  LABPROT -- -- --  INR -- -- --  HEPARINUNFRC 0.42 0.56 0.41  CREATININE 1.26* 1.42* 1.41*  CKTOTAL -- -- --  CKMB -- -- --  TROPONINI -- -- --   Estimated Creatinine Clearance: 38.4 ml/min (by C-G formula based on Cr of 1.26).   Medications:  Infusions:    . sodium chloride 1 mL/kg/hr (03/16/11 0805)  . heparin 650 Units/hr (03/16/11 0700)  . nitroGLYCERIN 6 mcg/min (03/12/11 2148)    Assessment: 44 yof s/p cardiac cath to return to cath lab today for PCI on IV heparin. Heparin level is therapeutic at 0.42. No bleeding noted.   Goal of Therapy:  Heparin level 0.3-0.7 units/ml   Plan:  Continue heparin at 650units/hr F/u post-PCI  Lakasha Mcfall, Rande Lawman 03/16/2011,10:45 AM

## 2011-03-16 NOTE — Progress Notes (Signed)
PT Cancellation Note     Treatment cancelled today due to pt gone for a PCI and then will be on bed rest for 6 hours+---x

## 2011-03-17 ENCOUNTER — Other Ambulatory Visit: Payer: Self-pay

## 2011-03-17 ENCOUNTER — Encounter (HOSPITAL_COMMUNITY): Payer: Self-pay | Admitting: Cardiology

## 2011-03-17 LAB — BASIC METABOLIC PANEL
Chloride: 100 mEq/L (ref 96–112)
Creatinine, Ser: 1.28 mg/dL — ABNORMAL HIGH (ref 0.50–1.10)
GFR calc Af Amer: 47 mL/min — ABNORMAL LOW (ref >90)
Potassium: 4.4 mEq/L (ref 3.5–5.1)

## 2011-03-17 LAB — CBC
MCV: 90.8 fL (ref 78.0–100.0)
Platelets: 177 10*3/uL (ref 150–400)
RDW: 12.8 % (ref 11.5–15.5)
WBC: 11.1 10*3/uL — ABNORMAL HIGH (ref 4.0–10.5)

## 2011-03-17 LAB — GLUCOSE, CAPILLARY

## 2011-03-17 MED ORDER — ISOSORBIDE MONONITRATE ER 60 MG PO TB24: 60.0000 mg | ORAL_TABLET | Freq: Every day | ORAL | Status: DC

## 2011-03-17 MED ORDER — FUROSEMIDE 40 MG PO TABS: 40.0000 mg | ORAL_TABLET | Freq: Every day | ORAL | Status: DC | PRN

## 2011-03-17 MED ORDER — PANTOPRAZOLE SODIUM 40 MG PO TBEC: 40.0000 mg | DELAYED_RELEASE_TABLET | Freq: Every day | ORAL | Status: DC

## 2011-03-17 MED ORDER — POLYSACCHARIDE IRON 150 MG PO CAPS: 150.0000 mg | ORAL_CAPSULE | Freq: Every day | ORAL | Status: DC

## 2011-03-17 MED ORDER — METOPROLOL TARTRATE 12.5 MG HALF TABLET: 12.5000 mg | ORAL_TABLET | Freq: Two times a day (BID) | ORAL | Status: DC

## 2011-03-17 MED ORDER — METFORMIN HCL 500 MG PO TABS: 500.0000 mg | ORAL_TABLET | Freq: Two times a day (BID) | ORAL | Status: DC

## 2011-03-17 MED ORDER — ISOSORBIDE MONONITRATE ER 60 MG PO TB24
60.0000 mg | ORAL_TABLET | Freq: Every day | ORAL | Status: DC
  Administered 2011-03-17: 60 mg via ORAL
  Filled 2011-03-17: qty 1

## 2011-03-17 MED ORDER — NITROGLYCERIN 0.4 MG/SPRAY TL SOLN: 1.0000 | Status: DC | PRN

## 2011-03-17 MED ORDER — TICAGRELOR 90 MG PO TABS
90.0000 mg | ORAL_TABLET | Freq: Two times a day (BID) | ORAL | Status: DC
Start: 2011-03-17 — End: 2011-03-17
  Administered 2011-03-17: 90 mg via ORAL
  Filled 2011-03-17 (×2): qty 1

## 2011-03-17 MED ORDER — TICAGRELOR 90 MG PO TABS: 90.0000 mg | ORAL_TABLET | Freq: Two times a day (BID) | ORAL | Status: DC

## 2011-03-17 MED ORDER — ACETAMINOPHEN 325 MG PO TABS: 650.0000 mg | ORAL_TABLET | ORAL | Status: AC | PRN

## 2011-03-17 MED ORDER — POTASSIUM CHLORIDE CRYS ER 20 MEQ PO TBCR: 20.0000 meq | EXTENDED_RELEASE_TABLET | Freq: Every day | ORAL | Status: DC

## 2011-03-17 MED ORDER — ASPIRIN 81 MG PO CHEW: 81.0000 mg | CHEWABLE_TABLET | Freq: Every day | ORAL | Status: AC

## 2011-03-17 NOTE — Progress Notes (Signed)
CARE MANAGEMENT NOTE 03/17/2011  Patient:  Danielle [name], Danielle [name]   Account Number:  [id number]  Date Initiated:  03/13/2011  Documentation initiated by:  [facility]  Subjective/Objective Assessment:   Non STEMI, CHF, CAD     Action/Plan:   lives at home with husband   Anticipated DC Date:  03/17/2011   Anticipated DC Plan:  [place]  CM consult      [facility] Choice  HOME HEALTH   Choice offered to / List presented to:  C-4 Adult Children        [place] arranged  HH-2 PT      [place]   Status of service:  Completed, signed off Medicare Important Message given?   (If response is "NO", the following Medicare IM given date fields will be blank) Date Medicare IM given:   Date Additional Medicare IM given:    Discharge Disposition:  [place]  Per UR Regulation:  Reviewed for med. necessity/level of care/duration of stay  Comments:  03/17/2011 1100 Spoke to [employer], [place]. Pt does not speak [place]. Pt nodded to verify ok to speak with children. Gave her Brillinta free 30 day supply card. Pt has Medicare so copay advantage will not be utilized. Instructed family to review side effects for medication and have pharmacist review all side effects when they pick up prescription. Explained med has black box warning. Contacted [facility] for [facility]. Explained Arville Go will call family to schedule appts. States best contact is [place], dtr- [phone] and Maudie Mercury, [phone]. Faxed orders to [place]. Spoke to [employer] PA for F2F needing completion for this pt. Jonnie Finner RN CCM Case Mgmt phone [phone]  03-13-11 2:30pm Luz Lex, [place] [phone] UR Completed.

## 2011-03-17 NOTE — Progress Notes (Signed)
Pt. Seen and examined. Agree with the NP/PA-C note as written, except:  She is doing well. No complaint of groin pain, no obvious hematoma or ecchymosis. HCT is down slightly.  Would check again this week. She has some crackles in her right lung base. Will check ambulatory O2 sat. May benefit from a dose of lasix prior to discharge. Feel that she will be okay at home today. Follow-up with Dr. Ellyn Hack in 1-2 weeks.  Danielle Casino, MD Attending Cardiologist [facility] [place]

## 2011-03-17 NOTE — Progress Notes (Signed)
CARDIAC REHAB PHASE I   PRE:  Rate/Rhythm: 72 SR    BP: sitting 136/53    SaO2: 100 2L, 95 RA  MODE:  Ambulation: 340 ft   POST:  Rate/Rhythm: 97 SR    BP: sitting 108/81     SaO2: 93 RA  Pt fairly steady with RW. D/C'd O2 and tolerated well. Only minimal SOB after walk. Tolerated walk well. BP down after walk. Family present and signed waiver to translate. Ed completed (including CHF, MI, stent, ex). Requests referral be sent to [place]. Suggest HHRN and HHPT. Has RW per family. Has scales. [id number]  Darrick Meigs CES, ACSM

## 2011-03-17 NOTE — Progress Notes (Signed)
Subjective:  Up in chair  Objective:  Vital Signs in the last 24 hours: Temp:  [97.4 F (36.3 C)-98.5 F (36.9 C)] 98.5 F (36.9 C) (12/08 0745) Pulse Rate:  [67-79] 71  (12/08 0745) Resp:  [13-18] 18  (12/08 0745) BP: (118-134)/(41-58) 123/56 mmHg (12/08 0745) SpO2:  [95 %-99 %] 95 % (12/08 0745) Weight:  [85.8 kg (189 lb 2.5 oz)] 189 lb 2.5 oz (85.8 kg) (12/08 0229)  Intake/Output from previous day:  Intake/Output Summary (Last 24 hours) at 03/17/11 0854 Last data filed at 03/17/11 0615  Gross per 24 hour  Intake    240 ml  Output   1100 ml  Net   -860 ml    Physical Exam: General appearance: alert, cooperative and no distress Lungs: clear to auscultation bilaterally Heart: regular rate and rhythm, S1, S2 normal, no murmur, click, rub or gallop Rt groin without hematoma   Rate: 77  Rhythm: normal sinus rhythm  Lab Results:  Basename 03/17/11 0615 03/16/11 0402  WBC 11.1* 10.8*  HGB 9.5* 10.8*  PLT 177 200    Basename 03/17/11 0615 03/16/11 0402  NA 138 138  K 4.4 4.1  CL 100 99  CO2 28 30  GLUCOSE 208* 261*  BUN 27* 34*  CREATININE 1.28* 1.26*   No results found for this basename: TROPONINI:2,CK,MB:2 in the last 72 hours Hepatic Function Panel No results found for this basename: PROT,ALBUMIN,AST,ALT,ALKPHOS,BILITOT,BILIDIR,IBILI in the last 72 hours No results found for this basename: CHOL in the last 72 hours No results found for this basename: INR in the last 72 hours  Imaging: No results found.  Cardiac Studies:  Assessment/Plan:   Principal Problem:  *Non-ST elevated myocardial infarction (non-STEMI) -2 site CFX and LAD PCI 12/07  Active Problems:  Diastolic CHF, acute  Respiratory failure with hypoxia - secondary to acute diastolic heart failure due to non-STEMI  CAD (coronary artery disease), CFX (x2) and LAD PCI this adm  Diabetes mellitus  HTN (hypertension), benign  Chronic renal insufficiency, stage III (moderate)  Community  acquired pneumonia  Dyslipidemia  PVD (peripheral vascular disease)  CVA (cerebral infarction)  Obesity (BMI 30-39.9)  Fever - documented  101  UTI (lower urinary tract infection)  H/O: CVA (cardiovascular accident) - s/p R carotid stent   PLan- D/C today, f/u Dr Annye English [facility] PA-C 03/17/2011, 8:54 AM

## 2011-03-17 NOTE — Cardiovascular Report (Signed)
NAMETRISTA, GADSDEN NO.:  [id number]  MEDICAL RECORD NO.:  [id number]  LOCATION:  2503                         FACILITY:  [facility]  PHYSICIAN:  Shelva Majestic, M.D.     DATE OF BIRTH:  February 18, 1935  DATE OF PROCEDURE:  03/16/2011 DATE OF DISCHARGE:                           CARDIAC CATHETERIZATION   PROCEDURE:  Two-vessel percutaneous coronary intervention involving the left circumflex, proximal and distally and the proximal left anterior descending artery.  INDICATIONS:  [name] is a 76 year old [name] who had undergone diagnostic cardiac catheterization several days ago by Dr. Ellyn Hack.  Please refer to his report.  Currently, the patient did undergo surgical consultation for multivessel CAD.  Ultimately, it was decided again surgery and the patient now is referred by Dr. Ellyn Hack for 2-vessel intervention involving the proximal and distal left circumflex coronary artery and proximal LAD.  DESCRIPTION OF PROCEDURE:  After premedication with Versed 1 mg plus fentanyl 25 mcg, the patient was prepped and draped in usual fashion. Her left femoral artery was punctured anteriorly and a 6-French sheath was inserted without difficulty.  A 6-French XB3.5 guide was used for the procedure.  Angiomax bolus plus infusion was administered.  The patient was started on IV nitroglycerin.  The patient had been loaded with Brilinta yesterday and received 90 mg of Brilinta in the laboratory today.  ACT was documented to be therapeutic.  Attention was initially directed to the left circumflex coronary artery.  Scout angiography confirmed segmental 70 followed by 99% eccentric proximal stenoses as well as a 90% tubular distal circumflex stenosis.  A 2.5 x 12 mm Emerge balloon was initially inserted over a Prowater wire after ACT was documented to be therapeutic.  Predilatation was done to the distal circumflex initially.  This balloon was then pulled back to the  proximal circumflex and predilatation was done with this balloon at both sites. A 2.5 x 14 mm DES Resolute stent was then inserted and this was positioned to cover the distal 90% stenosis.  This was dilated x2 up to 12 atmospheres.  It was felt that a 26-mm stent would be necessary to cover both lesions proximally.  A 3.0 x 26 mm DES Resolute stent was then inserted.  This was dilated x2 up to 14 atmospheres.  A 3.25 x 12 mm balloon, noncompliant Trek was used for post-stent dilatation of both lesions.  The distal lesion was dilated up to 2.9 mm.  The proximal stent was post-dilated up to 3.3 mm.  Scout angiography confirmed an excellent angiographic result with brisk TIMI-3 flow without evidence for dissection.  Attention was then directed at the left anterior descending artery. Previously, at the diagnostic catheterization performed by Dr. Ellyn Hack, Dr. Ellyn Hack had performed intravenous ultrasound to this area which confirmed at least 75% stenosis and significant lesion.  The same Prowater wire was used to be advanced down the LAD.  The distal tip of the wire was used to help size the lesion length.  Primary stenting was done to her LAD and then noted to be 50-75% diffuse proximal LAD stenoses with 3.0 x 22 mm DES Resolute stent.  This was post dilated  with the same 3.25 x 12 mm noncompliant Trek balloon and the entire stent was dilated up to 3.3 mm.  As noted in the diagnostic catheterization, it was diffuse, mid distal narrowings, and plan has been to treat these medically.  There was no change in these lesions. Arterial sheath was sutured in place with plans for sheath removal later today.  The patient tolerated the procedure well and returned to her room in 2500.  HEMODYNAMIC DATA:  Initial central aortic pressure was elevated at 180/75.  During the procedure, the patient received several doses of intracoronary nitroglycerin and was started on intravenous nitroglycerin titrated  to 30 mcg.  ANGIOGRAPHIC DATA:  Left main coronary artery was angiographically normal and bifurcated into an LAD and left circumflex system.  The LAD had diffuse 50 up to 75% proximal stenosis before the takeoff of the first diagonal vessel.  There was then an additional 30% narrowing, 50% mid stenosis and 70% distal stenosis in the LAD system.  Following primary PCI stenting to the LAD proximally with insertion of a 3.0 x 22 mm DES stent, post dilated to 3.3 mm, the entire proximal LAD was reduced to 0%.  There was brisk TIMI-3 flow.  There was no evidence for dissection.  There was no change in the distal anatomy.  The circumflex vessel had segmental 70% followed by 99% eccentric stenosis proximally.  The proximal 99% stenosis seemed to be the culprit site of the patient's MI with ulcerated plaque.  There was tubular 90% distal stenosis.  Following successful percutaneous intervention, the proximal 70 and 99% stenoses were  reduced to 0% with insertion of a 3.0 x 26 mm DES Resolute stent, post dilated to 3.3 mm and a distal 90% stenosis was reduced to 0% with the initial 2.5 x 14 mm DES Resolute stent, post dilated to 2.9 mm.  IMPRESSION:  Successful 2-vessel percutaneous coronary intervention involving the proximal and distal left circumflex coronary artery, as well as the proximal left anterior descending artery with ultimate insertion of a 3.0 x 22 mm drug-eluting stent in the proximal left anterior descending coronary artery with 50-75% stenosis being reduced to 0%, post dilated to 3.3 mm, and the segmental 70 and 99% proximal circumflex stenosis being reduced to 0% with the 3.0 x 26 mm drug- eluting stent, postdilated to 3.3 mm and a 90% distal circumflex stenosis being reduced to 0% with insertion of the 2.5 x 14 mm drug- eluting stent Resolute stent, post dilated to 2.9 mm.  Medical therapy will be recommended for the patient's concomitant left anterior descending coronary  artery disease.  The patient also has significant diffuse right coronary artery stenoses which initially were not felt to be amenable to intervention.          ______________________________ Shelva Majestic, M.D.     TK/MEDQ  D:  03/16/2011  T:  03/17/2011  Job:  [id number]

## 2011-03-19 NOTE — Discharge Summary (Signed)
Patient ID: Danielle [name],  MRN: [id number], DOB/AGE: 11-24-1934 76 y.o.  Admit date: 03/06/2011 Discharge date: 03/19/2011  Primary Care Provider: Dr Minna Antis Primary Cardiologist: Dr Ellyn Hack  Discharge Diagnoses Principal Problem:  *Non-ST elevated myocardial infarction (non-STEMI) - inferior lateral ST depression, peak troponin 16, treated with CFX and LAD PCI  Active Problems:  Diastolic CHF, acute  Respiratory failure with hypoxia - secondary to acute diastolic heart failure due to non-STEMI  CAD (coronary artery disease), CFX (x2) and LAD PCI this adm  Diabetes mellitus  HTN (hypertension), benign  Chronic renal insufficiency, stage III (moderate)  Community acquired pneumonia  Dyslipidemia  PVD (peripheral vascular disease)  CVA (cerebral infarction)  Obesity (BMI 30-39.9)  Fever - documented  101  UTI (lower urinary tract infection)  H/O: CVA (cardiovascular accident) - s/p R carotid stent    Procedures: Cardiac Cath 03/12/11. PCI 03/14/11  History of Present Illness: 76 y/o [name] with no prior history of CAD or CHF, sent from Primary Care MD office with increasing SOB. On arrival to ED she was in respiratory distress with O2 sats in the 50s. She was treated with IV Lasix and BiPap with improvement. Her family says she has been suffering from URI for past few days. Last night symptoms progressed to orthopnea. There is no history from family of chest pain. The patient speaks no [place]. Her daughter Herry is acting at family liaison and interpreter.    Hospital Course: The skin is a 76 year old female who was admitted to [facility] from her primary care doctor's office with acute respiratory distress. She was treated with BiPAP and IV diuretics. She ruled in for non-ST elevation MI with a troponin of over 16. She had superimposed community-acquired pneumonia and renal insuficency as well. She was treated with antibiotics and diuretics. Echocardiogram revealed  preserved LV function. When stable enough she was transferred to [facility] [facility] in anticipation of catheterization. This was done 03/12/2011. She had a 75% LAD lesion and the circumflex had a 90% OM1 lesion. The RCA was diffusely diseased with some distal collaterals. The patient was seen in consult by Dr. Pamalee Leyden 03/14/2011. After long discussion with the patient and family it was decided to proceed with elective PCI to the LAD and OM. Dr. Cyndia Bent felt with her multiple comorbidities she would have a good chance of a prolonged rehabilitation after surgery. On 03/16/2011 she underwent circumflex intervention to two sites, and proximal LAD intervention by Dr. Claiborne Billings. She tolerated this well and Dr. Debara Pickett felt she could be discharged on 03/18/2011.  Discharge Vitals:  Blood pressure 123/56, pulse 71, temperature 98.5 F (36.9 C), temperature source Oral, resp. rate 18, height 5\' 1"  (1.549 m), weight 85.8 kg (189 lb 2.5 oz), SpO2 95.00%.    Labs: No results found for this or any previous visit (from the past 48 hour(s)).  Disposition:  Follow-up Information    Follow up with HARDING,DAVID W. (office will call)    Contact information:   [facility] [address], [place] [phone]       Follow up with [facility]. (Home Health Physical Therapy as needed)    Contact information:   [phone]         Discharge Medications:  Discharge Medication List as of 03/17/2011 11:12 AM    START taking these medications   Details  acetaminophen (TYLENOL) 325 MG tablet Take 2 tablets (650 mg total) by mouth every 4 (four)  hours as needed., Starting 03/17/2011, Until Tue 03/27/11, No Print    aspirin 81 MG chewable tablet Chew 1 tablet (81 mg total) by mouth daily., Starting 03/17/2011, Until Sun 03/16/12, No Print    isosorbide mononitrate (IMDUR) 60 MG 24 hr tablet Take 1 tablet (60 mg total) by mouth daily., Starting  03/17/2011, Until Sun 03/16/12, Print    metoprolol tartrate (LOPRESSOR) 12.5 mg TABS Take 0.5 tablets (12.5 mg total) by mouth 2 (two) times daily., Starting 03/17/2011, Until Discontinued, Print    nitroGLYCERIN (NITROLINGUAL) 0.4 MG/SPRAY spray Place 1 spray under the tongue every 5 (five) minutes as needed for chest pain., Starting 03/17/2011, Until Sun 03/16/12, Print    pantoprazole (PROTONIX) 40 MG tablet Take 1 tablet (40 mg total) by mouth daily at 12 noon., Starting 03/17/2011, Until Sun 03/16/12, Print    polysaccharide iron (NIFEREX) 150 MG CAPS capsule Take 1 capsule (150 mg total) by mouth daily., Starting 03/17/2011, Until Discontinued, Print    potassium chloride SA (K-DUR,KLOR-CON) 20 MEQ tablet Take 1 tablet (20 mEq total) by mouth daily., Starting 03/17/2011, Until Sun 03/16/12, Print      CONTINUE these medications which have CHANGED   Details  metFORMIN (GLUCOPHAGE) 500 MG tablet Take 1 tablet (500 mg total) by mouth 2 (two) times daily with a meal., Starting 03/19/2011, Until Discontinued, No Print    Ticagrelor (BRILINTA) 90 MG TABS tablet Take 1 tablet (90 mg total) by mouth 2 (two) times daily., Starting 03/17/2011, Until Discontinued, Print      CONTINUE these medications which have NOT CHANGED   Details  amLODipine (NORVASC) 5 MG tablet Take 5 mg by mouth daily.  , Until Discontinued, Historical Med    atorvastatin (LIPITOR) 40 MG tablet Take 40 mg by mouth daily.  , Until Discontinued, Historical Med    insulin aspart (NOVOLOG FLEXPEN) 100 UNIT/ML injection Inject 16 Units into the skin 3 (three) times daily.  , Until Discontinued, Historical Med    insulin detemir (LEVEMIR) 100 UNIT/ML injection Inject into the skin 2 (two) times daily. 36 units in the morning and 34 units in the evening , Until Discontinued, Historical Med    clopidogrel (PLAVIX) 75 MG tablet Take 75 mg by mouth daily.  , Until Discontinued, Historical Med    fluticasone (FLONASE) 50 MCG/ACT nasal  spray Place 1 spray into the nose 2 (two) times daily as needed.  , Until Discontinued, Historical Med    furosemide (LASIX) 40 MG tablet Take 40 mg by mouth daily as needed. For fluid retention , Until Discontinued, Historical Med      STOP taking these medications     olmesartan (BENICAR) 40 MG tablet      Saxagliptin-Metformin (KOMBIGLYZE XR) 5-500 MG TB24      cefPROZIL (CEFZIL) 500 MG tablet         Outstanding Labs/Studies  Duration of Discharge Encounter: Greater than 30 minutes including physician time.  Angelena Form PA-C 03/19/2011 3:56 PM

## 2011-03-20 MED FILL — Dextrose Inj 5%: INTRAVENOUS | Qty: 50 | Status: AC

## 2011-12-27 ENCOUNTER — Ambulatory Visit
Admission: RE | Admit: 2011-12-27 | Discharge: 2011-12-27 | Disposition: A | Discharge status: Home / Self Care | Payer: Medicare Other | Source: Ambulatory Visit | Attending: Family Medicine | Admitting: Family Medicine

## 2011-12-27 ENCOUNTER — Other Ambulatory Visit: Payer: Self-pay | Admitting: Family Medicine

## 2011-12-27 DIAGNOSIS — R059 Cough, unspecified: Secondary | ICD-10-CM

## 2011-12-27 DIAGNOSIS — R05 Cough: Secondary | ICD-10-CM

## 2012-09-29 ENCOUNTER — Other Ambulatory Visit: Payer: Self-pay | Admitting: Cardiology

## 2012-09-30 NOTE — Telephone Encounter (Signed)
Rx was sent to pharmacy electronically. 

## 2012-10-27 ENCOUNTER — Other Ambulatory Visit: Payer: Self-pay | Admitting: Cardiology

## 2012-11-09 ENCOUNTER — Encounter: Payer: Self-pay | Admitting: *Deleted

## 2012-11-10 ENCOUNTER — Encounter: Payer: Self-pay | Admitting: Cardiology

## 2012-11-10 ENCOUNTER — Ambulatory Visit (INDEPENDENT_AMBULATORY_CARE_PROVIDER_SITE_OTHER): Payer: Medicare Other | Admitting: Cardiology

## 2012-11-10 VITALS — BP 128/78 | HR 71 | Ht 60.0 in | Wt 187.3 lb

## 2012-11-10 DIAGNOSIS — R9431 Abnormal electrocardiogram [ECG] [EKG]: Secondary | ICD-10-CM

## 2012-11-10 DIAGNOSIS — R002 Palpitations: Secondary | ICD-10-CM

## 2012-11-10 DIAGNOSIS — E785 Hyperlipidemia, unspecified: Secondary | ICD-10-CM

## 2012-11-10 DIAGNOSIS — E669 Obesity, unspecified: Secondary | ICD-10-CM

## 2012-11-10 DIAGNOSIS — I739 Peripheral vascular disease, unspecified: Secondary | ICD-10-CM

## 2012-11-10 DIAGNOSIS — I639 Cerebral infarction, unspecified: Secondary | ICD-10-CM

## 2012-11-10 DIAGNOSIS — I6350 Cerebral infarction due to unspecified occlusion or stenosis of unspecified cerebral artery: Secondary | ICD-10-CM

## 2012-11-10 DIAGNOSIS — I5031 Acute diastolic (congestive) heart failure: Secondary | ICD-10-CM

## 2012-11-10 DIAGNOSIS — I10 Essential (primary) hypertension: Secondary | ICD-10-CM

## 2012-11-10 DIAGNOSIS — I509 Heart failure, unspecified: Secondary | ICD-10-CM

## 2012-11-10 DIAGNOSIS — I2510 Atherosclerotic heart disease of native coronary artery without angina pectoris: Secondary | ICD-10-CM

## 2012-11-10 DIAGNOSIS — Z8673 Personal history of transient ischemic attack (TIA), and cerebral infarction without residual deficits: Secondary | ICD-10-CM

## 2012-11-10 NOTE — Patient Instructions (Addendum)
Everything seems to be stable.  Please have Dr Chalmers Cater check LIPIDS ,cmp AT NEXT LAB DRAW  Your physician wants you to follow-up in 3  Month Dr Ellyn Hack.  You will receive a reminder letter in the mail two months in advance. If you don't receive a letter, please call our office to schedule the follow-up appointment.  Leonie Man, MD

## 2012-11-17 ENCOUNTER — Other Ambulatory Visit: Payer: Self-pay | Admitting: Cardiology

## 2012-11-17 NOTE — Telephone Encounter (Signed)
Rx was sent to pharmacy electronically. 

## 2012-11-29 ENCOUNTER — Encounter: Payer: Self-pay | Admitting: Cardiology

## 2012-11-29 DIAGNOSIS — R9431 Abnormal electrocardiogram [ECG] [EKG]: Secondary | ICD-10-CM | POA: Insufficient documentation

## 2012-11-29 NOTE — Progress Notes (Signed)
Patient ID: Danielle [name], female   DOB: [birth year], 78 y.o.   MRN: [id number] PCP: Antony Blackbird, MD  Clinic Note: Chief Complaint  Patient presents with  . Follow-up    Coronary disease, hypertension, dyslipidemia and diastolic heart failure chronic.   HPI: Danielle [name] is a 78 y.o. female with an extensive cardiac and cardiovascular history described below who presents today for six-month followup. I last saw her back in February.  She seemed to be doing relatively well.  Her lipid control was pretty good with total cholesterol 176, LDL 86, HDL 49 triglycerides 205.  We talked about sliding scale Lasix, but her daughter is helping her with. She follows with Dr. Chalmers Cater for her endocrinology/diabetes.  Interval History: She comes in today really pretty much the same as she LAD is.  She is always smiling and happy.  She seemed to answer the questions her daughter asks her, but I have to rely on translation.  She again continues to be sedentary barely doing any kind of activity.  She does do a few short walks here and there, is really limited due to knee pain mostly in her left knee.  She doesn't describe any symptoms of shortness of breath with rest or with her mild amount of exertion.  No PND orthopnea, and really minimal edema.  She denies any palpitations or rapid heart beats.  No syncope or near-syncope type symptoms no recurrent TIA or amaurosis fugax symptoms.  She does have some constipation, no melena, hematochezia or hematuria.  Her mother notes that she does try to do much walking i.e. walking down the street with her husband, she would get short of breath.  Past Medical History  Diagnosis Date  . History of Non-ST elevated myocardial infarction (non-STEMI) 03/06/2011    Presentation was acute diastolic heart failure with edema and pulmonary edema.  Symptom was dyspnea but not specifically anginal chest pressure.  Marland Kitchen CAD S/P percutaneous coronary angioplasty November 2012    Severe 3  Vessel, s/p PCI with Resolute DES to LAD x1, LCx/OM x2, diffise distal LAD & entire RCA disease - not PCI amenable  . Chronic diastolic heart failure, NYHA class 2-3     Grade 1 Diastolic Dysfunction by echo  . History of stroke with residual effects Unsure    Old left-sided CVA --> S/P Left Carotid STENT in [place];   . History of stroke without residual deficits August 2011    Right Internal Capsule Stroke, nonhemorrhagic  . Hyperlipidemia   . Obesity (BMI 30-39.9)   . Diabetes mellitus, type II, insulin dependent      Difficult to control; in obese, with CAD and PAD complications   . Hypertension   . CKD (chronic kidney disease), stage III   . Osteoarthritis   . History of seasonal allergies    Prior Cardiac Evaluation and Past Surgical History: Past Surgical History  Procedure Laterality Date  . Cholecystectomy    . Coronary angioplasty with stent placement  03/16/11    LAD x1 (3.0 mm 20 mm (postdilated - 3.3 mm) Resolute DES, LCx/OM1 x2; distal 2.5 mm x 14 mm and proximal 3.0 mm at 26 mm Resolute DES; post-dilation 2.9 distal, 3.25 proximal  . Carotid stent Left     Performed in [place]  . Doppler echocardiography  03/07/2011    EF 55-60%; moderate concentric LVH, mild posterolateral hypokinesis.  Grade 1 Diastolic Dysfunction with high filling pressures.   Allergies  Allergen Reactions  . Lisinopril Cough  .  Losartan Cough    Current Outpatient Prescriptions  Medication Sig Dispense Refill  . amitriptyline (ELAVIL) 10 MG tablet Take 10 mg by mouth at bedtime.      Marland Kitchen amLODipine (NORVASC) 5 MG tablet Take 5 mg by mouth daily.        Marland Kitchen aspirin EC 81 MG tablet Take 81 mg by mouth daily.      Marland Kitchen atorvastatin (LIPITOR) 40 MG tablet Take 40 mg by mouth daily.        Marland Kitchen BRILINTA 90 MG TABS tablet take 1 tablet by mouth twice a day  60 tablet  8  . fenofibrate 54 MG tablet Take 54 mg by mouth daily.      Marland Kitchen FERREX 150 150 MG capsule take 1 capsule by mouth once daily  30 capsule  6  .  furosemide (LASIX) 40 MG tablet Take 1 tablet (40 mg total) by mouth daily as needed. For fluid retention  30 tablet  5  . insulin NPH (HUMULIN N,NOVOLIN N) 100 UNIT/ML injection Inject into the skin. 50 units in morning,60 units in evening      . insulin regular (NOVOLIN R,HUMULIN R) 100 units/mL injection Inject 25 Units into the skin 3 (three) times daily before meals.      . isosorbide mononitrate (IMDUR) 60 MG 24 hr tablet take 1 tablet by mouth once daily  30 tablet  6  . metoprolol tartrate (LOPRESSOR) 25 MG tablet Take 25 mg by mouth 2 (two) times daily.      Marland Kitchen ACCU-CHEK COMPACT PLUS test strip       . pantoprazole (PROTONIX) 40 MG tablet take 1 tablet by mouth once daily  30 tablet  11   No current facility-administered medications for this visit.   History   Social History Narrative   She is a married, mother of 47 with 6 grandchildren.  She does not smoke or drink.  She is essentially sedentary, does not do much at all besides around the house.  She does range of motion exercises for a few minutes in the morning, but does not get routine walking or aerobic exercise.  She speaks no [place], but her daughter is essentially her primary care provider both in the house as well as for clinic visits.   ROS: A comprehensive Review of Systems - Negative except Bilateral left greater than right knee arthritis pain, constipation, dyspnea on exertion.  PHYSICAL EXAM BP 128/78  Pulse 71  Ht 5' (1.524 m)  Wt 187 lb 4.8 oz (84.959 kg)  BMI 36.58 kg/m2 General: she is a pleasant, rotund woman. She is very smiley, always smiling, happy go lucky, does not understand any English, just is always giving thumbs up and smiling. Has trouble getting up and down off the exam table to 2 her height and girth. Likes to walk out of the clinic much faster than she comes in.  She is always well groomed,well taken care of.  HEENT: NCAT. EOMI. MMM. Anicteric sclerae.  Neck: Supple with no LAN. Unable to see any JVD  and no carotid bruit.   Heart: Distant heart sounds; RRR. Normal S1, S2. No M/R/G. Nondisplaced PMI.  Lungs: Diminished breath sounds throughout basically due to body habitus but otherwise CTAB. Nonlabored. Normal effort. Good air movement.  Abdomen: Obese but soft/NT/ND/NABS.  Cannot palpate a distended secondary to obesity.  Extremities: Trace lower extremity edema with 1+ pulses bilaterally.   [id number] today: Yes Rate: 71, Normal Sinus Rhythm;  new subtle ST  depressions with downsloping T wave inversions in leads V4 and V5  Recent Labs: None available  ASSESSMENT / PLAN: Symptomatically stable coronary disease and diastolic heart failure, but new ECG changes.  She would like to establish a primary care provider, and we have given some names for that.  Abnormal resting ECG findings In this patient, who does not do much in the way of activities to really test result for anginal symptoms, and he did not have classic anginal symptoms to begin with, I am concerned with changes on ECG that would suggest possible lateral ischemia which is in the distribution of her circumflex stents.  I discussed these findings with her daughter, who just insisted that her mother is relatively stable and would prefer not to have additional testing if needed.  We decided then to have her follow back up a little bit sooner than originally planned, in order to recheck an ECG and to reevaluate her symptoms.  However low threshold to check A LexiScan Nuclear Stress Test based on her known anatomy and the lack of classic symptoms.    CAD (coronary artery disease), CFX (x2) and LAD PCI in setting of non-STEMI and acute diastolic heart failure As I noted before, with her extensive stent work and RCA disease, I plan to continue her on dual antiplatelet therapy.  We could switch her to Plavix if they become financially necessary, but they seem happy with Brilinta.  She is on a PPI for GI prophylaxis.  She is on  amlodipine and low-dose beta blocker.  We tried ARB and ACE inhibitor both of which gave her cough and did not work as well on blood pressure.  She is also on statin.  Diastolic CHF, acute Air-fluid level really seems stable.  She usually does not go for weeks without taking a couple tablets.  It is really just using them as needed.  I recommended that she takes at least 3-4 days a week regardless.  Again we discussed monitoring daily weights and adjusting the doses accordingly.  The daughter is quite sharp and has been in really good job with this.  Obesity (BMI 30-39.9) This is a major issue for her.  She is really limited as far as mobility is with her at arthritis.  We talked about dietary modification and the daughter swears that she is being careful to what she eats, doesn't know what her mother and a sneek behind her back.  I did encourage her as always have to at least try to get out and walk a little even though her knees bother her.  With diabetes dyslipidemia, obesity and hypertension she has all the risk factors of Metabolic Syndrome, which does put her at even higher risk.  This is not even considering that her diabetes is difficult control.  PVD (peripheral vascular disease) As far until she is not having any claudication symptoms.  She does have coronary and carotid disease, so she quite likely would have lower extremity disease.  With her being a diabetic, I just reinforced the need to monitor her lites for any sores or wounds.  Her daughter checks them at routine intervals, to ensure that there are no wounds.  HTN (hypertension), benign Her blood pressure nicely stable now and has been for the last couple visits.  She is on low-dose of beta blocker and amlodipine without really significant enough and amlodipine and doing well.  Dyslipidemia She is on a comminution of Lipitor and fenofibrate.  I've asked her get her  labs checked when she sees Dr. Chalmers Cater again for her diabetes  evaluation.  We may need to increase her fenofibrate dose to a full dose.  We really had to get a close control her lipids for the above-mentioned reasons.  Chronic renal insufficiency, stage III (moderate) Of late, her renal processes be stable by report.  I don't have any recent labs however.   She is a very complex, difficult to manage patient with multiple medical problems that requires extensive medical decision-making.  She also has a new finding of ECG changes.  This plus the difficulty with communication creates a clinic visit that is at least 45-60 minutes.  I spent well over one hour between the patient contact and chart review  Orders Placed This Encounter  Procedures  . EKG 12-Lead   Followup: 3-4 months  DAVID W. Ellyn Hack, M.D., M.S. [facility] [address] [place], [place]  [address]  [phone] Pager # [phone]

## 2012-11-29 NOTE — Assessment & Plan Note (Signed)
She is on a comminution of Lipitor and fenofibrate.  I've asked her get her labs checked when she sees Dr. Chalmers Cater again for her diabetes evaluation.  We may need to increase her fenofibrate dose to a full dose.  We really had to get a close control her lipids for the above-mentioned reasons.

## 2012-11-29 NOTE — Assessment & Plan Note (Signed)
As far until she is not having any claudication symptoms.  She does have coronary and carotid disease, so she quite likely would have lower extremity disease.  With her being a diabetic, I just reinforced the need to monitor her lites for any sores or wounds.  Her daughter checks them at routine intervals, to ensure that there are no wounds.

## 2012-11-29 NOTE — Assessment & Plan Note (Signed)
Of late, her renal processes be stable by report.  I don't have any recent labs however.

## 2012-11-29 NOTE — Assessment & Plan Note (Signed)
As I noted before, with her extensive stent work and RCA disease, I plan to continue her on dual antiplatelet therapy.  We could switch her to Plavix if they become financially necessary, but they seem happy with Brilinta.  She is on a PPI for GI prophylaxis.  She is on amlodipine and low-dose beta blocker.  We tried ARB and ACE inhibitor both of which gave her cough and did not work as well on blood pressure.  She is also on statin.

## 2012-11-29 NOTE — Assessment & Plan Note (Signed)
This is a major issue for her.  She is really limited as far as mobility is with her at arthritis.  We talked about dietary modification and the daughter swears that she is being careful to what she eats, doesn't know what her mother and a sneek behind her back.  I did encourage her as always have to at least try to get out and walk a little even though her knees bother her.  With diabetes dyslipidemia, obesity and hypertension she has all the risk factors of Metabolic Syndrome, which does put her at even higher risk.  This is not even considering that her diabetes is difficult control.

## 2012-11-29 NOTE — Assessment & Plan Note (Signed)
Air-fluid level really seems stable.  She usually does not go for weeks without taking a couple tablets.  It is really just using them as needed.  I recommended that she takes at least 3-4 days a week regardless.  Again we discussed monitoring daily weights and adjusting the doses accordingly.  The daughter is quite sharp and has been in really good job with this.

## 2012-11-29 NOTE — Assessment & Plan Note (Addendum)
In this patient, who does not do much in the way of activities to really test result for anginal symptoms, and he did not have classic anginal symptoms to begin with, I am concerned with changes on ECG that would suggest possible lateral ischemia which is in the distribution of her circumflex stents.  I discussed these findings with her daughter, who just insisted that her mother is relatively stable and would prefer not to have additional testing if needed.  We decided then to have her follow back up a little bit sooner than originally planned, in order to recheck an ECG and to reevaluate her symptoms.  However low threshold to check A LexiScan Nuclear Stress Test based on her known anatomy and the lack of classic symptoms.

## 2012-11-29 NOTE — Assessment & Plan Note (Signed)
Her blood pressure nicely stable now and has been for the last couple visits.  She is on low-dose of beta blocker and amlodipine without really significant enough and amlodipine and doing well.

## 2012-12-09 ENCOUNTER — Encounter: Payer: Self-pay | Admitting: Cardiology

## 2012-12-15 ENCOUNTER — Other Ambulatory Visit: Payer: Self-pay | Admitting: Cardiology

## 2012-12-15 NOTE — Telephone Encounter (Signed)
Rx was sent to pharmacy electronically. 

## 2012-12-17 ENCOUNTER — Telehealth: Payer: Self-pay | Admitting: *Deleted

## 2012-12-17 NOTE — Telephone Encounter (Signed)
Informed daughter that labwork was reviewed no changes per Dr Ellyn Hack.  labs were done 11/17/12

## 2012-12-29 ENCOUNTER — Other Ambulatory Visit: Payer: Self-pay | Admitting: Cardiology

## 2012-12-29 NOTE — Telephone Encounter (Signed)
Rx was sent to pharmacy electronically. 

## 2013-01-20 ENCOUNTER — Other Ambulatory Visit: Payer: Self-pay | Admitting: *Deleted

## 2013-01-20 MED ORDER — FUROSEMIDE 40 MG PO TABS: 40.0000 mg | ORAL_TABLET | Freq: Two times a day (BID) | ORAL | Status: DC

## 2013-03-26 ENCOUNTER — Ambulatory Visit: Payer: Medicare Other | Admitting: Cardiology

## 2013-04-16 ENCOUNTER — Ambulatory Visit (INDEPENDENT_AMBULATORY_CARE_PROVIDER_SITE_OTHER): Payer: Medicare Other | Admitting: Cardiology

## 2013-04-16 ENCOUNTER — Encounter: Payer: Self-pay | Admitting: Cardiology

## 2013-04-16 VITALS — BP 142/72 | HR 67 | Ht 59.0 in | Wt 191.2 lb

## 2013-04-16 DIAGNOSIS — E669 Obesity, unspecified: Secondary | ICD-10-CM

## 2013-04-16 DIAGNOSIS — R9431 Abnormal electrocardiogram [ECG] [EKG]: Secondary | ICD-10-CM

## 2013-04-16 DIAGNOSIS — I739 Peripheral vascular disease, unspecified: Secondary | ICD-10-CM

## 2013-04-16 DIAGNOSIS — I2510 Atherosclerotic heart disease of native coronary artery without angina pectoris: Secondary | ICD-10-CM

## 2013-04-16 DIAGNOSIS — I10 Essential (primary) hypertension: Secondary | ICD-10-CM

## 2013-04-16 DIAGNOSIS — E785 Hyperlipidemia, unspecified: Secondary | ICD-10-CM

## 2013-04-16 DIAGNOSIS — I214 Non-ST elevation (NSTEMI) myocardial infarction: Secondary | ICD-10-CM

## 2013-04-16 DIAGNOSIS — I5032 Chronic diastolic (congestive) heart failure: Secondary | ICD-10-CM

## 2013-04-16 NOTE — Assessment & Plan Note (Signed)
Relatively stable especially in light of her existing RCA disease. Because of that I think we need to continue her on dual antiplatelet therapy. There are happy continuing Brilinta. There've been no bleeding complications. She is on PPI. She is on a good cardiac regimen of beta blocker, statin and nitrate. Her amlodipine was discontinued by the nephrologist and she was placed on twice a day ARB.

## 2013-04-16 NOTE — Progress Notes (Signed)
Patient ID: [name], female   DOB: 03/01/1935, 79 y.o.   MRN: [id number] PCP: Antony Blackbird, MD  Clinic Note: Chief Complaint  Patient presents with  . 5 month visit    no chest pain , sob with walking, sometime edema in legs  weight at home 187 lbs   HPI: Danielle [name] is a 79 y.o. female with an extensive cardiac and cardiovascular history described below who presents today for six-month followup. I last saw her back in August and she was doing relatively well.   She follows with Dr. Chalmers Cater for her endocrinology/diabetes.  Interval History: She comes in today really pretty much the same as she usually is. She has no major complaints. The biggest thing that her daughter notes that she is not doing anything as far as any activity. She does not exercise and she doesn't get short of breath. She has noticed this her mother has been over eating, and is not motivated to do any activity -- mostly noting difficulty with ambulation due to knee pain. She is currently not using a rolling walker. She is due for having labs checked at her primary endocrinologist (Dr. Chalmers Cater in February).  She is always smiling and happy.  She seemed to answer the questions her daughter asks her, but I have to rely on translation.    She doesn't describe any symptoms of shortness of breath with rest or with her mild amount of exertion - unless doing her "exercises ".  No PND or orthopnea.  She does have intermittent mild edema, and her daughter is trying to adjust the Lasix doses accordingly.  She denies any palpitations or rapid heart beats.  No syncope or near-syncope type symptoms no recurrent TIA or amaurosis fugax symptoms.  She does have some constipation, no melena, hematochezia or hematuria.     Past Medical History  Diagnosis Date  . History of Non-ST elevated myocardial infarction (non-STEMI) 03/06/2011    Presentation was acute diastolic heart failure with edema and pulmonary edema.  Symptom was dyspnea but not  specifically anginal chest pressure.  Marland Kitchen CAD S/P percutaneous coronary angioplasty November 2012    Severe 3 Vessel, s/p PCI with Resolute DES to LAD x1, LCx/OM x2, diffise distal LAD & entire RCA disease - not PCI amenable  . Chronic diastolic heart failure, NYHA class 2-3     Grade 1 Diastolic Dysfunction by echo  . History of stroke with residual effects Unsure    Old left-sided CVA --> S/P Left Carotid STENT in [place];   . History of stroke without residual deficits August 2011    Right Internal Capsule Stroke, nonhemorrhagic  . Hyperlipidemia   . Obesity (BMI 30-39.9)   . Diabetes mellitus, type II, insulin dependent      Difficult to control; in obese, with CAD and PAD complications   . Hypertension   . CKD (chronic kidney disease), stage III   . Osteoarthritis   . History of seasonal allergies    Prior Cardiac Evaluation and Past Surgical History: Past Surgical History  Procedure Laterality Date  . Cholecystectomy    . Coronary angioplasty with stent placement  03/16/11    LAD x1 (3.0 mm 20 mm (postdilated - 3.3 mm) Resolute DES, LCx/OM1 x2; distal 2.5 mm x 14 mm and proximal 3.0 mm at 26 mm Resolute DES; post-dilation 2.9 distal, 3.25 proximal  . Carotid stent Left     Performed in [place]  . Doppler echocardiography  03/07/2011  EF 55-60%; moderate concentric LVH, mild posterolateral hypokinesis.  Grade 1 Diastolic Dysfunction with high filling pressures.   Allergies  Allergen Reactions  . Lisinopril Cough  . Losartan Cough    Current Outpatient Prescriptions  Medication Sig Dispense Refill  . ACCU-CHEK COMPACT PLUS test strip       . amitriptyline (ELAVIL) 10 MG tablet Take 10 mg by mouth at bedtime.      Marland Kitchen aspirin EC 81 MG tablet Take 81 mg by mouth daily.      Marland Kitchen atorvastatin (LIPITOR) 40 MG tablet Take 40 mg by mouth daily.        Marland Kitchen BRILINTA 90 MG TABS tablet take 1 tablet by mouth twice a day  60 tablet  8  . fenofibrate 54 MG tablet take 1 tablet by mouth daily   30 tablet  11  . FERREX 150 150 MG capsule take 1 capsule by mouth once daily  30 capsule  6  . furosemide (LASIX) 40 MG tablet Take 1 tablet (40 mg total) by mouth 2 (two) times daily.  60 tablet  11  . insulin NPH (HUMULIN N,NOVOLIN N) 100 UNIT/ML injection Inject into the skin. 60 units in morning,60 units in evening      . insulin regular (NOVOLIN R,HUMULIN R) 100 units/mL injection Inject 30 Units into the skin 3 (three) times daily before meals.       . isosorbide mononitrate (IMDUR) 60 MG 24 hr tablet take 1 tablet by mouth once daily  30 tablet  6  . losartan (COZAAR) 50 MG tablet Take 50 mg by mouth 2 (two) times daily.      . metoprolol tartrate (LOPRESSOR) 25 MG tablet Take 25 mg by mouth 2 (two) times daily.      . pantoprazole (PROTONIX) 40 MG tablet take 1 tablet by mouth once daily  30 tablet  11   No current facility-administered medications for this visit.  Off of Amlodipine -- Losartan now BID.   History   Social History Narrative   She is a married, mother of 41 with 6 grandchildren.  She does not smoke or drink.  She is essentially sedentary, does not do much at all besides around the house.  She does range of motion exercises for a few minutes in the morning, but does not get routine walking or aerobic exercise.  She speaks no [place], but her daughter is essentially her primary care provider both in the house as well as for clinic visits.   ROS: A comprehensive Review of Systems - Negative except Bilateral left greater than right knee arthritis pain, constipation, dyspnea on exertion.  PHYSICAL EXAM BP 142/72  Pulse 67  Ht 4\' 11"  (1.499 m)  Wt 191 lb 3.2 oz (86.728 kg)  BMI 38.60 kg/m2 General: she is a pleasant, rotund woman. She is very smiley, always smiling, happy go lucky, does not understand any English, just is always giving thumbs up and smiling. Has trouble getting up and down off the exam table to 2 her height and girth. Likes to walk out of the clinic much  faster than she comes in.  She is always well groomed,well taken care of.  HEENT: NCAT. EOMI. MMM. Anicteric sclerae.  Neck: Supple with no LAN. Unable to see any JVD and no carotid bruit.   Heart: Distant heart sounds; RRR. Normal S1, S2. No M/R/G. Nondisplaced PMI.  Lungs: Diminished breath sounds throughout basically due to body habitus but otherwise CTAB. Nonlabored. Normal effort. Good  air movement.  Abdomen: Obese but soft/NT/ND/NABS.  Cannot palpate a distended secondary to obesity.  Extremities: Trace lower extremity edema with 1+ pulses bilaterally.   [id number] today: Yes Rate:  57, Normal Sinus Rhythm; nonspecific ST-T changes. The downsloping T waves in the inferior leads are not present at this time.  Recent Labs: None available  ASSESSMENT / PLAN: Symptomatically stable coronary disease and diastolic heart failure.  She has yet to establish a primary care provider. No plans for medication changes. Simply discussed sliding scale Lasix.    CAD (coronary artery disease), CFX (x2) and LAD PCI in setting of non-STEMI and acute diastolic heart failure Relatively stable especially in light of her existing RCA disease. Because of that I think we need to continue her on dual antiplatelet therapy. There are happy continuing Brilinta. There've been no bleeding complications. She is on PPI. She is on a good cardiac regimen of beta blocker, statin and nitrate. Her amlodipine was discontinued by the nephrologist and she was placed on twice a day ARB.  History of: Non-ST elevated myocardial infarction (non-STEMI) - inferior lateral ST depression, peak troponin 16 Her presentation at that time was profound dyspnea that was mostly acute diastolic heart failure. She is not having any symptoms to suggest ongoing ischemia. She is preserved EF with no significant damage done by the MI.  Chronic diastolic CHF (congestive heart failure), NYHA class 2 Stable edema on twice a day Lasix. I did give  the daughter again permission to increase her Lasix dose according to potential weight gain and edema. She is up 4 pounds today, but her daughter thinks this is mostly due to lack of activity and eating. A weight gain has been gradual and not over the course of a day or 2.  Again discussed if her Lasix.  HTN (hypertension), benign A little bit higher than I like it to be, but relatively stable on current management. I would defer adjustment of the dose to her nephrologist. I think she is on a stable dose of beta blocker  Obesity (BMI 30-39.9) We seem to be fully a losing battle her. She has limited mobility because of her knees, and limited drive to exercise. Despite this she continues to eat as she so desires. Each time I see her, she simply smiles when her daughter tries explain the need to monitor her dietary intake. She seems to be "grazer "not eating large meals, just simply eating all the time. I tried to reiterate the need to eat reasonable meals on a regular schedule.  Dyslipidemia On statin and fenofibrate.  Monitored by Dr.Balan, her endocrinologist  Abnormal resting ECG findings The lateral depressions are now not has noticed there is only T-wave inversions in aVL, but not lateral precordial leads. Probably related to lead placement and body habitus, and not due to ischemia.   She remains relatively difficult patient to care for simply based on language barrier and having to get most of the history from her daughter.  Orders Placed This Encounter  Procedures  . EKG 12-Lead   Followup: 3-4 months  Danielle [name] W. Ellyn Hack, M.D., M.S. [facility] [address] [place], [place]  [address]  [phone] Pager # [phone]

## 2013-04-16 NOTE — Assessment & Plan Note (Addendum)
Her presentation at that time was profound dyspnea that was mostly acute diastolic heart failure. She is not having any symptoms to suggest ongoing ischemia. She is preserved EF with no significant damage done by the MI.

## 2013-04-16 NOTE — Assessment & Plan Note (Signed)
A little bit higher than I like it to be, but relatively stable on current management. I would defer adjustment of the dose to her nephrologist. I think she is on a stable dose of beta blocker

## 2013-04-16 NOTE — Assessment & Plan Note (Signed)
We seem to be fully a losing battle her. She has limited mobility because of her knees, and limited drive to exercise. Despite this she continues to eat as she so desires. Each time I see her, she simply smiles when her daughter tries explain the need to monitor her dietary intake. She seems to be "grazer "not eating large meals, just simply eating all the time. I tried to reiterate the need to eat reasonable meals on a regular schedule.

## 2013-04-16 NOTE — Patient Instructions (Addendum)
Things seem stable. Continue to watch her weights - may need to increase the fluid pill dose every now & then if the swelling increases or if she is more short of breath.    I am happy with her current medications. No changes or labs.  Leonie Man, MD   Your physician wants you to follow-up in: 6 months 30 min.  You will receive a reminder letter in the mail two months in advance. If you don't receive a letter, please call our office to schedule the follow-up appointment.

## 2013-04-16 NOTE — Assessment & Plan Note (Signed)
On statin and fenofibrate.  Monitored by Rosamaria Lints, her endocrinologist

## 2013-04-16 NOTE — Assessment & Plan Note (Signed)
The lateral depressions are now not has noticed there is only T-wave inversions in aVL, but not lateral precordial leads. Probably related to lead placement and body habitus, and not due to ischemia.

## 2013-04-16 NOTE — Assessment & Plan Note (Signed)
Stable edema on twice a day Lasix. I did give the daughter again permission to increase her Lasix dose according to potential weight gain and edema. She is up 4 pounds today, but her daughter thinks this is mostly due to lack of activity and eating. A weight gain has been gradual and not over the course of a day or 2.  Again discussed if her Lasix.

## 2013-04-19 ENCOUNTER — Other Ambulatory Visit: Payer: Self-pay | Admitting: Cardiology

## 2013-04-20 NOTE — Telephone Encounter (Signed)
Rx was sent to pharmacy electronically. 

## 2013-05-23 ENCOUNTER — Other Ambulatory Visit: Payer: Self-pay | Admitting: Cardiology

## 2013-05-25 NOTE — Telephone Encounter (Signed)
Rx was sent to pharmacy electronically. 

## 2013-06-01 ENCOUNTER — Other Ambulatory Visit: Payer: Self-pay | Admitting: Cardiology

## 2013-06-01 NOTE — Telephone Encounter (Signed)
Family notified to have PCP determine if she needs to be on Ferrex daily.  Voiced understanding and will call her PCP.

## 2013-06-01 NOTE — Telephone Encounter (Signed)
Not my call -- this is usually PCP or Nephrology. Leonie Man, MD

## 2013-06-01 NOTE — Telephone Encounter (Signed)
Will defer to Dr. Ellyn Hack. Does she need Ferrex?

## 2013-06-03 ENCOUNTER — Other Ambulatory Visit: Payer: Self-pay | Admitting: *Deleted

## 2013-06-03 MED ORDER — POLYSACCHARIDE IRON COMPLEX 150 MG PO CAPS: ORAL_CAPSULE | ORAL | Status: DC

## 2013-06-03 NOTE — Telephone Encounter (Signed)
Rx was sent to pharmacy electronically. 

## 2013-06-22 ENCOUNTER — Other Ambulatory Visit: Payer: Self-pay | Admitting: Cardiology

## 2013-06-22 NOTE — Telephone Encounter (Signed)
Rx was sent to pharmacy electronically. 

## 2013-06-27 ENCOUNTER — Other Ambulatory Visit: Payer: Self-pay | Admitting: Cardiology

## 2013-06-29 NOTE — Telephone Encounter (Signed)
Rx was sent to pharmacy electronically. 

## 2013-10-19 ENCOUNTER — Ambulatory Visit: Payer: Medicare Other | Admitting: Cardiology

## 2013-11-12 ENCOUNTER — Other Ambulatory Visit: Payer: Self-pay | Admitting: Cardiology

## 2013-11-12 NOTE — Telephone Encounter (Signed)
Rx was sent to pharmacy electronically. 

## 2013-12-08 HISTORY — PX: NM MYOVIEW LTD: HXRAD82

## 2013-12-10 ENCOUNTER — Other Ambulatory Visit: Payer: Self-pay | Admitting: Cardiology

## 2013-12-16 ENCOUNTER — Encounter: Payer: Self-pay | Admitting: Cardiology

## 2013-12-16 ENCOUNTER — Ambulatory Visit (INDEPENDENT_AMBULATORY_CARE_PROVIDER_SITE_OTHER): Payer: Medicare Other | Admitting: Cardiology

## 2013-12-16 VITALS — BP 102/60 | HR 71 | Ht 59.0 in | Wt 197.1 lb

## 2013-12-16 DIAGNOSIS — Z9861 Coronary angioplasty status: Secondary | ICD-10-CM

## 2013-12-16 DIAGNOSIS — E669 Obesity, unspecified: Secondary | ICD-10-CM

## 2013-12-16 DIAGNOSIS — E785 Hyperlipidemia, unspecified: Secondary | ICD-10-CM

## 2013-12-16 DIAGNOSIS — R0609 Other forms of dyspnea: Secondary | ICD-10-CM

## 2013-12-16 DIAGNOSIS — R0989 Other specified symptoms and signs involving the circulatory and respiratory systems: Secondary | ICD-10-CM

## 2013-12-16 DIAGNOSIS — I5032 Chronic diastolic (congestive) heart failure: Secondary | ICD-10-CM

## 2013-12-16 DIAGNOSIS — I2510 Atherosclerotic heart disease of native coronary artery without angina pectoris: Secondary | ICD-10-CM

## 2013-12-16 DIAGNOSIS — I10 Essential (primary) hypertension: Secondary | ICD-10-CM

## 2013-12-16 NOTE — Progress Notes (Signed)
PCP: Jani Gravel, MD  Clinic Note: Chief Complaint  Patient presents with  . 8 month visit    no chest pain, sob  , edem - daughter increase furosemide to 3 times a day    HPI: Danielle [name] is a 79 y.o. female with a Cardiovascular Problem List below who presents today for delayed six-month followup of CAD status post PCI. She also is chronic diastolic heart failure. I saw her last in January, and she was doing relatively well. Intermittently they had increased her Lasix doses for her swelling but the for the most part she had been stable.  Interval History: She presented today again with her daughter and husband. Her daughter and the interpreter helping to interpret, she only in [place]. They have noticed that over last month there was a precipitous change in her level of exertional dyspnea. Now she will get noticeably short of breath just walking around the rooms of her house. She denies any PND or orthopnea, but she has had more edema. Her weight has actually notably increased 6 pounds since January but 10 pounds since this time last year. She never had any chest tightness or pressure when she had her non-STEMI originally. It was mostly dyspnea. She doesn't essentially no dyspnea just at rest, but with just about any exertion it is significant. Her daughter has just recently started giving her more Lasix, and she may be feeling a bit better after doing that. They're taking 40 twice a day. No rapid or irregular heartbeats, syncope/near syncope, TIA/amaurosis fugax. No melena, hematochezia, hematuria, epistaxis.  No claudication   Past Medical History  Diagnosis Date  . History of Non-ST elevated myocardial infarction (non-STEMI) 03/06/2011    Presentation was acute diastolic heart failure with edema and pulmonary edema.  Symptom was dyspnea but not specifically anginal chest pressure.  Marland Kitchen CAD S/P percutaneous coronary angioplasty November 2012    Severe 3 Vessel, s/p PCI with Resolute DES to  LAD x1, LCx/OM x2, diffise distal LAD & entire RCA disease - not PCI amenable  . Chronic diastolic heart failure, NYHA class 2-3     Grade 1 Diastolic Dysfunction by echo  . History of stroke with residual effects Unsure    Old left-sided CVA --> S/P Left Carotid STENT in [place];   . History of stroke without residual deficits August 2011    Right Internal Capsule Stroke, nonhemorrhagic  . Hyperlipidemia   . Obesity (BMI 30-39.9)   . Diabetes mellitus, type II, insulin dependent      Difficult to control; in obese, with CAD and PAD complications   . Hypertension   . CKD (chronic kidney disease), stage III   . Osteoarthritis   . History of seasonal allergies     Prior Cardiac Evaluation and History: Procedure Laterality Date  . Coronary angioplasty with stent placement  03/16/11    LAD x1 (3.0 mm 20 mm (postdilated - 3.3 mm) Resolute DES, LCx/OM1 x2; distal 2.5 mm x 14 mm and proximal 3.0 mm at 26 mm Resolute DES; post-dilation 2.9 distal, 3.25 proximal  . Carotid stent Left     Performed in [place]  . Doppler echocardiography  03/07/2011    EF 55-60%; moderate concentric LVH, mild posterolateral hypokinesis.  Grade 1 Diastolic Dysfunction with high filling pressures.   MEDICATIONS AND ALLERGIES REVIEWED IN EPIC No Change in Social and Family History  ROS: A comprehensive Review of Systems - was performed Review of Systems  Constitutional: Negative for fever and  chills.       Tired, barely able to do anything  Eyes: Negative for blurred vision.  Respiratory: Positive for shortness of breath.   Cardiovascular: Positive for leg swelling. Negative for chest pain.       Per history of present illness  Gastrointestinal: Negative for heartburn, constipation, blood in stool and melena.  Genitourinary: Negative for hematuria and flank pain.  Musculoskeletal: Positive for joint pain.       Slow wide-based gait at baseline  Endo/Heme/Allergies: Does not bruise/bleed easily.  All other  systems reviewed and are negative.   Wt Readings from Last 3 Encounters:  12/16/13 197 lb 1.6 oz (89.404 kg)  04/16/13 191 lb 3.2 oz (86.728 kg)  11/10/12 187 lb 4.8 oz (84.959 kg)   PHYSICAL EXAM BP 102/60  Pulse 71  Ht 4\' 11"  (1.499 m)  Wt 197 lb 1.6 oz (89.404 kg)  BMI 39.79 kg/m2 General appearance: alert, cooperative, appears stated age, no distress and moderate to severely obese. She is not her usual jovial mood. Less interactive. Seems to be a little bit more short of breath just at baseline. Neck: no adenopathy, no carotid bruit, but unable to assess JVP due to her body habitus. Lungs: Diminished breath sounds bilaterally because of body habitus. No rales or rhonchi heard. normal percussion bilaterally and non-labored Heart: regular rate and rhythm, S1, S2 normal but distant, no murmur, click, rub or gallop; unable to palpate PMI. Abdomen: soft, non-tender; bowel sounds normal; no masses,  no organomegaly; OB Extremities: extremities normal, atraumatic, no cyanosis, and edema 2+ Pulses: 2+ and symmetric; palpable= Neurologic: Mental status: Alert, oriented, thought content appropriate Cranial nerves: normal (II-XII grossly intact)   Adult ECG Report  Rate: 71 ;  Rhythm: normal sinus rhythm and Nonspecific ST and T-wave changes but otherwise normal EKG  Recent Labs not available:   ASSESSMENT / PLAN: DOE (dyspnea on exertion) Acute on chronic worsening of her baseline dyspnea.   I am worried about the sudden change -- ? Could this be ischemic mediated.  Plan:  Increase Lasix dosage x 6 days as noted below  [facility]  & compare results to Cath films (try to avoid cath if possible due to CKD)  Chronic diastolic CHF (congestive heart failure), NYHA class 2 Slightly worse Symptoms of DOE - weight up ~10 lb in 1 yr.  Lasix 80 bid x 2 days, 80/40 x 2 days, 8 AM x 2 days then return to 40 bid. Lexiscan Myoview  CAD (coronary artery disease), CFX (x2) and LAD PCI  in setting of non-STEMI and acute diastolic heart failure ~1 month of what seems like a subacute exacerbation of symptoms.  Has significant existing disease -- extensive RCA &LAD disease.  Need to reassess for potential worsening ischemic effect.    HTN (hypertension), benign Actually borderline hypotensive.  No room to push afterload reduction.  We are left with pre-load reduction - Lasix.  Dyslipidemia, goal LDL below 70 On combination statin plus fenofibrate. Followed by her endocrinologist. If not at goal, would strongly consider PSK-9 Inhibitor as adjunct therapy  Obesity (BMI 30-39.9) Again this continues to be losing battle. She has gained 10 pounds since last year at this time. Certainly some of this fluid build up, however she really is not able to do any exercise and therefore will not burn calories. By looking at her family, I do suspect there he did have her surgery on healthy I'm not sure what else we can do for  weight loss. Again we stressed the importance of limiting her dietary intake.    Orders Placed This Encounter  Procedures  . Myocardial Perfusion Imaging    Standing Status: Future     Number of Occurrences:      Standing Expiration Date: 12/16/2014    Scheduling Instructions:     Next week    Order Specific Question:  Where should this test be performed    Answer:  [facility]    Order Specific Question:  Type of stress    Answer:  Lexiscan    Order Specific Question:  Patient weight in lbs    Answer:  197  . EKG 12-Lead   Meds ordered this encounter  Medications  . furosemide (LASIX) 40 MG tablet    Sig: Take 40 mg by mouth 3 (three) times daily.    Followup: 2-3 weeks  Haroldine Redler W. Ellyn Hack, M.D., M.S. Interventional Cardiologist [facility]

## 2013-12-16 NOTE — Patient Instructions (Addendum)
FUROSEMIDE(LASIX)  take 2 tablets twice a day for 2 days. Then 2 tablet in morning and 1 tablet in the evening for 2 daysthen Take 2 tablets in morning  2 days then return regular dose   Your physician has requested that you have a lexiscan myoview. For further information please visit [email or web address]. Please follow instruction sheet, as given. -NEXT WEEK  Your physician wants you to follow-up in 2 weeks You will receive a reminder letter in the mail two months in advance. If you don't receive a letter, please call our office to schedule the follow-up appointment.

## 2013-12-17 DIAGNOSIS — R0609 Other forms of dyspnea: Principal | ICD-10-CM | POA: Insufficient documentation

## 2013-12-17 NOTE — Assessment & Plan Note (Signed)
Again this continues to be losing battle. She has gained 10 pounds since last year at this time. Certainly some of this fluid build up, however she really is not able to do any exercise and therefore will not burn calories. By looking at her family, I do suspect there he did have her surgery on healthy I'm not sure what else we can do for weight loss. Again we stressed the importance of limiting her dietary intake.

## 2013-12-17 NOTE — Assessment & Plan Note (Signed)
Actually borderline hypotensive.  No room to push afterload reduction.  We are left with pre-load reduction - Lasix.

## 2013-12-17 NOTE — Assessment & Plan Note (Signed)
On combination statin plus fenofibrate. Followed by her endocrinologist. If not at goal, would strongly consider PSK-9 Inhibitor as adjunct therapy

## 2013-12-17 NOTE — Assessment & Plan Note (Signed)
Slightly worse Symptoms of DOE - weight up ~10 lb in 1 yr.  Lasix 80 bid x 2 days, 80/40 x 2 days, 8 AM x 2 days then return to 40 bid. Lexiscan Myoview

## 2013-12-17 NOTE — Assessment & Plan Note (Signed)
Acute on chronic worsening of her baseline dyspnea.   I am worried about the sudden change -- ? Could this be ischemic mediated.  Plan:  Increase Lasix dosage x 6 days as noted below  [facility]  & compare results to Cath films (try to avoid cath if possible due to CKD)

## 2013-12-17 NOTE — Assessment & Plan Note (Signed)
~  1 month of what seems like a subacute exacerbation of symptoms.  Has significant existing disease -- extensive RCA &LAD disease.  Need to reassess for potential worsening ischemic effect.

## 2013-12-18 ENCOUNTER — Telehealth: Payer: Self-pay | Admitting: Cardiology

## 2013-12-21 NOTE — Telephone Encounter (Signed)
Closed encounter °

## 2013-12-24 ENCOUNTER — Telehealth: Payer: Self-pay | Admitting: Cardiology

## 2013-12-24 MED ORDER — FUROSEMIDE 40 MG PO TABS: 40.0000 mg | ORAL_TABLET | Freq: Three times a day (TID) | ORAL | Status: DC

## 2013-12-24 NOTE — Telephone Encounter (Signed)
Please call,pt is still having problems breathing. Should she go back to taking 2 fluid pills in the morning and 2 at lunch for 2 days?

## 2013-12-24 NOTE — Telephone Encounter (Signed)
Per daughter Mrs. Giovannoni experiencing SOB with any activity.  Today's weight is 198 on home scales but unable to tell me what she weighed at home after her last visit.  States minimal swelling.  Wants to know if she should increase Lasix for several days again?  Will send to Dr. Ellyn Hack for review and advise.

## 2013-12-24 NOTE — Telephone Encounter (Signed)
Furosemide refilled electronically.

## 2013-12-24 NOTE — Telephone Encounter (Signed)
Yes.  Her wt seems to be up still.    Leonie Man, MD

## 2013-12-24 NOTE — Telephone Encounter (Signed)
Pt need her Furosemide,please call this to [place]

## 2013-12-25 ENCOUNTER — Telehealth (HOSPITAL_COMMUNITY): Payer: Self-pay

## 2013-12-25 NOTE — Telephone Encounter (Signed)
Encounter complete. 

## 2013-12-25 NOTE — Telephone Encounter (Signed)
Message communicated to [facility]. Voiced understanding that Dr. Ellyn Hack stated is OK to increase diuretic x2 days

## 2013-12-29 ENCOUNTER — Telehealth: Payer: Self-pay | Admitting: Cardiology

## 2013-12-29 NOTE — Telephone Encounter (Signed)
Returned call and spoke with patient's daughter. She reports that her mom is having a serious issue and is having a lot of pain in the bottom of her mouth. They are concerned if she should have her stress test tomorrow. Reports that pain got worse yesterday and she has been taking only tylenol for pain.   They are to consult with an oral surgeon and expect that all lower teeth will need to be pulled.   Informed daughter that likely cardiac clearance will be necessary in order to proceed with oral surgery (daughter is 99% sure this will be the outcome of the expected consultation) and that it would be best to proceed with stress test in anticipation of need for clearance. Patient is not having any cardiac complaints, just oral pain   Daughter voiced understanding and patient will proceed with lexiscan myoview.

## 2013-12-29 NOTE — Telephone Encounter (Signed)
Spoke with daughter KAELIN-- Informed her of Dr Ellyn Hack  COMMENT She states she will contact office if they will cancel myoview- and if teeth to be pulled

## 2013-12-29 NOTE — Telephone Encounter (Signed)
Myoview would help for Pre-op, but if teeth need to come out -- would not wait.  Leonie Man, MD

## 2013-12-29 NOTE — Telephone Encounter (Signed)
Please call, need to talk to you about her condition today. She is having dental problems,not sure she will be able to take stress test test tomorrow. Please call asap.

## 2013-12-30 ENCOUNTER — Ambulatory Visit (HOSPITAL_COMMUNITY)
Admission: RE | Admit: 2013-12-30 | Discharge: 2013-12-30 | Disposition: A | Discharge status: Home / Self Care | Payer: Medicare Other | Source: Ambulatory Visit | Attending: Cardiology | Admitting: Cardiology

## 2013-12-30 DIAGNOSIS — R0989 Other specified symptoms and signs involving the circulatory and respiratory systems: Secondary | ICD-10-CM | POA: Diagnosis not present

## 2013-12-30 DIAGNOSIS — I5032 Chronic diastolic (congestive) heart failure: Secondary | ICD-10-CM | POA: Diagnosis not present

## 2013-12-30 DIAGNOSIS — I10 Essential (primary) hypertension: Secondary | ICD-10-CM | POA: Insufficient documentation

## 2013-12-30 DIAGNOSIS — R9431 Abnormal electrocardiogram [ECG] [EKG]: Secondary | ICD-10-CM | POA: Diagnosis not present

## 2013-12-30 DIAGNOSIS — R0609 Other forms of dyspnea: Secondary | ICD-10-CM | POA: Diagnosis not present

## 2013-12-30 MED ORDER — AMINOPHYLLINE 25 MG/ML IV SOLN
75.0000 mg | Freq: Once | INTRAVENOUS | Status: AC
  Administered 2013-12-30: 75 mg via INTRAVENOUS

## 2013-12-30 MED ORDER — REGADENOSON 0.4 MG/5ML IV SOLN
0.4000 mg | Freq: Once | INTRAVENOUS | Status: AC
  Administered 2013-12-30: 0.4 mg via INTRAVENOUS

## 2013-12-30 MED ORDER — TECHNETIUM TC 99M SESTAMIBI GENERIC - CARDIOLITE
29.7000 | Freq: Once | INTRAVENOUS | Status: AC | PRN
  Administered 2013-12-30: 30 via INTRAVENOUS

## 2013-12-30 MED ORDER — TECHNETIUM TC 99M SESTAMIBI GENERIC - CARDIOLITE
10.8000 | Freq: Once | INTRAVENOUS | Status: AC | PRN
  Administered 2013-12-30: 11 via INTRAVENOUS

## 2013-12-30 NOTE — Procedures (Addendum)
 [facility] [address] [place] [address] [phone]  Cardiology Nuclear Med Study  Danielle [name] is a 79 y.o. female     MRN : [id number]     DOB: [birth year]  Procedure Date: 12/30/2013  Nuclear Med Background Indication for Stress Test:  Abnormal EKG History:  CAD, MI 11/12, Stent placement 12/12 Cardiac Risk Factors: Carotid Disease, CVA, Hypertension, Lipids, Obesity, PVD and Diabetes  Symptoms:  DOE, Fatigue and SOB   Nuclear Pre-Procedure Caffeine/Decaff Intake:  1:00am NPO After: 11:00am   IV Site: L Antecubital  IV 0.9% NS with Angio Cath:  22g  Chest Size (in):  n/a IV Started by: Otho Perl, CNMT  Height: 4\' 11"  (1.499 m)  Cup Size: 40C  BMI:  Body mass index is 39.77 kg/(m^2). Weight:  197 lb (89.359 kg)   Tech Comments:  n/a    Nuclear Med Study 1 or 2 day study: 1 day  Stress Test Type:  [facility] Provider:  Glenetta Hew, MD   Resting Radionuclide: Technetium 36m Sestamibi  Resting Radionuclide Dose: 10.8 mCi   Stress Radionuclide:  Technetium 64m Sestamibi  Stress Radionuclide Dose: 29.7 mCi           Stress Protocol Rest HR:64 Stress HR: 76  Rest BP: 123/59 Stress BP: 138/62  Exercise Time (min): n/a METS: n/a          Dose of Adenosine (mg):  n/a Dose of Lexiscan: 0.4 mg  Dose of Atropine (mg): n/a Dose of Dobutamine: n/a mcg/kg/min (at max HR)  Stress Test Technologist: Mellody Memos, CCT Nuclear Technologist: Imagene Riches, CNMT   Rest Procedure:  Myocardial perfusion imaging was performed at rest 45 minutes following the intravenous administration of Technetium 63m Sestamibi. Stress Procedure:  The patient received IV Lexiscan 0.4 mg over 15-seconds.  Technetium 71m Sestamibi injected at 30-seconds.  Patient experienced shortness of breath and was administered 75 mg of Aminophylline IV at 5 minutes. There were no significant changes with Lexiscan.  Quantitative  spect images were obtained after a 45 minute delay.  Transient Ischemic Dilatation (Normal <1.22):  0.98  QGS EDV:  85 ml QGS ESV:  28 ml LV Ejection Fraction: 67%      Rest ECG: NSR with non-specific ST-T wave changes  Stress ECG: No significant change from baseline ECG  QPS Raw Data Images:  Normal; no motion artifact; normal heart/lung ratio. Stress Images:  There is decreased uptake in the inferior wall. Rest Images:  there is substantial improvement in inferior wall perfusion Subtraction (SDS):  These findings are consistent with ischemia. LV Wall Motion:  NL LV Function; NL Wall Motion  Impression Exercise Capacity:  Lexiscan with no exercise. BP Response:  Normal blood pressure response. Clinical Symptoms:  No significant symptoms noted. ECG Impression:  No significant ECG changes with Lexiscan. Comparison with Prior Nuclear Study: No previous nuclear study performed   Overall Impression:  Intermediate risk stress nuclear study with inferior wall ischemia, consistent with known diffuse right coronary artery disease that was deemed to be non-amenable to percutaneous revascularization.Sanda Klein, MD  12/30/2013 4:57 PM

## 2013-12-31 ENCOUNTER — Telehealth: Payer: Self-pay | Admitting: *Deleted

## 2013-12-31 ENCOUNTER — Other Ambulatory Visit: Payer: Self-pay | Admitting: Cardiology

## 2013-12-31 NOTE — Telephone Encounter (Signed)
Spoke to [employer]. Result given . Verbalized understanding AWARE APPOINTMENT 01/07/14 1:30 PM

## 2013-12-31 NOTE — Telephone Encounter (Signed)
REFILL, E-SENT RX

## 2013-12-31 NOTE — Telephone Encounter (Signed)
Message copied by Raiford Simmonds on Thu Dec 31, 2013  5:57 PM ------      Message from: Leonie Man      Created: Wed Dec 30, 2013  5:48 PM       Abnormal Nuclear ST -- the findings pretty much coincide with the artery blockages that we already knew about.        The stented arteries still look good.        Pump function is normal.      Although read as Intermediate --this would not sway me to proceed with Cardiac Cath.  I cannot exclude that it is related to her symptoms - but from what we know of her anatomy, the RCA (artery in question) was not amenable to PCI.            Leonie Man, MD       ------

## 2014-01-05 NOTE — Telephone Encounter (Signed)
Encounter complete. 

## 2014-01-07 ENCOUNTER — Ambulatory Visit (INDEPENDENT_AMBULATORY_CARE_PROVIDER_SITE_OTHER): Payer: Medicare Other | Admitting: Cardiology

## 2014-01-07 ENCOUNTER — Encounter: Payer: Self-pay | Admitting: Cardiology

## 2014-01-07 VITALS — BP 121/61 | HR 71 | Ht <= 58 in | Wt 199.5 lb

## 2014-01-07 DIAGNOSIS — Z01810 Encounter for preprocedural cardiovascular examination: Secondary | ICD-10-CM

## 2014-01-07 DIAGNOSIS — I63232 Cerebral infarction due to unspecified occlusion or stenosis of left carotid arteries: Secondary | ICD-10-CM

## 2014-01-07 DIAGNOSIS — I10 Essential (primary) hypertension: Secondary | ICD-10-CM

## 2014-01-07 DIAGNOSIS — E119 Type 2 diabetes mellitus without complications: Secondary | ICD-10-CM

## 2014-01-07 DIAGNOSIS — E669 Obesity, unspecified: Secondary | ICD-10-CM

## 2014-01-07 DIAGNOSIS — Z794 Long term (current) use of insulin: Secondary | ICD-10-CM

## 2014-01-07 DIAGNOSIS — Z9861 Coronary angioplasty status: Secondary | ICD-10-CM

## 2014-01-07 DIAGNOSIS — R0609 Other forms of dyspnea: Secondary | ICD-10-CM

## 2014-01-07 DIAGNOSIS — I5032 Chronic diastolic (congestive) heart failure: Secondary | ICD-10-CM

## 2014-01-07 DIAGNOSIS — I2510 Atherosclerotic heart disease of native coronary artery without angina pectoris: Secondary | ICD-10-CM

## 2014-01-07 MED ORDER — ISOSORBIDE MONONITRATE ER 60 MG PO TB24: 90.0000 mg | ORAL_TABLET | Freq: Every day | ORAL | Status: DC

## 2014-01-07 NOTE — Assessment & Plan Note (Addendum)
Improved symptoms with increased dose of Lasix. She still not back to her baseline weight. Her daughter thinks that she is gaining weight because of nighttime grazing food. Is hoping that her PCP will be starting an injection medication for diabetes at will also help with weight loss.  Plan: 3 times per week she will take 80 mg Lasix in the morning and 40 in evening as opposed to 40 twice a day. Otherwise continue with sliding scale recommendations. Her borderline or pressure precludes me increasing her ARB dose for additional afterload reduction. I will increase her Imdur to 90 mg.  rest importance of low salt intake

## 2014-01-07 NOTE — Assessment & Plan Note (Signed)
I really think her daughter's right. The weight is really making it harder for read. The sizer abdominal obesity clearly in peds diaphragmatic excursion. We're fighting a losing now here because the [employer] she gains more dyspneic she gets meaning she does less activity.  A Foley within the diabetic treatment she will start to lose weight help her get more active and therefore lose more weight.. I tried really hard through the interpreter to explain her importance of weight loss and how much it plays in her symptoms.

## 2014-01-07 NOTE — Assessment & Plan Note (Signed)
He is due to have several teeth removed by the dental surgeon. They are awaiting the results of stress test for preoperative evaluation. By definition she does have active heart rate or symptoms, she does have diabetes on insulin, and has had a stroke. Her renal function is abnormal but creatinine is below 2. She would be high risk for any significant surgery, but for simply teeth pole with local anesthesia, she should be fine. I would not do any additional evaluation. She is ordered on a beta blocker. She can stop Brilinta 5 days prior to and restart one to 2 days post procedure.

## 2014-01-07 NOTE — Assessment & Plan Note (Signed)
Unfortunately her blood pressures precluded the ability to increase afterload reduction

## 2014-01-07 NOTE — Progress Notes (Signed)
PCP: Jani Gravel, MD  Clinic Note: Chief Complaint  Patient presents with  . Follow-up    after test; shortness of breath on exertion; no other complaints    HPI: Danielle [name] is a 79 y.o. female with a Cardiovascular Problem List below who presents today for followup of mild acute on chronic diastolic heart failure symptoms with possible worsening anginal symptoms. She is here to followup her Myoview stress test done this year to see the results. I last saw on September 10, she was borderline in extremis with dyspnea and diaphoresis with a notable change from her baseline. She had significant edema. I increased her Lasix with a [place] pulse taper.   She presented today again with her daughter and husband. Her daughter and the interpreter helping to interpret, she only speaks [place].   Her Myoview was done and I was asked to present within reading physician. We discussed the results of which would be considered either intermediate or high risk with inferior inferolateral ischemia. However the distribution correlates well with the known severe disease of the entire RCA would be the worst disease in the PDA and RPL system. With that in mind and the thought was that no invasive evaluation would be warranted.  Interval History:  Her daughter reports that she seems to be doing better with less dyspnea since the higher doses of Lasix. The swelling is definitely improved. Despite this though she still has exertional dyspnea. She never had any chest discomfort and consequently sleeps pretty much at a pretty significant incline with orthopnea and this could also be because of the size of her abdominal girth. She denies any rapid heartbeats or palpitations. No symptoms to suggest PND which would be difficult anyway since she is sitting up. She has not had syncope or near-syncope a TIA/amaurosis fugax symptoms.   Past Medical History  Diagnosis Date  . History of Non-ST elevated myocardial infarction  (non-STEMI) 03/06/2011    Presentation was acute diastolic heart failure with edema and pulmonary edema.  Symptom was dyspnea but not specifically anginal chest pressure.  Marland Kitchen CAD S/P percutaneous coronary angioplasty November 2012    a)Severe 3 Vessel, s/p PCI with Resolute DES to LAD x1, LCx/OM x2, diffise distal LAD & entire RCA disease - not PCI amenable;; b) Myoview 12/2013: EF 67%,OINTERMEDIATE RISK - Inferior Ischemia c/w known anatomy (RCA CAD that is not PCI amenable)     . Chronic diastolic heart failure, NYHA class 2-3     Grade 1 Diastolic Dysfunction by echo  . History of stroke with residual effects Unsure    Old left-sided CVA --> S/P Left Carotid STENT in [place];   . History of stroke without residual deficits August 2011    Right Internal Capsule Stroke, nonhemorrhagic  . Hyperlipidemia   . Obesity (BMI 30-39.9)   . Diabetes mellitus, type II, insulin dependent      Difficult to control; in obese, with CAD and PAD complications   . Hypertension   . CKD (chronic kidney disease), stage III   . Osteoarthritis   . History of seasonal allergies     Past Surgical History  Procedure Laterality Date  . Cholecystectomy    . Coronary angioplasty with stent placement  03/16/11    LAD x1 (3.0 mm 20 mm (postdilated - 3.3 mm) Resolute DES, LCx/OM1 x2; distal 2.5 mm x 14 mm and proximal 3.0 mm at 26 mm Resolute DES; post-dilation 2.9 distal, 3.25 proximal  . Carotid stent Left  Performed in [place]  . Doppler echocardiography  03/07/2011    EF 55-60%; moderate concentric LVH, mild posterolateral hypokinesis.  Grade 1 Diastolic Dysfunction with high filling pressures.  . Nm myoview ltd  September 2015    EF 67%,OINTERMEDIATE RISK - Inferior Ischemia c/w known anatomy (RCA CAD that is not PCI amenable)       Current Outpatient Prescriptions on File Prior to Visit  Medication Sig Dispense Refill  . ACCU-CHEK COMPACT PLUS test strip       . amitriptyline (ELAVIL) 10 MG tablet Take 10  mg by mouth at bedtime.      Marland Kitchen aspirin EC 81 MG tablet Take 81 mg by mouth daily.      Marland Kitchen atorvastatin (LIPITOR) 40 MG tablet take 1 tablet by mouth at bedtime  90 tablet  2  . BRILINTA 90 MG TABS tablet take 1 tablet by mouth twice a day  60 tablet  10  . fenofibrate 54 MG tablet take 1 tablet by mouth once daily  30 tablet  11  . furosemide (LASIX) 40 MG tablet Take 1 tablet (40 mg total) by mouth 3 (three) times daily.  90 tablet  5  . IFEREX 150 150 MG capsule take 1 capsule by mouth once daily  30 capsule  2  . insulin NPH (HUMULIN N,NOVOLIN N) 100 UNIT/ML injection Inject into the skin. 60 units in morning,60 units in evening      . insulin regular (NOVOLIN R,HUMULIN R) 100 units/mL injection Inject 40 Units into the skin 3 (three) times daily before meals.       Marland Kitchen losartan (COZAAR) 50 MG tablet Take 50 mg by mouth 2 (two) times daily.      . metoprolol tartrate (LOPRESSOR) 25 MG tablet take 1 tablet by mouth twice a day  60 tablet  11  . pantoprazole (PROTONIX) 40 MG tablet TAKE 1 TABLET BY MOUTH ONCE DAILY  30 tablet  5   No current facility-administered medications on file prior to visit.    ALLERGIES REVIEWED IN EPIC No Change in Social and Family History  ROS: A comprehensive Review of Systems - was performed Review of Systems  Constitutional: Negative for fever and chills.       More energy - but still tired.  HENT: Negative for nosebleeds.   Eyes: Negative for blurred vision.  Respiratory: Positive for shortness of breath. Negative for cough, hemoptysis and sputum production.        ON exertion  Cardiovascular: Positive for orthopnea and leg swelling. Negative for chest pain and claudication.       Per history of present illness  -- notably improved edema & DOE.  Gastrointestinal: Negative for heartburn, constipation, blood in stool and melena.  Genitourinary: Negative for hematuria and flank pain.  Musculoskeletal: Positive for joint pain. Negative for falls and myalgias.         Slow wide-based gait at baseline -- diffuse arthritis pain.  Neurological: Negative for sensory change, speech change, focal weakness, seizures, loss of consciousness and headaches.  Endo/Heme/Allergies: Does not bruise/bleed easily.  All other systems reviewed and are negative.   Wt Readings from Last 3 Encounters:  01/07/14 199 lb 8 oz (90.493 kg)  12/30/13 197 lb (89.359 kg)  12/16/13 197 lb 1.6 oz (89.404 kg)   PHYSICAL EXAM BP 121/61  Pulse 71  Ht 4\' 8"  (1.422 m)  Wt 199 lb 8 oz (90.493 kg)  BMI 44.75 kg/m2 General appearance: alert, cooperative, appears stated age,  no distress and moderate to severely obese. She back to her usual interactive and jovial mood.. Noticeably less respiratory distress. Neck: no adenopathy, no carotid bruit, but unable to assess JVP due to her body habitus. Lungs: Diminished breath sounds bilaterally because of body habitus. No rales or rhonchi heard. normal percussion bilaterally and non-labored Heart: regular rate and rhythm, S1, S2 normal but distant, no murmur, click, rub or gallop; unable to palpate PMI. Abdomen: soft, non-tender; bowel sounds normal; no masses,  no organomegaly; OB Extremities: extremities normal, atraumatic, no cyanosis, and trace edema Pulses: 2+ and symmetric; palpable= Neurologic: Mental status: Alert, oriented, thought content appropriate Cranial nerves: normal (II-XII grossly intact)   Adult ECG Report -- not performed  Recent Labs not available:   ASSESSMENT / PLAN: Chronic diastolic CHF (congestive heart failure), NYHA class 2 Improved symptoms with increased dose of Lasix. She still not back to her baseline weight. Her daughter thinks that she is gaining weight because of nighttime grazing food. Is hoping that her PCP will be starting an injection medication for diabetes at will also help with weight loss.  Plan: 3 times per week she will take 80 mg Lasix in the morning and 40 in evening as opposed to 40 twice  a day. Otherwise continue with sliding scale recommendations. Her borderline or pressure precludes me increasing her ARB dose for additional afterload reduction. I will increase her Imdur to 90 mg.  rest importance of low salt intake  CAD (coronary artery disease), CFX (x2) and LAD PCI in setting of non-STEMI and acute diastolic heart failure Abnormal Myoview, but consistent with known anatomy, therefore no invasive evaluation is recommended with her borderline renal insufficiency. The RCA is progression not revascularizable. I would like to consider Ranexa, but for now we'll simply increase her Imdur dose. She is also on Brilinta on aspirin as well as statin, beta blocker and ARB.  Obesity (BMI 30-39.9) I really think her daughter's right. The weight is really making it harder for read. The sizer abdominal obesity clearly in peds diaphragmatic excursion. We're fighting a losing now here because the [employer] she gains more dyspneic she gets meaning she does less activity.  A Foley within the diabetic treatment she will start to lose weight help her get more active and therefore lose more weight.. I tried really hard through the interpreter to explain her importance of weight loss and how much it plays in her symptoms.  DOE (dyspnea on exertion) Probably multifactorial. Diastolic dysfunction and even microvascular ischemia clearly plays a role with a nuclear stress test showing ischemia. It is also related to her obesity and probably obesity hypo-ventilation syndrome.  Recommended continued weight loss and continuing to try to walk/exercise.  HTN (hypertension), benign Unfortunately her blood pressures precluded the ability to increase afterload reduction  Preoperative cardiovascular examination He is due to have several teeth removed by the dental surgeon. They are awaiting the results of stress test for preoperative evaluation. By definition she does have active heart rate or symptoms, she  does have diabetes on insulin, and has had a stroke. Her renal function is abnormal but creatinine is below 2. She would be high risk for any significant surgery, but for simply teeth pole with local anesthesia, she should be fine. I would not do any additional evaluation. She is ordered on a beta blocker. She can stop Brilinta 5 days prior to and restart one to 2 days post procedure.   Every clinic visit this patient is extremely  time consuming requiring complex discussions were being done with the assistance of a language interpreter and the patient's daughter to understand dialect. The patient herself is minimally responsive in most of the history is from the daughter. She has multiple active clinical issue is and then has now preoperative evaluation. I spent close to an hour and 10 minutes in the patient's room on explaining the results of the stress test in her medications other results. We discussed her improvement in symptoms and recommendations for continued management.  No orders of the defined types were placed in this encounter.   Meds ordered this encounter  Medications  . amoxicillin (AMOXIL) 500 MG capsule    Sig: Take 1 capsule by mouth 4 (four) times daily.  . isosorbide mononitrate (IMDUR) 60 MG 24 hr tablet    Sig: Take 1.5 tablets (90 mg total) by mouth daily.    Dispense:  45 tablet    Refill:  6    Followup: 3 months   Marceil Welp W, M.D., M.S. Interventional Cardiologist   Pager # [phone]

## 2014-01-07 NOTE — Assessment & Plan Note (Signed)
Abnormal Myoview, but consistent with known anatomy, therefore no invasive evaluation is recommended with her borderline renal insufficiency. The RCA is progression not revascularizable. I would like to consider Ranexa, but for now we'll simply increase her Imdur dose. She is also on Brilinta on aspirin as well as statin, beta blocker and ARB.

## 2014-01-07 NOTE — Patient Instructions (Signed)
INCREASE ISOSORBIDE MN 60 MG --TAKE 1 AND 1/2 TABLETS DAILY  LASIX ON  Monday - WED- Friday  TAKE 2 TABLETS IN MORNING AND 1 TABLET IN THE EVENING, THE OTHER DAYS TAKE 1 TALBET TWICE A DAY.   Your physician wants you to follow-up in 3 MONTHS  DR harding. You will receive a reminder letter in the mail two months in advance. If you don't receive a letter, please call our office to schedule the follow-up appointment.

## 2014-01-07 NOTE — Assessment & Plan Note (Signed)
Probably multifactorial. Diastolic dysfunction and even microvascular ischemia clearly plays a role with a nuclear stress test showing ischemia. It is also related to her obesity and probably obesity hypo-ventilation syndrome.  Recommended continued weight loss and continuing to try to walk/exercise.

## 2014-01-13 ENCOUNTER — Encounter: Payer: Self-pay | Admitting: *Deleted

## 2014-01-13 ENCOUNTER — Telehealth: Payer: Self-pay | Admitting: *Deleted

## 2014-01-13 NOTE — Telephone Encounter (Signed)
LETTER ROUTED BY EPIC  TO DR RIGGS FOR CLEARANCE FOR TOOTH EXTRACTION

## 2014-03-11 ENCOUNTER — Other Ambulatory Visit: Payer: Self-pay | Admitting: Cardiology

## 2014-03-11 NOTE — Telephone Encounter (Signed)
E-sent medication to pharmacy  #90 x 3

## 2014-03-17 ENCOUNTER — Encounter (HOSPITAL_COMMUNITY): Payer: Self-pay | Admitting: Cardiology

## 2014-03-30 ENCOUNTER — Ambulatory Visit (INDEPENDENT_AMBULATORY_CARE_PROVIDER_SITE_OTHER): Payer: Medicare Other | Admitting: Cardiology

## 2014-03-30 ENCOUNTER — Encounter: Payer: Self-pay | Admitting: Cardiology

## 2014-03-30 VITALS — BP 120/67 | HR 68 | Ht <= 58 in | Wt 197.9 lb

## 2014-03-30 DIAGNOSIS — E785 Hyperlipidemia, unspecified: Secondary | ICD-10-CM

## 2014-03-30 DIAGNOSIS — E669 Obesity, unspecified: Secondary | ICD-10-CM

## 2014-03-30 DIAGNOSIS — I2510 Atherosclerotic heart disease of native coronary artery without angina pectoris: Secondary | ICD-10-CM

## 2014-03-30 DIAGNOSIS — Z9861 Coronary angioplasty status: Secondary | ICD-10-CM

## 2014-03-30 DIAGNOSIS — I5032 Chronic diastolic (congestive) heart failure: Secondary | ICD-10-CM

## 2014-03-30 DIAGNOSIS — I10 Essential (primary) hypertension: Secondary | ICD-10-CM

## 2014-03-30 DIAGNOSIS — Z8673 Personal history of transient ischemic attack (TIA), and cerebral infarction without residual deficits: Secondary | ICD-10-CM

## 2014-03-30 NOTE — Patient Instructions (Signed)
No change in medication Your physician wants you to follow-up in6 month DR Harding,30 MIN APPT.  You will receive a reminder letter in the mail two months in advance. If you don't receive a letter, please call our office to schedule the follow-up appointment.

## 2014-03-31 ENCOUNTER — Encounter: Payer: Self-pay | Admitting: Cardiology

## 2014-03-31 NOTE — Assessment & Plan Note (Addendum)
Overall relatively stable symptomatically. She does have an abnormal Myoview which correlates well with her known anatomy. At this time would not proceed with any invasive evaluations.  Continue increased dose of Imdur as that seems to have improved her symptoms. She remains on aspirin plus Brilinta for dual antiplatelet therapy. No bleeding concerns. She is on an ARB as well as beta blocker at stable doses. Is also on statin and fenofibrate. Diabetes is being closely monitored by her endocrinologist.

## 2014-03-31 NOTE — Assessment & Plan Note (Signed)
Previously well controlled, but no recent labs available. These are being followed closely by her other providers.

## 2014-03-31 NOTE — Assessment & Plan Note (Signed)
Stable symptoms. Continue current regimen of Lasix: 3 times a week taking 80 mg in the morning and 40 in the main body of the 4 days a week she'll take 40 mg twice a day. Continue sliding scale. She is on a better dose of ARB. She is also on a beta blocker, however would consider switching to Toprol.

## 2014-03-31 NOTE — Assessment & Plan Note (Signed)
Carotid Dopplers are very difficult by the time of her PCI in 2012. We will need to address possibly reevaluating carotid Dopplers see her back in 6 months. This always goes to the back burner with all of her other medical problems.

## 2014-03-31 NOTE — Assessment & Plan Note (Signed)
Pretty well controlled. Actually not able to up titrate after the reduction with ARB dose any further.

## 2014-03-31 NOTE — Assessment & Plan Note (Signed)
As usual the case, I continue to discuss the need to do exercise and physical activity. Whether this is going up in a couple steps or walking outside. She just needs to do some type of exercise. Basically she is not put on any more weight.

## 2014-03-31 NOTE — Progress Notes (Signed)
PCP: Jani Gravel, MD  Clinic Note: Chief Complaint  Patient presents with  . Follow-up    3 month follow up, Pt. experiencing swelling, Pt. denies chest pressure, pain, and SOB.    HPI: Danielle [name] is a 79 y.o. morbidly obese [place] woman who is accompanied as usual by her daughter and I contracted interpreter as she does not speak any [place]. Her daughter also serve to help discuss symptoms that she does not provide from history. Her daughter is excellent with English, and acknowledges that her mother is not forthcoming with symptoms. Mrs. Azad has severe multivessel CAD and underwent PCI to the LAD as well as circumflex but has basically non-revascularizable RCA. She is morbidly obese and essentially sedentary, partially because of her arthritis pains as well as dyspnea. She has chronic orthopnea. Several months ago she was having some worsening episodes of dyspnea that were somewhat concerning for possible ischemic symptoms similar to when she had her MI years ago. We did a Myoview which was intermediate risk but correlated well with her known disease. Since her symptoms have improved with medical therapy, we decided not to proceed with further invasive evaluation after a long discussion between her her family and myself.  Basically she has been doing quite well since her last visit in October. Her edema is improved as has her dyspnea. She still remains sedentary and sleeps on a flying which predated her MI. She continued to deny any chest discomfort with rest or exertion but still has exertional dyspnea all be it much improved. She seems to be at her stable dry weight which is 197 pounds here in the home is roughly 194 pounds. She denies any symptoms that would suggest PND and it is not that much residual edema.  She denies any rapid heartbeats or palpitations. No symptoms to suggest PND which would be difficult anyway since she is sitting up. She has not had syncope or near-syncope a  TIA/amaurosis fugax symptoms.   Past Medical History  Diagnosis Date  . History of Non-ST elevated myocardial infarction (non-STEMI) 03/06/2011    Presentation was acute diastolic heart failure with edema and pulmonary edema.  Symptom was dyspnea but not specifically anginal chest pressure.  Marland Kitchen CAD S/P percutaneous coronary angioplasty November 2012    a)Severe 3 Vessel, s/p PCI with Resolute DES to LAD x1, LCx/OM x2, diffise distal LAD & entire RCA disease - not PCI amenable;; b) Myoview 12/2013: EF 67%,OINTERMEDIATE RISK - Inferior Ischemia c/w known anatomy (RCA CAD that is not PCI amenable)     . Chronic diastolic heart failure, NYHA class 2-3     Grade 1 Diastolic Dysfunction by echo  . History of stroke with residual effects Unsure    Old left-sided CVA --> S/P Left Carotid STENT in [place];   . History of stroke without residual deficits August 2011    Right Internal Capsule Stroke, nonhemorrhagic  . Hyperlipidemia   . Obesity (BMI 30-39.9)   . Diabetes mellitus, type II, insulin dependent      Difficult to control; in obese, with CAD and PAD complications   . Hypertension   . CKD (chronic kidney disease), stage III   . Osteoarthritis   . History of seasonal allergies     Current Outpatient Prescriptions on File Prior to Visit  Medication Sig Dispense Refill  . ACCU-CHEK COMPACT PLUS test strip     . amitriptyline (ELAVIL) 10 MG tablet Take 10 mg by mouth at bedtime.    Marland Kitchen  aspirin EC 81 MG tablet Take 81 mg by mouth daily.    Marland Kitchen atorvastatin (LIPITOR) 40 MG tablet take 1 tablet by mouth at bedtime 90 tablet 3  . BRILINTA 90 MG TABS tablet take 1 tablet by mouth twice a day 60 tablet 10  . fenofibrate 54 MG tablet take 1 tablet by mouth once daily 30 tablet 11  . furosemide (LASIX) 40 MG tablet Take 1 tablet (40 mg total) by mouth 3 (three) times daily. (Patient taking differently: Take 40 mg by mouth 2 (two) times daily. ) 90 tablet 5  . IFEREX 150 150 MG capsule take 1 capsule by  mouth once daily 30 capsule 2  . insulin NPH (HUMULIN N,NOVOLIN N) 100 UNIT/ML injection Inject into the skin. 40 units in morning,60 units in evening    . insulin regular (NOVOLIN R,HUMULIN R) 100 units/mL injection Inject 40 Units into the skin 3 (three) times daily before meals.     . isosorbide mononitrate (IMDUR) 60 MG 24 hr tablet Take 1.5 tablets (90 mg total) by mouth daily. 45 tablet 6  . losartan (COZAAR) 50 MG tablet Take 50 mg by mouth daily.     . metoprolol tartrate (LOPRESSOR) 25 MG tablet take 1 tablet by mouth twice a day 60 tablet 11  . pantoprazole (PROTONIX) 40 MG tablet TAKE 1 TABLET BY MOUTH ONCE DAILY 30 tablet 5   No current facility-administered medications on file prior to visit.   Allergies  Allergen Reactions  . Lisinopril Cough    No Change in Social and Family History  ROS: A comprehensive Review of Systems - was performed Review of Systems  Constitutional: Negative for fever and chills.       Just not willing to do exercise  HENT: Negative for nosebleeds.   Eyes: Negative for blurred vision.  Respiratory: Positive for shortness of breath. Negative for wheezing.        ON exertion  Cardiovascular: Positive for orthopnea (Chronically sleeps sitting up; no change) and leg swelling (Notably improved). Negative for chest pain and claudication.  Gastrointestinal: Negative for heartburn, constipation, blood in stool and melena.  Genitourinary: Negative for hematuria and flank pain.  Musculoskeletal: Positive for joint pain. Negative for myalgias and falls.       Slow wide-based gait at baseline -- diffuse arthritis pain.  Neurological: Negative for sensory change, speech change, focal weakness, seizures, loss of consciousness and headaches.  Endo/Heme/Allergies: Does not bruise/bleed easily.  Psychiatric/Behavioral: Negative.   All other systems reviewed and are negative.   Wt Readings from Last 3 Encounters:  03/30/14 197 lb 14.4 oz (89.767 kg)  01/07/14  199 lb 8 oz (90.493 kg)  12/30/13 197 lb (89.359 kg)   PHYSICAL EXAM BP 120/67 mmHg  Pulse 68  Ht 4\' 8"  (1.422 m)  Wt 197 lb 14.4 oz (89.767 kg)  BMI 44.39 kg/m2 General appearance: alert, cooperative, appears stated age, no distress and moderate to severely obese. She back to her usual interactive and jovial mood.. Noticeably less respiratory distress. Neck: no adenopathy, no carotid bruit, but unable to assess JVP due to her body habitus. Lungs: Diminished breath sounds bilaterally because of body habitus. No rales or rhonchi heard. normal percussion bilaterally and non-labored Heart: regular rate and rhythm, S1, S2 normal but distant, no murmur, click, rub or gallop; unable to palpate PMI. Abdomen: soft, non-tender; bowel sounds normal; no masses,  no organomegaly; OB Extremities: extremities normal, atraumatic, no cyanosis, and trace edema Pulses: 2+ and symmetric;  palpable= Neurologic: Mental status: Alert, oriented, thought content appropriate Cranial nerves: normal (II-XII grossly intact)   Adult ECG Report -- not performed  Recent Labs not available: lipids are being monitored by her primary physician and her endocrinologist who also monitors or edema.  ASSESSMENT / PLAN: Relatively stable from a cardiac standpoint with an excellent regimen. As usual, bruises or or difficult requiring the use of an interpreter. Each visit last at least 25 minutes if not close to 45. His current visit was at least 30 minutes.   CAD (coronary artery disease), CFX (x2) and LAD PCI in setting of non-STEMI and acute diastolic heart failure Overall relatively stable symptomatically. She does have an abnormal Myoview which correlates well with her known anatomy. At this time would not proceed with any invasive evaluations.  Continue increased dose of Imdur as that seems to have improved her symptoms. She remains on aspirin plus Brilinta for dual antiplatelet therapy. No bleeding concerns. She is on an  ARB as well as beta blocker at stable doses. Is also on statin and fenofibrate. Diabetes is being closely monitored by her endocrinologist.  Chronic diastolic CHF (congestive heart failure), NYHA class 2 Stable symptoms. Continue current regimen of Lasix: 3 times a week taking 80 mg in the morning and 40 in the main body of the 4 days a week she'll take 40 mg twice a day. Continue sliding scale. She is on a better dose of ARB. She is also on a beta blocker, however would consider switching to Toprol.   Obesity (BMI 30-39.9) As usual the case, I continue to discuss the need to do exercise and physical activity. Whether this is going up in a couple steps or walking outside. She just needs to do some type of exercise. Basically she is not put on any more weight.  Dyslipidemia, goal LDL below 70 Previously well controlled, but no recent labs available. These are being followed closely by her other providers.  Essential hypertension Pretty well controlled. Actually not able to up titrate after the reduction with ARB dose any further.  History of stroke without residual deficits - status post Left Carotid Stent. Carotid Dopplers are very difficult by the time of her PCI in 2012. We will need to address possibly reevaluating carotid Dopplers see her back in 6 months. This always goes to the back burner with all of her other medical problems.    No orders of the defined types were placed in this encounter.   Meds ordered this encounter  Medications  . Dulaglutide 0.75 MG/0.5ML SOPN    Sig: Inject into the skin once a week. Once a week    Followup: 6 months   Mendy Chou, Leonie Green, M.D., M.S. Interventional Cardiologist   Pager # [phone]

## 2014-04-08 ENCOUNTER — Other Ambulatory Visit: Payer: Self-pay | Admitting: Cardiology

## 2014-04-08 NOTE — Telephone Encounter (Signed)
E sent to pharm 

## 2014-05-06 DIAGNOSIS — E1129 Type 2 diabetes mellitus with other diabetic kidney complication: Secondary | ICD-10-CM | POA: Diagnosis not present

## 2014-05-06 DIAGNOSIS — N184 Chronic kidney disease, stage 4 (severe): Secondary | ICD-10-CM | POA: Diagnosis not present

## 2014-05-06 DIAGNOSIS — I129 Hypertensive chronic kidney disease with stage 1 through stage 4 chronic kidney disease, or unspecified chronic kidney disease: Secondary | ICD-10-CM | POA: Diagnosis not present

## 2014-05-06 DIAGNOSIS — D631 Anemia in chronic kidney disease: Secondary | ICD-10-CM | POA: Diagnosis not present

## 2014-05-10 DIAGNOSIS — I10 Essential (primary) hypertension: Secondary | ICD-10-CM | POA: Diagnosis not present

## 2014-05-10 DIAGNOSIS — E1165 Type 2 diabetes mellitus with hyperglycemia: Secondary | ICD-10-CM | POA: Diagnosis not present

## 2014-05-17 ENCOUNTER — Other Ambulatory Visit (HOSPITAL_COMMUNITY): Payer: Self-pay | Admitting: *Deleted

## 2014-05-18 ENCOUNTER — Ambulatory Visit (HOSPITAL_COMMUNITY)
Admission: RE | Admit: 2014-05-18 | Discharge: 2014-05-18 | Disposition: A | Discharge status: Home / Self Care | Payer: Medicare Other | Source: Ambulatory Visit | Attending: Nephrology | Admitting: Nephrology

## 2014-05-18 DIAGNOSIS — D509 Iron deficiency anemia, unspecified: Secondary | ICD-10-CM | POA: Insufficient documentation

## 2014-05-18 MED ORDER — SODIUM CHLORIDE 0.9 % IV SOLN
1020.0000 mg | Freq: Once | INTRAVENOUS | Status: AC
  Administered 2014-05-18: 1020 mg via INTRAVENOUS
  Filled 2014-05-18: qty 34

## 2014-05-24 ENCOUNTER — Telehealth: Payer: Self-pay | Admitting: Cardiology

## 2014-05-24 NOTE — Telephone Encounter (Signed)
Pt's daughter called in stating that the pt needs to have 2 teeth extracted and Dr. Joneen Caraway needs approval from Dr. Ellyn Hack before scheduling the appt. The fax number to that office is [phone]. Please call  Thanks

## 2014-05-24 NOTE — Telephone Encounter (Signed)
Spoke to daughter  She states her mother need 7 teeth pulled.  The dentist will like an updated clearance for dental work. [patient was seen in 10 /2015. Daughter states patient did not go the dentist ,patient was scared and now she is in pain and would like to proceed  daughter is aware  Dr Ellyn Hack is not in office. Will defer Dr Ellyn Hack.

## 2014-05-25 NOTE — Telephone Encounter (Signed)
OK to hold Brilinta x 5 days pre-procedure. Hold 1-2 days post.  [facility]

## 2014-05-25 NOTE — Telephone Encounter (Signed)
Daughter calling again today,says somebody should be able to tell her something. She was informed Dr Ellyn Hack was not in the [place],she asked for [facility].I told her Ivin Booty was not here today,she said somebody should be able to help her.

## 2014-05-25 NOTE — Telephone Encounter (Signed)
Returned call to patient's daughter [place].She stated mother needs 7 teeth pulled by Dr.Riggs.Stated Dr.Riggs needs a clearance.Stated she needs to know if ok to hold Brilinta.Advised Dr.Harding out of office will send message to him for advice.

## 2014-05-26 ENCOUNTER — Encounter: Payer: Self-pay | Admitting: *Deleted

## 2014-05-26 NOTE — Telephone Encounter (Signed)
Spoke to daughter Informed her of Dr Judd Lien. Confirmed with daughter - the dentist is Dr Hardie Shackleton. She confirms yes. Letter routed.

## 2014-05-27 ENCOUNTER — Other Ambulatory Visit: Payer: Self-pay | Admitting: Cardiology

## 2014-05-27 DIAGNOSIS — E118 Type 2 diabetes mellitus with unspecified complications: Secondary | ICD-10-CM | POA: Diagnosis not present

## 2014-05-27 DIAGNOSIS — R2689 Other abnormalities of gait and mobility: Secondary | ICD-10-CM | POA: Diagnosis not present

## 2014-05-27 DIAGNOSIS — I10 Essential (primary) hypertension: Secondary | ICD-10-CM | POA: Diagnosis not present

## 2014-05-27 DIAGNOSIS — E1165 Type 2 diabetes mellitus with hyperglycemia: Secondary | ICD-10-CM | POA: Diagnosis not present

## 2014-05-27 DIAGNOSIS — E780 Pure hypercholesterolemia: Secondary | ICD-10-CM | POA: Diagnosis not present

## 2014-05-28 NOTE — Telephone Encounter (Signed)
Rx(s) sent to pharmacy electronically.  

## 2014-06-02 DIAGNOSIS — G589 Mononeuropathy, unspecified: Secondary | ICD-10-CM | POA: Diagnosis not present

## 2014-06-02 DIAGNOSIS — I10 Essential (primary) hypertension: Secondary | ICD-10-CM | POA: Diagnosis not present

## 2014-07-06 ENCOUNTER — Telehealth: Payer: Self-pay | Admitting: Cardiology

## 2014-07-06 NOTE — Telephone Encounter (Signed)
LEFT MESSAGE  SHE HAS DX OF CHRONIC  CHF--LAST ECHO 05/06/10 EF 55-60% QUESTION MAY CALL BACK

## 2014-07-06 NOTE — Telephone Encounter (Signed)
Danielle [name] was calling about the pt, wanting to know if she has a diagnosis of CHF and if she does, she will need her ejection fraction percentage sent to her. Please call back  Thanks

## 2014-07-07 ENCOUNTER — Telehealth: Payer: Self-pay | Admitting: Cardiology

## 2014-07-12 DIAGNOSIS — H40003 Preglaucoma, unspecified, bilateral: Secondary | ICD-10-CM | POA: Diagnosis not present

## 2014-07-12 DIAGNOSIS — E11359 Type 2 diabetes mellitus with proliferative diabetic retinopathy without macular edema: Secondary | ICD-10-CM | POA: Diagnosis not present

## 2014-07-12 NOTE — Telephone Encounter (Signed)
Close encounter 

## 2014-07-23 DIAGNOSIS — H40003 Preglaucoma, unspecified, bilateral: Secondary | ICD-10-CM | POA: Diagnosis not present

## 2014-07-23 DIAGNOSIS — Z961 Presence of intraocular lens: Secondary | ICD-10-CM | POA: Diagnosis not present

## 2014-07-23 DIAGNOSIS — E11359 Type 2 diabetes mellitus with proliferative diabetic retinopathy without macular edema: Secondary | ICD-10-CM | POA: Diagnosis not present

## 2014-07-23 DIAGNOSIS — H26492 Other secondary cataract, left eye: Secondary | ICD-10-CM | POA: Diagnosis not present

## 2014-08-12 ENCOUNTER — Other Ambulatory Visit: Payer: Self-pay | Admitting: Cardiology

## 2014-08-12 NOTE — Telephone Encounter (Signed)
Rx(s) sent to pharmacy electronically.  

## 2014-08-24 DIAGNOSIS — E780 Pure hypercholesterolemia: Secondary | ICD-10-CM | POA: Diagnosis not present

## 2014-08-24 DIAGNOSIS — I10 Essential (primary) hypertension: Secondary | ICD-10-CM | POA: Diagnosis not present

## 2014-08-24 DIAGNOSIS — D649 Anemia, unspecified: Secondary | ICD-10-CM | POA: Diagnosis not present

## 2014-08-31 DIAGNOSIS — I10 Essential (primary) hypertension: Secondary | ICD-10-CM | POA: Diagnosis not present

## 2014-08-31 DIAGNOSIS — E119 Type 2 diabetes mellitus without complications: Secondary | ICD-10-CM | POA: Diagnosis not present

## 2014-08-31 DIAGNOSIS — E039 Hypothyroidism, unspecified: Secondary | ICD-10-CM | POA: Diagnosis not present

## 2014-08-31 DIAGNOSIS — J029 Acute pharyngitis, unspecified: Secondary | ICD-10-CM | POA: Diagnosis not present

## 2014-09-16 ENCOUNTER — Other Ambulatory Visit: Payer: Self-pay | Admitting: Cardiology

## 2014-09-30 ENCOUNTER — Encounter: Payer: Self-pay | Admitting: Cardiology

## 2014-09-30 ENCOUNTER — Ambulatory Visit (INDEPENDENT_AMBULATORY_CARE_PROVIDER_SITE_OTHER): Payer: Medicare Other | Admitting: Cardiology

## 2014-09-30 VITALS — BP 116/60 | HR 74 | Ht 59.0 in | Wt 192.7 lb

## 2014-09-30 DIAGNOSIS — E785 Hyperlipidemia, unspecified: Secondary | ICD-10-CM | POA: Diagnosis not present

## 2014-09-30 DIAGNOSIS — N189 Chronic kidney disease, unspecified: Secondary | ICD-10-CM

## 2014-09-30 DIAGNOSIS — N183 Chronic kidney disease, stage 3 unspecified: Secondary | ICD-10-CM

## 2014-09-30 DIAGNOSIS — I214 Non-ST elevation (NSTEMI) myocardial infarction: Secondary | ICD-10-CM | POA: Diagnosis not present

## 2014-09-30 DIAGNOSIS — I5032 Chronic diastolic (congestive) heart failure: Secondary | ICD-10-CM | POA: Diagnosis not present

## 2014-09-30 DIAGNOSIS — N2889 Other specified disorders of kidney and ureter: Secondary | ICD-10-CM

## 2014-09-30 DIAGNOSIS — Z9861 Coronary angioplasty status: Secondary | ICD-10-CM

## 2014-09-30 DIAGNOSIS — I739 Peripheral vascular disease, unspecified: Secondary | ICD-10-CM

## 2014-09-30 DIAGNOSIS — R9431 Abnormal electrocardiogram [ECG] [EKG]: Secondary | ICD-10-CM

## 2014-09-30 DIAGNOSIS — E669 Obesity, unspecified: Secondary | ICD-10-CM

## 2014-09-30 DIAGNOSIS — I2510 Atherosclerotic heart disease of native coronary artery without angina pectoris: Secondary | ICD-10-CM | POA: Diagnosis not present

## 2014-09-30 DIAGNOSIS — I10 Essential (primary) hypertension: Secondary | ICD-10-CM

## 2014-09-30 NOTE — Patient Instructions (Signed)
STOP TAKING ASPIRIN 81MG   CONTINUE ALL OTHER MEDICATIONS.   Your physician wants you to follow-up in [place] HARDING.---30 MIN APPOINTMENT. You will receive a reminder letter in the mail two months in advance. If you don't receive a letter, please call our office to schedule the follow-up appointment.

## 2014-09-30 NOTE — Progress Notes (Signed)
PCP: Jani Gravel, MD  Clinic Note: Chief Complaint  Patient presents with  . Follow-up    no chest pain, no shortness of breath, edema- has in feet at night, no pain in legs, cramping in legs-somwtimes, no lightheadedness, no dizziness  . Coronary Artery Disease  . Congestive Heart Failure    Diastolic    HPI: Danielle [name] is a 79 y.o. female with a PMH below who presents today for 6 month f/u of CAD & DHF. Last seen in December 2015.  Danielle [name] has severe multivessel CAD and underwent PCI to the LAD as well as circumflex but has basically non-revascularizable RCA. She is morbidly obese and essentially sedentary, partially because of her arthritis pains as well as dyspnea. She has chronic orthopnea. During the summer-fall of 2015, she was having some worsening episodes of dyspnea that were somewhat concerning for possible ischemic symptoms similar to when she had her MI years ago. We did a Myoview which was intermediate risk but correlated well with her known disease. Since her symptoms have improved with medical therapy, we decided not to proceed with further invasive evaluation after a long discussion between her her family and myself.  Past Medical History  Diagnosis Date  . History of Non-ST elevated myocardial infarction (non-STEMI) 03/06/2011    Presentation was acute diastolic heart failure with edema and pulmonary edema.  Symptom was dyspnea but not specifically anginal chest pressure.  Marland Kitchen CAD S/P percutaneous coronary angioplasty November 2012    a)Severe 3 Vessel, s/p PCI with Resolute DES to LAD x1, LCx/OM x2, diffise distal LAD & entire RCA disease - not PCI amenable;; b) Myoview 12/2013: EF 67%,OINTERMEDIATE RISK - Inferior Ischemia c/w known anatomy (RCA CAD that is not PCI amenable)     . Chronic diastolic heart failure, NYHA class 2-3     Grade 1 Diastolic Dysfunction by echo  . History of stroke with residual effects Unsure    Old left-sided CVA --> S/P Left Carotid STENT in  [employer];   . History of stroke without residual deficits August 2011    Right Internal Capsule Stroke, nonhemorrhagic  . Hyperlipidemia   . Obesity (BMI 30-39.9)   . Diabetes mellitus, type II, insulin dependent      Difficult to control; in obese, with CAD and PAD complications   . Hypertension   . CKD (chronic kidney disease), stage III   . Osteoarthritis   . History of seasonal allergies     Prior Cardiac Evaluation and Past Surgical History: Past Surgical History  Procedure Laterality Date  . Cholecystectomy    . Coronary angioplasty with stent placement  03/16/11    LAD x1 (3.0 mm 20 mm (postdilated - 3.3 mm) Resolute DES, LCx/OM1 x2; distal 2.5 mm x 14 mm and proximal 3.0 mm at 26 mm Resolute DES; post-dilation 2.9 distal, 3.25 proximal  . Carotid stent Left     Performed in [place]  . Doppler echocardiography  03/07/2011    EF 55-60%; moderate concentric LVH, mild posterolateral hypokinesis.  Grade 1 Diastolic Dysfunction with high filling pressures.  . Nm myoview ltd  September 2015    EF 67%,OINTERMEDIATE RISK - Inferior Ischemia c/w known anatomy (RCA CAD that is not PCI amenable)     . Left heart catheterization with coronary angiogram N/A 03/12/2011    Procedure: LEFT HEART CATHETERIZATION WITH CORONARY ANGIOGRAM;  Surgeon: Leonie Man, MD;  Location: [facility] CATH LAB;  Service: Cardiovascular;  Laterality: N/A;  . Intravascular ultrasound  03/12/2011    Procedure: INTRAVASCULAR ULTRASOUND;  Surgeon: Leonie Man, MD;  Location: [facility] CATH LAB;  Service: Cardiovascular;;  . Percutaneous coronary stent intervention (pci-s) N/A 03/16/2011    Procedure: PERCUTANEOUS CORONARY STENT INTERVENTION (PCI-S);  Surgeon: Troy Sine, MD;  Location: [facility] CATH LAB;  Service: Cardiovascular;  Laterality: N/A;    Interval History: She actually comes in today feeling quite well. She actually looks the best that she has in quite some time to she seems to be in good spirits with no complaints.  Still is not doing much in the way of any activity, but is walking around the house some. She has lost some weight since her last visit. She always has exertional dyspnea but has not had any resting or exertional chest tightness or pressure. No longer troubled by her PND and orthopnea. Mild edema that usually goes down after elevating her feet. She has only had to take an additional dose of Lasix a couple times. She is down to taking her Lasix twice a day with additional doses when necessary as opposed to 3 times a day.  No palpitations, lightheadedness, dizziness, weakness or syncope/near syncope. No TIA/amaurosis fugax symptoms. No melena, hematochezia, hematuria, or epstaxis. No claudication.  ROS: A comprehensive was performed. Review of Systems  Constitutional: Positive for weight loss. Negative for malaise/fatigue.  Cardiovascular: Positive for leg swelling. Negative for claudication.       Per history of present illness  Gastrointestinal: Negative for heartburn.  Musculoskeletal: Positive for joint pain. Negative for myalgias and falls.  Neurological: Negative for dizziness and headaches.  Endo/Heme/Allergies: Does not bruise/bleed easily.  All other systems reviewed and are negative.   Current Outpatient Prescriptions on File Prior to Visit  Medication Sig Dispense Refill  . ACCU-CHEK COMPACT PLUS test strip     . amitriptyline (ELAVIL) 10 MG tablet Take 10 mg by mouth at bedtime. 3 pills at bedtime    . atorvastatin (LIPITOR) 40 MG tablet take 1 tablet by mouth at bedtime 90 tablet 3  . calcitRIOL (ROCALTROL) 0.25 MCG capsule Take 0.25 mcg by mouth every other day.    . fenofibrate 54 MG tablet take 1 tablet by mouth once daily 30 tablet 11  . furosemide (LASIX) 40 MG tablet TAKE 1 TABLET BY MOUTH 3 TIMES DAILY 90 tablet 5  . IFEREX 150 150 MG capsule take 1 capsule by mouth once daily 30 capsule 5  . insulin NPH (HUMULIN N,NOVOLIN N) 100 UNIT/ML injection Inject into the skin.  40 units in morning,40 units in evening    . insulin regular (NOVOLIN R,HUMULIN R) 100 units/mL injection Inject 40 Units into the skin 3 (three) times daily before meals.     . isosorbide mononitrate (IMDUR) 60 MG 24 hr tablet Take 1.5 tablets (90 mg total) by mouth daily. 45 tablet 7  . losartan (COZAAR) 50 MG tablet Take 50 mg by mouth daily.     . metoprolol tartrate (LOPRESSOR) 25 MG tablet take 1 tablet by mouth twice a day 60 tablet 11  . pantoprazole (PROTONIX) 40 MG tablet Take 1 tablet (40 mg total) by mouth daily. 30 tablet 10  . ticagrelor (BRILINTA) 90 MG TABS tablet Take 1 tablet (90 mg total) by mouth 2 (two) times daily. 60 tablet 10   No current facility-administered medications on file prior to visit.   Allergies  Allergen Reactions  . Lisinopril Cough    History  Substance Use Topics  . Smoking status: Never Smoker   .  Smokeless tobacco: Never Used  . Alcohol Use: No   History reviewed. No pertinent family history. She is not aware of any Family History  Wt Readings from Last 3 Encounters:  09/30/14 87.408 kg (192 lb 11.2 oz)  05/18/14 88.451 kg (195 lb)  03/30/14 89.767 kg (197 lb 14.4 oz)    PHYSICAL EXAM BP 116/60 mmHg  Pulse 74  Ht 4\' 11"  (1.499 m)  Wt 87.408 kg (192 lb 11.2 oz)  BMI 38.90 kg/m2 General appearance: alert, cooperative, appears stated age, no distress and moderately obese. She back to her usual interactive and jovial mood.. Noticeably less respiratory distress. Neck: no adenopathy, no carotid bruit, but unable to assess JVP due to her body habitus. Lungs: Diminished breath sounds bilaterally because of body habitus. No rales or rhonchi heard. normal percussion bilaterally and non-labored Heart: regular rate and rhythm, S1, S2 normal but distant, no murmur, click, rub or gallop; unable to palpate PMI. Abdomen: soft, non-tender; bowel sounds normal; no masses, no organomegaly; OB Extremities: extremities normal, atraumatic, no cyanosis, and  trace edema Pulses: 2+ and symmetric; palpable= Neurologic: Mental status: Alert, oriented, thought content appropriate Cranial nerves: normal (II-XII grossly intact)    Adult ECG Report  Rate: 74 ;  Rhythm: normal sinus rhythm , stable Non-specific ST-T abnormalities.  Narrative Interpretation: stable EKG.   Other studies Reviewed: Additional studies/ records that were reviewed today include: none Review of the above records demonstrates:  Recent Labs:  Recently checked by Dr. Jani Gravel, not currently available   ASSESSMENT / PLAN: Problem List Items Addressed This Visit    Abnormal resting ECG findings   CAD (coronary artery disease), CFX (x2) and LAD PCI in setting of non-STEMI and acute diastolic heart failure (Chronic)    Relatively stable with no recurrent anginal symptoms. She did have a positive ischemia on her Myoview, but not a lot of good PCI options. We will plan on continuing medical management. She is on a stable dose of Imdur as well as metoprolol and losartan.  It would be nice to increase to beta blocker some, but she has not been tolerant of that in the past. I think we have stopped the aspirin but continue Brilinta. We talked about potentially going down to 60 mg a day, but based on the amount of disease she has and the extent of stents, it is probably better to keep her on the higher doses while she is not having any bleeding issues. She is also on standing and when necessary Lasix for preload reduction to reduce microvascular ischemia and angina decubitus.      Relevant Orders   EKG 12-Lead (Completed)   Chronic diastolic CHF (congestive heart failure), NYHA class 2 (Chronic)    Symptoms continue current medications with beta blocker, ARB and diuretic. Continue sliding-scale diuretic. Less PND and orthopnea than she had last fall. I think we have her more euvolemic and with better afterload reduction.      Relevant Orders   EKG 12-Lead (Completed)   Chronic  renal insufficiency, stage III (moderate) (Chronic)   Dyslipidemia, goal LDL below 70 - Primary (Chronic)    Monitored by her endocrinologist as well as primary care physician. She is on standing dose of Lipitor      Relevant Orders   EKG 12-Lead (Completed)   Essential hypertension (Chronic)   Relevant Orders   EKG 12-Lead (Completed)   History of: Non-ST elevated myocardial infarction (non-STEMI) - inferior lateral ST depression, peak troponin 16 (Chronic)  She had classic heart failure presentation for her long-standing. She really did not have any significant chest tightness or pressure. Probably related to her diabetes, she has silent symptoms. Thankfully, she did not lose any systolic function, without significant wall motion abnormalities.      Relevant Orders   EKG 12-Lead (Completed)   Obesity (BMI 30-39.9) (Chronic)    She is finally lost some weight. I encouraged him to continue with dietary modification and continued to these wall some burns and calories. She is also doing some stationary exercises that may be helping as well.      Relevant Medications   Dulaglutide 0.75 MG/0.5ML SOPN   Other Relevant Orders   EKG 12-Lead (Completed)   PVD (peripheral vascular disease) (Chronic)   Relevant Orders   EKG 12-Lead (Completed)      Current medicines are reviewed at length with the patient today. (+/- concerns) none The following changes have been made: stop taking aspirin labs/ tests ordered today include:   Orders Placed This Encounter  Procedures  . EKG 12-Lead    Followup: 6 months    HARDING, Leonie Green, M.D., M.S. Interventional Cardiologist   Pager # [phone]

## 2014-10-01 ENCOUNTER — Encounter: Payer: Self-pay | Admitting: Cardiology

## 2014-10-01 NOTE — Assessment & Plan Note (Signed)
She had classic heart failure presentation for her long-standing. She really did not have any significant chest tightness or pressure. Probably related to her diabetes, she has silent symptoms. Thankfully, she did not lose any systolic function, without significant wall motion abnormalities.

## 2014-10-01 NOTE — Assessment & Plan Note (Signed)
Symptoms continue current medications with beta blocker, ARB and diuretic. Continue sliding-scale diuretic. Less PND and orthopnea than she had last fall. I think we have her more euvolemic and with better afterload reduction.

## 2014-10-01 NOTE — Assessment & Plan Note (Signed)
Monitored by her endocrinologist as well as primary care physician. She is on standing dose of Lipitor

## 2014-10-01 NOTE — Assessment & Plan Note (Signed)
Relatively stable with no recurrent anginal symptoms. She did have a positive ischemia on her Myoview, but not a lot of good PCI options. We will plan on continuing medical management. She is on a stable dose of Imdur as well as metoprolol and losartan.  It would be nice to increase to beta blocker some, but she has not been tolerant of that in the past. I think we have stopped the aspirin but continue Brilinta. We talked about potentially going down to 60 mg a day, but based on the amount of disease she has and the extent of stents, it is probably better to keep her on the higher doses while she is not having any bleeding issues. She is also on standing and when necessary Lasix for preload reduction to reduce microvascular ischemia and angina decubitus.

## 2014-10-01 NOTE — Assessment & Plan Note (Signed)
She is finally lost some weight. I encouraged him to continue with dietary modification and continued to these wall some burns and calories. She is also doing some stationary exercises that may be helping as well.

## 2014-10-12 DIAGNOSIS — E11359 Type 2 diabetes mellitus with proliferative diabetic retinopathy without macular edema: Secondary | ICD-10-CM | POA: Diagnosis not present

## 2014-10-25 DIAGNOSIS — E780 Pure hypercholesterolemia: Secondary | ICD-10-CM | POA: Diagnosis not present

## 2014-10-25 DIAGNOSIS — I10 Essential (primary) hypertension: Secondary | ICD-10-CM | POA: Diagnosis not present

## 2014-10-25 DIAGNOSIS — E1165 Type 2 diabetes mellitus with hyperglycemia: Secondary | ICD-10-CM | POA: Diagnosis not present

## 2014-10-25 DIAGNOSIS — R05 Cough: Secondary | ICD-10-CM | POA: Diagnosis not present

## 2014-11-10 DIAGNOSIS — R809 Proteinuria, unspecified: Secondary | ICD-10-CM | POA: Diagnosis not present

## 2014-11-10 DIAGNOSIS — E780 Pure hypercholesterolemia: Secondary | ICD-10-CM | POA: Diagnosis not present

## 2014-11-10 DIAGNOSIS — I10 Essential (primary) hypertension: Secondary | ICD-10-CM | POA: Diagnosis not present

## 2014-11-10 DIAGNOSIS — E1165 Type 2 diabetes mellitus with hyperglycemia: Secondary | ICD-10-CM | POA: Diagnosis not present

## 2014-11-22 DIAGNOSIS — H40003 Preglaucoma, unspecified, bilateral: Secondary | ICD-10-CM | POA: Diagnosis not present

## 2014-11-22 DIAGNOSIS — E11359 Type 2 diabetes mellitus with proliferative diabetic retinopathy without macular edema: Secondary | ICD-10-CM | POA: Diagnosis not present

## 2014-11-22 DIAGNOSIS — Z961 Presence of intraocular lens: Secondary | ICD-10-CM | POA: Diagnosis not present

## 2014-12-02 ENCOUNTER — Other Ambulatory Visit: Payer: Self-pay | Admitting: Cardiology

## 2014-12-02 NOTE — Telephone Encounter (Signed)
REFILL 

## 2015-03-07 DIAGNOSIS — D631 Anemia in chronic kidney disease: Secondary | ICD-10-CM | POA: Diagnosis not present

## 2015-03-07 DIAGNOSIS — N2581 Secondary hyperparathyroidism of renal origin: Secondary | ICD-10-CM | POA: Diagnosis not present

## 2015-03-07 DIAGNOSIS — E1129 Type 2 diabetes mellitus with other diabetic kidney complication: Secondary | ICD-10-CM | POA: Diagnosis not present

## 2015-03-07 DIAGNOSIS — I129 Hypertensive chronic kidney disease with stage 1 through stage 4 chronic kidney disease, or unspecified chronic kidney disease: Secondary | ICD-10-CM | POA: Diagnosis not present

## 2015-03-07 DIAGNOSIS — Z23 Encounter for immunization: Secondary | ICD-10-CM | POA: Diagnosis not present

## 2015-03-07 DIAGNOSIS — N184 Chronic kidney disease, stage 4 (severe): Secondary | ICD-10-CM | POA: Diagnosis not present

## 2015-03-10 ENCOUNTER — Other Ambulatory Visit: Payer: Self-pay | Admitting: Cardiology

## 2015-03-10 NOTE — Telephone Encounter (Signed)
REFILL 

## 2015-03-31 ENCOUNTER — Other Ambulatory Visit: Payer: Self-pay | Admitting: Cardiology

## 2015-03-31 NOTE — Telephone Encounter (Signed)
Rx(s) sent to pharmacy electronically.  

## 2015-04-07 ENCOUNTER — Other Ambulatory Visit: Payer: Self-pay | Admitting: Cardiology

## 2015-04-07 NOTE — Telephone Encounter (Signed)
Rx request sent to pharmacy.  

## 2015-04-19 ENCOUNTER — Ambulatory Visit: Payer: Self-pay | Admitting: Cardiology

## 2015-05-02 ENCOUNTER — Other Ambulatory Visit: Payer: Self-pay | Admitting: Cardiology

## 2015-05-02 NOTE — Telephone Encounter (Signed)
Rx request sent to pharmacy.  

## 2015-05-11 ENCOUNTER — Other Ambulatory Visit: Payer: Self-pay | Admitting: Cardiology

## 2015-05-11 DIAGNOSIS — H40003 Preglaucoma, unspecified, bilateral: Secondary | ICD-10-CM | POA: Diagnosis not present

## 2015-05-11 DIAGNOSIS — H31009 Unspecified chorioretinal scars, unspecified eye: Secondary | ICD-10-CM | POA: Diagnosis not present

## 2015-05-11 DIAGNOSIS — E113553 Type 2 diabetes mellitus with stable proliferative diabetic retinopathy, bilateral: Secondary | ICD-10-CM | POA: Diagnosis not present

## 2015-05-11 NOTE — Telephone Encounter (Signed)
Rx(s) sent to pharmacy electronically.  

## 2015-05-25 DIAGNOSIS — I10 Essential (primary) hypertension: Secondary | ICD-10-CM | POA: Diagnosis not present

## 2015-05-25 DIAGNOSIS — E1165 Type 2 diabetes mellitus with hyperglycemia: Secondary | ICD-10-CM | POA: Diagnosis not present

## 2015-05-25 DIAGNOSIS — E7800 Pure hypercholesterolemia, unspecified: Secondary | ICD-10-CM | POA: Diagnosis not present

## 2015-06-01 DIAGNOSIS — R197 Diarrhea, unspecified: Secondary | ICD-10-CM | POA: Diagnosis not present

## 2015-06-02 ENCOUNTER — Other Ambulatory Visit: Payer: Self-pay

## 2015-06-02 MED ORDER — ISOSORBIDE MONONITRATE ER 60 MG PO TB24: 90.0000 mg | ORAL_TABLET | Freq: Every day | ORAL | Status: DC

## 2015-06-02 NOTE — Telephone Encounter (Signed)
Rx(s) sent to pharmacy electronically.  

## 2015-06-09 ENCOUNTER — Other Ambulatory Visit: Payer: Self-pay | Admitting: *Deleted

## 2015-06-09 ENCOUNTER — Ambulatory Visit (INDEPENDENT_AMBULATORY_CARE_PROVIDER_SITE_OTHER): Payer: Medicare Other | Admitting: Cardiology

## 2015-06-09 VITALS — BP 118/58 | HR 83 | Ht 59.0 in | Wt 179.0 lb

## 2015-06-09 DIAGNOSIS — E785 Hyperlipidemia, unspecified: Secondary | ICD-10-CM

## 2015-06-09 DIAGNOSIS — I2510 Atherosclerotic heart disease of native coronary artery without angina pectoris: Secondary | ICD-10-CM | POA: Diagnosis not present

## 2015-06-09 DIAGNOSIS — I10 Essential (primary) hypertension: Secondary | ICD-10-CM

## 2015-06-09 DIAGNOSIS — M25561 Pain in right knee: Secondary | ICD-10-CM

## 2015-06-09 DIAGNOSIS — E669 Obesity, unspecified: Secondary | ICD-10-CM

## 2015-06-09 DIAGNOSIS — I214 Non-ST elevation (NSTEMI) myocardial infarction: Secondary | ICD-10-CM

## 2015-06-09 DIAGNOSIS — Z9861 Coronary angioplasty status: Secondary | ICD-10-CM

## 2015-06-09 DIAGNOSIS — I5032 Chronic diastolic (congestive) heart failure: Secondary | ICD-10-CM | POA: Diagnosis not present

## 2015-06-09 DIAGNOSIS — M25562 Pain in left knee: Secondary | ICD-10-CM

## 2015-06-09 MED ORDER — FUROSEMIDE 40 MG PO TABS: 40.0000 mg | ORAL_TABLET | Freq: Three times a day (TID) | ORAL | Status: DC

## 2015-06-09 NOTE — Progress Notes (Signed)
PCP: Danielle Gravel, MD  Clinic Note: Chief Complaint  Patient presents with  . Follow-up    6 MONTHS - CAD, HFPF  . Chest Pain    C/O NONE  . Shortness of Breath    C/O NONE  . Edema    C/O NONE    HPI: Danielle [name] is a 81 y.o. female with a PMH below who presents today for 9 month f/u of multivessel CAD-PCI along with Chronic DHF/HFPF.  NSTEMI with Acute Combined HF in Nov 2012 -- severe pulmonary edema; after diuresis, Cath with Staged PCI  PCI to LAD, LCx-OM with 2 stents; diffusely diseased RCA was not re-vascularizeable via PCI or CABG  Myoview in summer 2105 - INTERMEDIATE RISK - Inferior wall ischemia - but consistent with known RCA disease.  NO Invasive evaluation.  EF improved. - Now HFPF.  Prior L CVA (while in [place]) s/p L Carotid Stent  - minimal residual Sx.  Danielle [name] was last seen in June 2016 & was doing well.  Recent Hospitalizations: none Studies Reviewed: none  Her husband died last 01/30/15 after a struggle with cancer.  Since then, she has been quite depressed. She has not been eating as well, and has lost weight.  The up-side of this is that with weight loss, her knees & ankles are not as sore with walking & she has become more mobile around the house.  He walking is limited by foot & knee pain/weakness along with poor balance from neuropathy -- not limited by dyspnea.  Interval History: Danielle [name] presents today actually doing well.  To the extent that she is active, she has not had any recurrence of anginal CP or dyspnea with exertion.  She does get a bit SOB with exertion, but is clearly deconditioned & obese - this is less prominent following weight loss.   No recurrence of CHF Sx of PND, orthopnea or edema.  No palpitations, lightheadedness, dizziness, weakness or syncope/near syncope. No TIA/amaurosis fugax symptoms.   ROS: A comprehensive was performed. Review of Systems  Constitutional: Positive for weight loss (since the loss of her husband).  Negative for malaise/fatigue and diaphoresis.  HENT: Negative for congestion.   Respiratory: Negative for shortness of breath and wheezing.   Cardiovascular: Negative for claudication.  Gastrointestinal: Negative for heartburn, blood in stool and melena.  Genitourinary: Negative for hematuria.  Musculoskeletal: Positive for joint pain (knees). Negative for myalgias.  Neurological: Negative for dizziness (poor balance), weakness and headaches.  Endo/Heme/Allergies: Does not bruise/bleed easily.  All other systems reviewed and are negative.   Past Medical History  Diagnosis Date  . History of Non-ST elevated myocardial infarction (non-STEMI) 03/06/2011    Presentation was acute diastolic heart failure with edema and pulmonary edema.  Symptom was dyspnea but not specifically anginal chest pressure.  [place] CAD S/P percutaneous coronary angioplasty November 2012    a)Severe 3 Vessel, s/p PCI with Resolute DES to LAD x1, LCx/OM x2, diffise distal LAD & entire RCA disease - not PCI amenable;; b) Myoview 01-29-2014: EF 67%,OINTERMEDIATE RISK - Inferior Ischemia c/w known anatomy (RCA CAD that is not PCI amenable)     . Chronic diastolic heart failure, NYHA class 2-3     Grade 1 Diastolic Dysfunction by echo  . History of stroke with residual effects Unsure    Old left-sided CVA --> S/P Left Carotid STENT in [place];   . History of stroke without residual deficits August 2011    Right Internal  Capsule Stroke, nonhemorrhagic  . Hyperlipidemia   . Obesity (BMI 30-39.9)   . Diabetes mellitus, type II, insulin dependent ([place])      Difficult to control; in obese, with CAD and PAD complications   . Hypertension   . CKD (chronic kidney disease), stage III   . Osteoarthritis   . History of seasonal allergies     Past Surgical History  Procedure Laterality Date  . Cholecystectomy    . Coronary angioplasty with stent placement  03/16/11    LAD x1 (3.0 mm 20 mm (postdilated - 3.3 mm) Resolute DES, LCx/OM1  x2; distal 2.5 mm x 14 mm and proximal 3.0 mm at 26 mm Resolute DES; post-dilation 2.9 distal, 3.25 proximal  . Carotid stent Left     Performed in [place]  . Doppler echocardiography  03/07/2011    EF 55-60%; moderate concentric LVH, mild posterolateral hypokinesis.  Grade 1 Diastolic Dysfunction with high filling pressures.  . Nm myoview ltd  September 2015    EF 67%,OINTERMEDIATE RISK - Inferior Ischemia c/w known anatomy (RCA CAD that is not PCI amenable)     . Left heart catheterization with coronary angiogram N/A 03/12/2011    Procedure: LEFT HEART CATHETERIZATION WITH CORONARY ANGIOGRAM;  Surgeon: Leonie Man, MD;  Location: [facility] CATH LAB;  Service: Cardiovascular;  Laterality: N/A;  . Intravascular ultrasound  03/12/2011    Procedure: INTRAVASCULAR ULTRASOUND;  Surgeon: Leonie Man, MD;  Location: [facility] CATH LAB;  Service: Cardiovascular;;  . Percutaneous coronary stent intervention (pci-s) N/A 03/16/2011    Procedure: PERCUTANEOUS CORONARY STENT INTERVENTION (PCI-S);  Surgeon: Troy Sine, MD;  Location: [facility] C[facility] LAB;  Service: Cardiovascular;  Laterality: N/A;    Prior to Admission medications   Medication Sig Start Date End Date Taking? Authorizing Provider  ACCU-CHEK COMPACT PLUS test strip  10/18/12  Yes Historical Provider, MD  amitriptyline (ELAVIL) 10 MG tablet Take 10 mg by mouth at bedtime. 15 MG at bedtime   Yes Historical Provider, MD  atorvastatin (LIPITOR) 40 MG tablet take 1 tablet by mouth at bedtime 03/10/15  Yes Leonie Man, MD  B-D INS SYR ULTRAFINE 1CC/31G 31G X 5/16" 1 ML MISC Use as directed 09/16/14  Yes Historical Provider, MD  BRILINTA 90 MG TABS tablet take 1 tablet by mouth twice a day 05/02/15  Yes Leonie Man, MD  calcitRIOL (ROCALTROL) 0.25 MCG capsule Take 0.25 mcg by mouth every other day.   Yes Historical Provider, MD  Dulaglutide 0.75 MG/0.5ML SOPN Inject 1.25 mg into the skin once a week.   Yes Historical Provider, MD  fenofibrate 54 MG tablet  TAKE 1 TABLET BY MOUTH ONCE DAILY 12/02/14  Yes Leonie Man, MD  FERREX 150 150 MG capsule take 1 capsule by mouth once daily 12/02/14  Yes Leonie Man, MD  furosemide (LASIX) 40 MG tablet TAKE 1 TABLET BY MOUTH 3 TIMES DAILY Patient taking differently: TAKE 1 TABLET BY MOUTH 2 TIMES DAILY 09/16/14  Yes Leonie Man, MD  insulin NPH (HUMULIN N,NOVOLIN N) 100 UNIT/ML injection Inject into the skin. 30 units in morning,30 units in evening   Yes Historical Provider, MD  insulin regular (NOVOLIN R,HUMULIN R) 100 units/mL injection Inject 30 Units into the skin 2 (two) times daily before a meal.    Yes Historical Provider, MD  isosorbide mononitrate (IMDUR) 60 MG 24 hr tablet Take 1.5 tablets (90 mg total) by mouth daily. 06/02/15  Yes Leonie Man, MD  losartan (  COZAAR) 50 MG tablet Take 50 mg by mouth daily.    Yes Historical Provider, MD  metoprolol tartrate (LOPRESSOR) 25 MG tablet take 1 tablet by mouth twice a day 03/31/15  Yes Leonie Man, MD  pantoprazole (PROTONIX) 40 MG tablet take 1 tablet by mouth once daily 05/11/15  Yes Leonie Man, MD   Allergies  Allergen Reactions  . Lisinopril Cough     Social History   Social History  . Marital Status: Married    Spouse Name: N/A  . Number of Children: N/A  . Years of Education: N/A   Social History Main Topics  . Smoking status: Never Smoker   . Smokeless tobacco: Never Used  . Alcohol Use: No  . Drug Use: No  . Sexual Activity: No   Other Topics Concern  . None   Social History Narrative   She is a married, mother of 68 with 6 grandchildren.  She does not smoke or drink.  She is essentially sedentary, does not do much at all besides around the house.  She does range of motion exercises for a few minutes in the morning, but does not get routine walking or aerobic exercise.  She speaks no [place], but her daughter is essentially her primary care provider both in the house as well as for clinic visits.   History  reviewed. No pertinent family history.   Wt Readings from Last 3 Encounters:  06/09/15 179 lb (81.194 kg)  09/30/14 192 lb 11.2 oz (87.408 kg)  05/18/14 195 lb (88.451 kg)    PHYSICAL EXAM BP 118/58 mmHg  Pulse 83  Ht 4\' 11"  (1.499 m)  Wt 179 lb (81.194 kg)  BMI 36.13 kg/m2 General appearance: alert, cooperative, appears stated age, no distress and moderately obese; she actually seems to be much healthy-appearing today. She has lost a significant amount of weight. She is less jovial than usual however. Neck: no adenopathy, no carotid bruit and no JVD Lungs: clear to auscultation bilaterally, normal percussion bilaterally and non-labored Heart: regular rate and rhythm, distant but normal S1 & S2, no murmur, click, rub or gallop  Abdomen: soft, non-tender; bowel sounds normal; no masses,  no organomegaly; Extremities: extremities normal, atraumatic, no cyanosis, and edema  Pulses: 2+ and symmetric;  Skin: mobility and turgor normal or  Neurologic: Mental status: Alert, oriented, thought content appropriate Cranial nerves: normal (II-XII grossly intact)    Adult ECG Report  Rate: 81 ;  Rhythm: normal sinus rhythm and Normal axis, intervals and durations.;   Narrative Interpretation: Normal EKG, no change   Other studies Reviewed: Additional studies/ records that were reviewed today include:  Recent Labs:  Followed by PCP & Dr. Chalmers Cater (Endo)  ASSESSMENT / PLAN: Problem List Items Addressed This Visit    Obesity (BMI 30-39.9) (Chronic)    She has lost a significant amount of weight since the loss of her husband. Unfortunately this is not a great reason to lost weight, but wished to go management. She is starting to get more mobile and hopefully this will continue to trend.      History of: Non-ST elevated myocardial infarction (non-STEMI) - inferior lateral ST depression, peak troponin 16 (Chronic)    Her presentation was more of Acute CHF & not "angina". She has not had any  further episodes in the last 1.5 yrs of even CHF.  I do not think that she is active enough to truly test for exertional angina/dyspnea. Myoview showed inferior ischemia without infract.  Would not repeat ST unless Sx warrant.      Essential hypertension (Chronic)    Excellently controlled on current moderate doses of BB & ARB.       Dyslipidemia, goal LDL below 70 (Chronic)    She is on Fibrate & statin -- levels followed by Dr. Chalmers Cater (Endo).   No labs available.      Relevant Orders   EKG 12-Lead   Chronic diastolic CHF (congestive heart failure), NYHA class 2 (HCC) (Chronic)    Stable - no recurrent CHF Sx on current dose of Lasix -- they watch her weight & Sx & adjust dosage based upon Sliding Scale with PRN dosing. On ARB & BB. Less DOE with weight loss. No longer noting PND & Orthopnea with increased Lasix.      Relevant Orders   EKG 12-Lead   CAD (coronary artery disease), CFX (x2) and LAD PCI in setting of non-STEMI and acute diastolic heart failure - Primary (Chronic)    Stable, no angina chest discomfort or worsening HF Sx -- this is despite having evidence of RCA ischemia on ST.  She has LAD & Cx-OM stents -- > on Brilinta without ASA. On statin, BB & ARB along with Imdur. On Lasix (with Imdur) for DHF to reduce chance of DHF related ischemia.      Relevant Orders   EKG 12-Lead   Bilateral knee pain    Significantly limits her activity level. We discussed trying to go and do pool exercises, but she really does not like going out of the house.         Current medicines are reviewed at length with the patient today. (+/- concerns) NONE The following changes have been made: [place]  Your physician wants you to follow-up in 6 MONTHS WITH DR Sheetal Lyall.--30 MINS  Studies Ordered:   Orders Placed This Encounter  Procedures  . EKG 12-Lead     Leonie Man, M.D., M.S. Interventional Cardiologist   Pager # [phone] Phone  # [phone] [address] [place], [place] [address]

## 2015-06-09 NOTE — Patient Instructions (Addendum)
NO CHANGE IN CURRENT MEDICATIONS  Your physician wants you to follow-up in 6 MONTHS WITH DR HARDING.--30 MINS You will receive a reminder letter in the mail two months in advance. If you don't receive a letter, please call our office to schedule the follow-up appointment.  If you need a refill on your cardiac medications before your next appointment, please call your pharmacy.

## 2015-06-11 ENCOUNTER — Encounter: Payer: Self-pay | Admitting: Cardiology

## 2015-06-11 NOTE — Assessment & Plan Note (Signed)
Her presentation was more of Acute CHF & not "angina". She has not had any further episodes in the last 1.5 yrs of even CHF.  I do not think that she is active enough to truly test for exertional angina/dyspnea. Myoview showed inferior ischemia without infract.  Would not repeat ST unless Sx warrant.

## 2015-06-11 NOTE — Assessment & Plan Note (Signed)
Stable - no recurrent CHF Sx on current dose of Lasix -- they watch her weight & Sx & adjust dosage based upon Sliding Scale with PRN dosing. On ARB & BB. Less DOE with weight loss. No longer noting PND & Orthopnea with increased Lasix.

## 2015-06-11 NOTE — Assessment & Plan Note (Signed)
Excellently controlled on current moderate doses of BB & ARB.

## 2015-06-11 NOTE — Assessment & Plan Note (Signed)
Stable, no angina chest discomfort or worsening HF Sx -- this is despite having evidence of RCA ischemia on ST.  She has LAD & Cx-OM stents -- > on Brilinta without ASA. On statin, BB & ARB along with Imdur. On Lasix (with Imdur) for DHF to reduce chance of DHF related ischemia.

## 2015-06-11 NOTE — Assessment & Plan Note (Signed)
She is on Fibrate & statin -- levels followed by Dr. Chalmers Cater (Endo).   No labs available.

## 2015-06-11 NOTE — Assessment & Plan Note (Signed)
She has lost a significant amount of weight since the loss of her husband. Unfortunately this is not a great reason to lost weight, but wished to go management. She is starting to get more mobile and hopefully this will continue to trend.

## 2015-06-11 NOTE — Assessment & Plan Note (Signed)
Significantly limits her activity level. We discussed trying to go and do pool exercises, but she really does not like going out of the house.

## 2015-06-13 ENCOUNTER — Other Ambulatory Visit: Payer: Self-pay

## 2015-06-13 MED ORDER — FENOFIBRATE 54 MG PO TABS: 54.0000 mg | ORAL_TABLET | Freq: Every day | ORAL | Status: DC

## 2015-06-13 NOTE — Telephone Encounter (Signed)
Rx(s) sent to pharmacy electronically.  

## 2015-07-20 DIAGNOSIS — R197 Diarrhea, unspecified: Secondary | ICD-10-CM | POA: Diagnosis not present

## 2015-07-20 DIAGNOSIS — I10 Essential (primary) hypertension: Secondary | ICD-10-CM | POA: Diagnosis not present

## 2015-07-20 DIAGNOSIS — E1165 Type 2 diabetes mellitus with hyperglycemia: Secondary | ICD-10-CM | POA: Diagnosis not present

## 2015-07-21 ENCOUNTER — Other Ambulatory Visit (HOSPITAL_COMMUNITY): Payer: Self-pay | Admitting: Endocrinology

## 2015-07-21 DIAGNOSIS — E7800 Pure hypercholesterolemia, unspecified: Secondary | ICD-10-CM | POA: Diagnosis not present

## 2015-07-21 DIAGNOSIS — E0590 Thyrotoxicosis, unspecified without thyrotoxic crisis or storm: Secondary | ICD-10-CM | POA: Diagnosis not present

## 2015-07-21 DIAGNOSIS — N390 Urinary tract infection, site not specified: Secondary | ICD-10-CM | POA: Diagnosis not present

## 2015-07-21 DIAGNOSIS — R197 Diarrhea, unspecified: Secondary | ICD-10-CM | POA: Diagnosis not present

## 2015-07-21 DIAGNOSIS — I10 Essential (primary) hypertension: Secondary | ICD-10-CM | POA: Diagnosis not present

## 2015-07-21 DIAGNOSIS — E1165 Type 2 diabetes mellitus with hyperglycemia: Secondary | ICD-10-CM | POA: Diagnosis not present

## 2015-07-28 ENCOUNTER — Encounter (HOSPITAL_COMMUNITY)
Admission: RE | Admit: 2015-07-28 | Discharge: 2015-07-28 | Disposition: A | Discharge status: Home / Self Care | Payer: Medicare Other | Source: Ambulatory Visit | Attending: Endocrinology | Admitting: Endocrinology

## 2015-07-28 ENCOUNTER — Other Ambulatory Visit: Payer: Self-pay | Admitting: *Deleted

## 2015-07-28 DIAGNOSIS — E0590 Thyrotoxicosis, unspecified without thyrotoxic crisis or storm: Secondary | ICD-10-CM | POA: Insufficient documentation

## 2015-07-28 MED ORDER — TICAGRELOR 90 MG PO TABS: 90.0000 mg | ORAL_TABLET | Freq: Two times a day (BID) | ORAL | Status: DC

## 2015-07-28 MED ORDER — SODIUM IODIDE I 131 CAPSULE
11.5000 | Freq: Once | INTRAVENOUS | Status: AC | PRN
  Administered 2015-07-28: 11.5 via ORAL

## 2015-07-29 ENCOUNTER — Encounter (HOSPITAL_COMMUNITY)
Admission: RE | Admit: 2015-07-29 | Discharge: 2015-07-29 | Disposition: A | Discharge status: Home / Self Care | Payer: Medicare Other | Source: Ambulatory Visit | Attending: Endocrinology | Admitting: Endocrinology

## 2015-07-29 DIAGNOSIS — E0590 Thyrotoxicosis, unspecified without thyrotoxic crisis or storm: Secondary | ICD-10-CM | POA: Insufficient documentation

## 2015-07-29 DIAGNOSIS — E039 Hypothyroidism, unspecified: Secondary | ICD-10-CM | POA: Diagnosis not present

## 2015-07-29 MED ORDER — SODIUM PERTECHNETATE TC 99M INJECTION
10.0000 | Freq: Once | INTRAVENOUS | Status: AC | PRN
  Administered 2015-07-29: 10 via INTRAVENOUS

## 2015-09-09 ENCOUNTER — Other Ambulatory Visit: Payer: Self-pay

## 2015-09-09 MED ORDER — ATORVASTATIN CALCIUM 40 MG PO TABS: 40.0000 mg | ORAL_TABLET | Freq: Every day | ORAL | Status: DC

## 2015-09-19 DIAGNOSIS — E7800 Pure hypercholesterolemia, unspecified: Secondary | ICD-10-CM | POA: Diagnosis not present

## 2015-09-19 DIAGNOSIS — E039 Hypothyroidism, unspecified: Secondary | ICD-10-CM | POA: Diagnosis not present

## 2015-09-21 DIAGNOSIS — E0590 Thyrotoxicosis, unspecified without thyrotoxic crisis or storm: Secondary | ICD-10-CM | POA: Diagnosis not present

## 2015-09-21 DIAGNOSIS — E1165 Type 2 diabetes mellitus with hyperglycemia: Secondary | ICD-10-CM | POA: Diagnosis not present

## 2015-09-21 DIAGNOSIS — I10 Essential (primary) hypertension: Secondary | ICD-10-CM | POA: Diagnosis not present

## 2015-09-27 DIAGNOSIS — I10 Essential (primary) hypertension: Secondary | ICD-10-CM | POA: Diagnosis not present

## 2015-09-27 DIAGNOSIS — Z0000 Encounter for general adult medical examination without abnormal findings: Secondary | ICD-10-CM | POA: Diagnosis not present

## 2015-09-27 DIAGNOSIS — E7800 Pure hypercholesterolemia, unspecified: Secondary | ICD-10-CM | POA: Diagnosis not present

## 2015-09-27 DIAGNOSIS — E1165 Type 2 diabetes mellitus with hyperglycemia: Secondary | ICD-10-CM | POA: Diagnosis not present

## 2015-10-07 DIAGNOSIS — M545 Low back pain: Secondary | ICD-10-CM | POA: Diagnosis not present

## 2015-10-07 DIAGNOSIS — I10 Essential (primary) hypertension: Secondary | ICD-10-CM | POA: Diagnosis not present

## 2015-10-07 DIAGNOSIS — E7800 Pure hypercholesterolemia, unspecified: Secondary | ICD-10-CM | POA: Diagnosis not present

## 2015-10-07 DIAGNOSIS — E1165 Type 2 diabetes mellitus with hyperglycemia: Secondary | ICD-10-CM | POA: Diagnosis not present

## 2015-11-03 ENCOUNTER — Other Ambulatory Visit: Payer: Self-pay | Admitting: Cardiology

## 2015-11-03 NOTE — Telephone Encounter (Signed)
REFILL 

## 2015-12-01 ENCOUNTER — Other Ambulatory Visit: Payer: Self-pay | Admitting: Cardiology

## 2015-12-01 DIAGNOSIS — H40003 Preglaucoma, unspecified, bilateral: Secondary | ICD-10-CM | POA: Diagnosis not present

## 2015-12-01 DIAGNOSIS — Z961 Presence of intraocular lens: Secondary | ICD-10-CM | POA: Diagnosis not present

## 2015-12-01 DIAGNOSIS — E113593 Type 2 diabetes mellitus with proliferative diabetic retinopathy without macular edema, bilateral: Secondary | ICD-10-CM | POA: Diagnosis not present

## 2015-12-01 DIAGNOSIS — H18413 Arcus senilis, bilateral: Secondary | ICD-10-CM | POA: Diagnosis not present

## 2015-12-14 DIAGNOSIS — N189 Chronic kidney disease, unspecified: Secondary | ICD-10-CM | POA: Diagnosis not present

## 2015-12-14 DIAGNOSIS — E0590 Thyrotoxicosis, unspecified without thyrotoxic crisis or storm: Secondary | ICD-10-CM | POA: Diagnosis not present

## 2015-12-14 DIAGNOSIS — I10 Essential (primary) hypertension: Secondary | ICD-10-CM | POA: Diagnosis not present

## 2015-12-14 DIAGNOSIS — E1165 Type 2 diabetes mellitus with hyperglycemia: Secondary | ICD-10-CM | POA: Diagnosis not present

## 2015-12-22 ENCOUNTER — Other Ambulatory Visit: Payer: Self-pay | Admitting: Cardiology

## 2015-12-29 ENCOUNTER — Other Ambulatory Visit: Payer: Self-pay | Admitting: Cardiology

## 2015-12-29 NOTE — Telephone Encounter (Signed)
Rx(s) sent to pharmacy electronically.  

## 2016-01-05 ENCOUNTER — Other Ambulatory Visit: Payer: Self-pay | Admitting: Cardiology

## 2016-01-05 NOTE — Telephone Encounter (Signed)
Rx request sent to pharmacy.  

## 2016-01-30 ENCOUNTER — Encounter: Payer: Self-pay | Admitting: Cardiology

## 2016-01-30 ENCOUNTER — Ambulatory Visit (INDEPENDENT_AMBULATORY_CARE_PROVIDER_SITE_OTHER): Payer: Medicare Other | Admitting: Cardiology

## 2016-01-30 VITALS — BP 134/72 | HR 71 | Ht <= 58 in | Wt 185.6 lb

## 2016-01-30 DIAGNOSIS — I10 Essential (primary) hypertension: Secondary | ICD-10-CM | POA: Diagnosis not present

## 2016-01-30 DIAGNOSIS — I2510 Atherosclerotic heart disease of native coronary artery without angina pectoris: Secondary | ICD-10-CM

## 2016-01-30 DIAGNOSIS — I214 Non-ST elevation (NSTEMI) myocardial infarction: Secondary | ICD-10-CM

## 2016-01-30 DIAGNOSIS — I5032 Chronic diastolic (congestive) heart failure: Secondary | ICD-10-CM

## 2016-01-30 DIAGNOSIS — Z8673 Personal history of transient ischemic attack (TIA), and cerebral infarction without residual deficits: Secondary | ICD-10-CM

## 2016-01-30 DIAGNOSIS — E785 Hyperlipidemia, unspecified: Secondary | ICD-10-CM

## 2016-01-30 DIAGNOSIS — Z9861 Coronary angioplasty status: Secondary | ICD-10-CM

## 2016-01-30 DIAGNOSIS — E669 Obesity, unspecified: Secondary | ICD-10-CM

## 2016-01-30 NOTE — Patient Instructions (Signed)
Your physician discussed the importance of regular exercise and recommended that you start or continue a regular exercise program for good health.  INCREASE WALKING AS DISCUSSED WITH DR HARDING   NO CHANGES CONTINUE CURRENT MEDICATIONS CALL PHARMACY FOR ANY REFILLS.  Your physician wants you to follow-up in: IN 12 MONTHS WITH DR HARDING 30 MIN You will receive a reminder letter in the mail two months in advance. If you don't receive a letter, please call our office to schedule the follow-up appointment.

## 2016-01-30 NOTE — Progress Notes (Signed)
PCP: Jani Gravel, MD  Clinic Note: Chief Complaint  Patient presents with  . Coronary Artery Disease    6 month f/u    HPI: Danielle [name] is a 81 y.o. female with a PMH below who presents today for 9 month f/u of multivessel CAD-PCI along with Chronic DHF/HFPF.  NSTEMI with Acute Combined HF in Nov 2012 -- severe pulmonary edema; after diuresis, Cath with Staged PCI  PCI to LAD, LCx-OM with 2 stents; diffusely diseased RCA was not re-vascularizeable via PCI or CABG  Myoview in summer 2105 - INTERMEDIATE RISK - Inferior wall ischemia - but consistent with known RCA disease.  NO Invasive evaluation.  EF improved. - Now HFPF.  Prior L CVA (while in [place]) s/p L Carotid Stent  - minimal residual Sx.  Danielle [name] was last seen in June 2016 & was doing well.  Recent Hospitalizations: none Studies Reviewed: none  Her husband died last 2015-01-23 after a struggle with cancer.  Since then, she has been quite depressed. She has not been eating as well, and has lost weight.  The up-side of this is that with weight loss, her knees & ankles are not as sore with walking & she has become more mobile around the house.  He walking is limited by foot & knee pain/weakness along with poor balance from neuropathy -- not limited by dyspnea.  Interval History: Danielle [name] presents today actually doing well.  To the extent that she is active, she has not had any recurrence of anginal CP or dyspnea with exertion.  Her daughter relates that the patient basically spends most of the day on the couch reading the Bible and writing notes. She barely gets around the house doing anything, limited mostly by her arthritis pains. She does get a bit SOB with exertion, but is clearly deconditioned & obese.  She actually gained back some of the weight she has lost previously. She only takes 1 dose of Lasix a day now very infrequently taking more. If she does she has a tendency to me refill Limited dizzy and orthostatic  standpoint. She spent a long time sitting down and stand up and feels somewhat orthostatic.  No recurrence of CHF Sx of PND, orthopnea or edema.  No palpitations, lightheadedness, dizziness, weakness or syncope/near syncope. No TIA/amaurosis fugax symptoms.   ROS: A comprehensive was performed. Review of Systems  Constitutional: Negative for diaphoresis, malaise/fatigue and weight loss (Gained the weight back that she lost).  HENT: Positive for hearing loss (Cannot hear very well from the left ear). Negative for congestion.   Respiratory: Negative for shortness of breath and wheezing.   Cardiovascular: Positive for leg swelling (Well-controlled). Negative for claudication.  Gastrointestinal: Negative for blood in stool, heartburn and melena.  Genitourinary: Negative for hematuria.  Musculoskeletal: Positive for joint pain (knees). Negative for myalgias.  Neurological: Negative for dizziness (poor balance), loss of consciousness, weakness and headaches.  Endo/Heme/Allergies: Does not bruise/bleed easily.  Psychiatric/Behavioral: Negative for depression and memory loss. The patient is not nervous/anxious and does not have insomnia.   All other systems reviewed and are negative.   Past Medical History:  Diagnosis Date  . CAD S/P percutaneous coronary angioplasty November 2012   a)Severe 3 Vessel, s/p PCI with Resolute DES to LAD x1, LCx/OM x2, diffise distal LAD & entire RCA disease - not PCI amenable;; b) Myoview 01/22/14: EF 67%,OINTERMEDIATE RISK - Inferior Ischemia c/w known anatomy (RCA CAD that is not PCI amenable)     .  Chronic diastolic heart failure, NYHA class 2-3    Grade 1 Diastolic Dysfunction by echo  . CKD (chronic kidney disease), stage III   . Diabetes mellitus, type II, insulin dependent ([place])     Difficult to control; in obese, with CAD and PAD complications   . History of Non-ST elevated myocardial infarction (non-STEMI) 03/06/2011   Presentation was acute diastolic  heart failure with edema and pulmonary edema.  Symptom was dyspnea but not specifically anginal chest pressure.  Marland Kitchen History of seasonal allergies   . History of stroke with residual effects Unsure   Old left-sided CVA --> S/P Left Carotid STENT in [place];   . History of stroke without residual deficits August 2011   Right Internal Capsule Stroke, nonhemorrhagic  . Hyperlipidemia   . Hypertension   . Obesity (BMI 30-39.9)   . Osteoarthritis     Past Surgical History:  Procedure Laterality Date  . CAROTID STENT Left    Performed in [place]  . CHOLECYSTECTOMY    . CORONARY ANGIOPLASTY WITH STENT PLACEMENT  03/16/11   LAD x1 (3.0 mm 20 mm (postdilated - 3.3 mm) Resolute DES, LCx/OM1 x2; distal 2.5 mm x 14 mm and proximal 3.0 mm at 26 mm Resolute DES; post-dilation 2.9 distal, 3.25 proximal  . DOPPLER ECHOCARDIOGRAPHY  03/07/2011   EF 55-60%; moderate concentric LVH, mild posterolateral hypokinesis.  Grade 1 Diastolic Dysfunction with high filling pressures.  . INTRAVASCULAR ULTRASOUND  03/12/2011   Procedure: INTRAVASCULAR ULTRASOUND;  Surgeon: Leonie Man, MD;  Location: [facility] CATH LAB;  Service: Cardiovascular;;  . LEFT HEART CATHETERIZATION WITH CORONARY ANGIOGRAM N/A 03/12/2011   Procedure: LEFT HEART CATHETERIZATION WITH CORONARY ANGIOGRAM;  Surgeon: Leonie Man, MD;  Location: [facility] CATH LAB;  Service: Cardiovascular;  Laterality: N/A;  . NM MYOVIEW LTD  September 2015   EF 67%,OINTERMEDIATE RISK - Inferior Ischemia c/w known anatomy (RCA CAD that is not PCI amenable)     . PERCUTANEOUS CORONARY STENT INTERVENTION (PCI-S) N/A 03/16/2011   Procedure: PERCUTANEOUS CORONARY STENT INTERVENTION (PCI-S);  Surgeon: Troy Sine, MD;  Location[facility] CATH LAB;  Service: Cardiovascular;  Laterality: N/A;   Prior to Admission medications   Medication Sig Start Date End Date Taking? Authorizing Provider  ACCU-CHEK COMPACT PLUS test strip  10/18/12  Yes Historical Provider, MD  amitriptyline (ELAVIL)  25 MG tablet Take 25 mg by mouth at bedtime.   Yes Historical Provider, MD  atorvastatin (LIPITOR) 40 MG tablet Take 1 tablet (40 mg total) by mouth at bedtime. 09/09/15  Yes Leonie Man, MD  B-D INS SYR ULTRAFINE 1CC/31G 31G X 5/16" 1 ML MISC Use as directed 09/16/14  Yes Historical Provider, MD  BRILINTA 90 MG TABS tablet take 1 tablet by mouth twice a day 12/01/15  Yes Leonie Man, MD  calcitRIOL (ROCALTROL) 0.25 MCG capsule Take 0.25 mcg by mouth every other day.   Yes Historical Provider, MD  Dulaglutide 0.75 MG/0.5ML SOPN Inject 1.5 mg into the skin once a week.    Yes Historical Provider, MD  fenofibrate 54 MG tablet Take 1 tablet (54 mg total) by mouth daily. 06/13/15  Yes Leonie Man, MD  furosemide (LASIX) 40 MG tablet Take 1 tablet (40 mg total) by mouth 3 (three) times daily. 06/09/15  Yes Leonie Man, MD  insulin NPH (HUMULIN N,NOVOLIN N) 100 UNIT/ML injection Inject into the skin. 60 units in morning,40 units in evening   Yes Historical Provider, MD  insulin regular (NOVOLIN R,HUMULIN  R) 100 units/mL injection Inject into the skin. Pt take 40 units in the morning 20 units at lunch and 30 units in the evening   Yes Historical Provider, MD  iron polysaccharides (NIFEREX) 150 MG capsule Take 150 mg by mouth every other day.   Yes Historical Provider, MD  isosorbide mononitrate (IMDUR) 60 MG 24 hr tablet TAKE 1AND1/2 TABLET BY MOUTH ONCE DAILY 01/05/16  Yes Leonie Man, MD  losartan (COZAAR) 50 MG tablet Take 50 mg by mouth daily.    Yes Historical Provider, MD  metoprolol tartrate (LOPRESSOR) 25 MG tablet Take 1 tablet (25 mg total) by mouth 2 (two) times daily. PLEASE CONTACT OFFICE FOR ADDITIONAL REFILLS 12/29/15  Yes Leonie Man, MD  pantoprazole (PROTONIX) 40 MG tablet take 1 tablet by mouth once daily 05/11/15  Yes Leonie Man, MD    Allergies  Allergen Reactions  . Lisinopril Cough     Social History   Social History  . Marital status: Married    Spouse  name: N/A  . Number of children: N/A  . Years of education: N/A   Social History Main Topics  . Smoking status: Never Smoker  . Smokeless tobacco: Never Used  . Alcohol use No  . Drug use: No  . Sexual activity: No   Other Topics Concern  . None   Social History Narrative   She is a married, mother of 72 with 6 grandchildren.  She does not smoke or drink.  She is essentially sedentary, does not do much at all besides around the house.  She does range of motion exercises for a few minutes in the morning, but does not get routine walking or aerobic exercise.  She speaks no [place], but her daughter is essentially her primary care provider both in the house as well as for clinic visits.   History reviewed. No pertinent family history.   Wt Readings from Last 3 Encounters:  01/30/16 84.2 kg (185 lb 9.6 oz)  06/09/15 81.2 kg (179 lb)  09/30/14 87.4 kg (192 lb 11.2 oz)    PHYSICAL EXAM BP 134/72   Pulse 71   Ht 4\' 8"  (1.422 m)   Wt 84.2 kg (185 lb 9.6 oz)   BMI 41.61 kg/m  General appearance: alert, cooperative, appears stated age, no distress and moderately obese; she actually seems to be much healthy-appearing today. She has lost a significant amount of weight. She is less jovial than usual however. Neck: no adenopathy, no carotid bruit and no JVD Lungs: clear to auscultation bilaterally, normal percussion bilaterally and non-labored Heart: regular rate and rhythm, distant but normal S1 & S2, no murmur, click, rub or gallop  Abdomen: soft, non-tender; bowel sounds normal; no masses,  no organomegaly; Extremities: extremities normal, atraumatic, no cyanosis, and edema  Pulses: 2+ and symmetric;  Skin: mobility and turgor normal or  Neurologic: Mental status: Alert, oriented, thought content appropriate Cranial nerves: normal (II-XII grossly intact)    Adult ECG Report  Rate: 71;  Rhythm: normal sinus rhythm and 1 AV block (PR interval 242) Rightward axis of 94 (previously  was 86)(.;   Narrative Interpretation: Besides rightward axis and first-degree AV block, stable EKG. No notable change.   Other studies Reviewed: Additional studies/ records that were reviewed today include:  Recent Labs:  Followed by PCP & Dr. Chalmers Cater (Endo)  ASSESSMENT / PLAN: Problem List Items Addressed This Visit    PVD (peripheral vascular disease) ([place]) (Chronic)   Relevant Orders  EKG 12-Lead (Completed)   Obesity (BMI 30-39.9) (Chronic)    She had lost a lot of weight since her husband died, has gained about half of that weight back. I recommended that she continue to watch her diet episode but the back up her exercise level. She needs to do some walking even around the house if possible. I would like for her to get outside and walk around some, but she is very limited from a motor ability standpoint.  I encourage her to continue exercising and admonished her daughter to ensure that her diet continues to be healthy.      History of CVA (cerebrovascular accident) (Chronic)    No recurrent stroke symptoms.      Essential hypertension - Primary (Chronic)    Well-controlled on current dose of beta blocker and ARB.      Relevant Orders   EKG 12-Lead (Completed)   Dyslipidemia, goal LDL below 70 (Chronic)    She is on combination of statin and fenofibrate without any significant myalgias. Labs are being followed by her endocrinologist. I have not seen recent labs.      Chronic diastolic CHF (congestive heart failure), NYHA class 2 (HCC) (Chronic)    Stable without significant heart failure symptoms. She doesn't lay down flat during the day, but can lie down to 2 pillows at night and has no PND or orthopnea. Edema well-controlled only taking once daily Lasix. Very rarely that she take an additional dose. Is on a stable dose of beta blocker, ARB and nitrate.      Relevant Orders   EKG 12-Lead (Completed)   CAD (coronary artery disease), CFX (x2) and LAD PCI in setting of  non-STEMI and acute diastolic heart failure (Chronic)    Stable with no active anginal symptoms. She's not really having a significant heart failure symptoms either. Her RCA is essentially diffusely diseased subtotally occluded, but has stents in the LAD and circumflex. Currently stable on Brilinta without aspirin. On statin, Imdur, losartan and metoprolol tartrate == all in stable doses. No bleeding issues.  No change.       Other Visit Diagnoses   None.     Current medicines are reviewed at length with the patient today. (+/- concerns) NONE The following changes have been made: NONE  Patient Instructions  Your physician discussed the importance of regular exercise and recommended that you start or continue a regular exercise program for good health.  INCREASE WALKING AS DISCUSSED WITH DR Santi Troung   NO CHANGES CONTINUE CURRENT MEDICATIONS CALL PHARMACY FOR ANY REFILLS.  Your physician wants you to follow-up in: IN 12 MONTHS WITH DR Tel Hevia 30 MIN You will receive a reminder letter in the mail two months in advance. If you don't receive a letter, please call our office to schedule the follow-up appointment.   Studies Ordered:   Orders Placed This Encounter  Procedures  . EKG 12-Lead     Glenetta Hew, M.D., M.S. Interventional Cardiologist   Pager # [phone] Phone # [phone] [address] [place], [place] [address]

## 2016-01-31 ENCOUNTER — Encounter: Payer: Self-pay | Admitting: Cardiology

## 2016-01-31 NOTE — Assessment & Plan Note (Signed)
Stable without significant heart failure symptoms. She doesn't lay down flat during the day, but can lie down to 2 pillows at night and has no PND or orthopnea. Edema well-controlled only taking once daily Lasix. Very rarely that she take an additional dose. Is on a stable dose of beta blocker, ARB and nitrate.

## 2016-01-31 NOTE — Assessment & Plan Note (Signed)
She had lost a lot of weight since her husband died, has gained about half of that weight back. I recommended that she continue to watch her diet episode but the back up her exercise level. She needs to do some walking even around the house if possible. I would like for her to get outside and walk around some, but she is very limited from a motor ability standpoint.  I encourage her to continue exercising and admonished her daughter to ensure that her diet continues to be healthy.

## 2016-01-31 NOTE — Assessment & Plan Note (Signed)
She is on combination of statin and fenofibrate without any significant myalgias. Labs are being followed by her endocrinologist. I have not seen recent labs.

## 2016-01-31 NOTE — Assessment & Plan Note (Signed)
Stable with no active anginal symptoms. She's not really having a significant heart failure symptoms either. Her RCA is essentially diffusely diseased subtotally occluded, but has stents in the LAD and circumflex. Currently stable on Brilinta without aspirin. On statin, Imdur, losartan and metoprolol tartrate == all in stable doses. No bleeding issues.  No change.

## 2016-01-31 NOTE — Assessment & Plan Note (Signed)
No recurrent stroke symptoms.

## 2016-01-31 NOTE — Assessment & Plan Note (Signed)
Well-controlled on current dose of beta blocker and ARB.

## 2016-03-01 ENCOUNTER — Other Ambulatory Visit: Payer: Self-pay | Admitting: Cardiology

## 2016-03-05 NOTE — Telephone Encounter (Signed)
Rx(s) sent to pharmacy electronically.  

## 2016-03-08 DIAGNOSIS — I10 Essential (primary) hypertension: Secondary | ICD-10-CM | POA: Diagnosis not present

## 2016-03-08 DIAGNOSIS — E1165 Type 2 diabetes mellitus with hyperglycemia: Secondary | ICD-10-CM | POA: Diagnosis not present

## 2016-03-08 DIAGNOSIS — E0590 Thyrotoxicosis, unspecified without thyrotoxic crisis or storm: Secondary | ICD-10-CM | POA: Diagnosis not present

## 2016-03-15 DIAGNOSIS — E1165 Type 2 diabetes mellitus with hyperglycemia: Secondary | ICD-10-CM | POA: Diagnosis not present

## 2016-03-15 DIAGNOSIS — E7800 Pure hypercholesterolemia, unspecified: Secondary | ICD-10-CM | POA: Diagnosis not present

## 2016-03-15 DIAGNOSIS — R809 Proteinuria, unspecified: Secondary | ICD-10-CM | POA: Diagnosis not present

## 2016-03-15 DIAGNOSIS — I10 Essential (primary) hypertension: Secondary | ICD-10-CM | POA: Diagnosis not present

## 2016-03-20 DIAGNOSIS — E1165 Type 2 diabetes mellitus with hyperglycemia: Secondary | ICD-10-CM | POA: Diagnosis not present

## 2016-03-20 DIAGNOSIS — I10 Essential (primary) hypertension: Secondary | ICD-10-CM | POA: Diagnosis not present

## 2016-03-20 DIAGNOSIS — E7800 Pure hypercholesterolemia, unspecified: Secondary | ICD-10-CM | POA: Diagnosis not present

## 2016-03-29 ENCOUNTER — Other Ambulatory Visit: Payer: Self-pay | Admitting: Cardiology

## 2016-04-19 ENCOUNTER — Other Ambulatory Visit: Payer: Self-pay | Admitting: Cardiology

## 2016-05-24 ENCOUNTER — Other Ambulatory Visit: Payer: Self-pay | Admitting: Cardiology

## 2016-05-24 NOTE — Telephone Encounter (Signed)
Rx(s) sent to pharmacy electronically.  

## 2016-06-07 ENCOUNTER — Other Ambulatory Visit: Payer: Self-pay | Admitting: Cardiology

## 2016-06-08 NOTE — Telephone Encounter (Signed)
Rx(s) sent to pharmacy electronically.  

## 2016-07-19 ENCOUNTER — Other Ambulatory Visit: Payer: Self-pay | Admitting: Cardiology

## 2016-07-26 ENCOUNTER — Other Ambulatory Visit: Payer: Self-pay | Admitting: Cardiology

## 2016-07-26 NOTE — Telephone Encounter (Signed)
REFILL 

## 2016-09-06 ENCOUNTER — Other Ambulatory Visit: Payer: Self-pay | Admitting: Cardiology

## 2016-09-06 DIAGNOSIS — M79671 Pain in right foot: Secondary | ICD-10-CM | POA: Diagnosis not present

## 2016-09-06 DIAGNOSIS — M7731 Calcaneal spur, right foot: Secondary | ICD-10-CM | POA: Diagnosis not present

## 2016-09-13 ENCOUNTER — Ambulatory Visit: Payer: Self-pay | Admitting: Podiatry

## 2016-09-13 DIAGNOSIS — E7800 Pure hypercholesterolemia, unspecified: Secondary | ICD-10-CM | POA: Diagnosis not present

## 2016-09-13 DIAGNOSIS — E1165 Type 2 diabetes mellitus with hyperglycemia: Secondary | ICD-10-CM | POA: Diagnosis not present

## 2016-09-13 DIAGNOSIS — I10 Essential (primary) hypertension: Secondary | ICD-10-CM | POA: Diagnosis not present

## 2016-09-14 ENCOUNTER — Encounter: Payer: Self-pay | Admitting: Podiatry

## 2016-09-14 ENCOUNTER — Ambulatory Visit (INDEPENDENT_AMBULATORY_CARE_PROVIDER_SITE_OTHER): Payer: Medicare Other | Admitting: Podiatry

## 2016-09-14 VITALS — BP 150/69 | HR 71 | Resp 16 | Ht 59.0 in | Wt 185.0 lb

## 2016-09-14 DIAGNOSIS — S99921A Unspecified injury of right foot, initial encounter: Secondary | ICD-10-CM

## 2016-09-14 DIAGNOSIS — L923 Foreign body granuloma of the skin and subcutaneous tissue: Secondary | ICD-10-CM

## 2016-09-14 NOTE — Progress Notes (Signed)
   Subjective:    Patient ID: Danielle [name], female    DOB: October 27, 1934, 82 y.o.   MRN: [id number]  HPI Chief Complaint  Patient presents with  . Foot Injury    Right foot; bottom of heel (lateral side); pt's daughter stated, "Stepped on a piece of glass a month ago; foot is still sore; went to drs 3-4 days ago, took xray but dr did not see anything in the foot"      Review of Systems  All other systems reviewed and are negative.      Objective:   Physical Exam        Assessment & Plan:

## 2016-09-14 NOTE — Progress Notes (Signed)
Subjective:    Patient ID: Danielle [name], female   DOB: 82 y.o.   MRN: [id number]   HPI patient presents with caregiver stating that she stepped on a form of glass on the bottom of her right heel and she still feels like it's there and she is a diabetic and is very nervous    Review of Systems  All other systems reviewed and are negative.       Objective:  Physical Exam  Constitutional: She is oriented to person, place, and time.  Cardiovascular: Intact distal pulses.   Musculoskeletal: Normal range of motion.  Neurological: She is alert and oriented to person, place, and time.  Skin: Skin is warm and dry.  Nursing note and vitals reviewed.  neurovascular status found to be intact with muscle strength adequate range of motion within normal limits. Patient's noted to have significant reactive tissue plantar aspect of the right heel lateral side with inflammation within the area and quite a bit of pain when pressed from a dorsal direction. Found have good digital perfusion and well oriented 3     Assessment:    Probability for deep foreign body right plantar lateral heel that may be very difficult to find     Plan:    H&P and previous x-ray reviewed and I went ahead today and I anesthetized 60 Milligan times like Marcaine mixture. Sterile prep applied to the foot and then utilizing sharp sterile instrumentation I entered the area and found a small piece of sharp glass within the subcutaneous layer. I removed this in toto I further explored did not find any other glass I flushed the area and applied sterile dressing and instructed on soaks and Neosporin and bandage usage. Patient be seen back if symptoms persist or any other issues were to occur

## 2016-09-20 DIAGNOSIS — E1165 Type 2 diabetes mellitus with hyperglycemia: Secondary | ICD-10-CM | POA: Diagnosis not present

## 2016-09-24 DIAGNOSIS — N390 Urinary tract infection, site not specified: Secondary | ICD-10-CM | POA: Diagnosis not present

## 2016-09-24 DIAGNOSIS — I10 Essential (primary) hypertension: Secondary | ICD-10-CM | POA: Diagnosis not present

## 2016-09-24 DIAGNOSIS — E1165 Type 2 diabetes mellitus with hyperglycemia: Secondary | ICD-10-CM | POA: Diagnosis not present

## 2016-09-28 DIAGNOSIS — Z23 Encounter for immunization: Secondary | ICD-10-CM | POA: Diagnosis not present

## 2016-09-28 DIAGNOSIS — N184 Chronic kidney disease, stage 4 (severe): Secondary | ICD-10-CM | POA: Diagnosis not present

## 2016-09-28 DIAGNOSIS — E7800 Pure hypercholesterolemia, unspecified: Secondary | ICD-10-CM | POA: Diagnosis not present

## 2016-09-28 DIAGNOSIS — I10 Essential (primary) hypertension: Secondary | ICD-10-CM | POA: Diagnosis not present

## 2016-09-28 DIAGNOSIS — E1165 Type 2 diabetes mellitus with hyperglycemia: Secondary | ICD-10-CM | POA: Diagnosis not present

## 2016-10-25 ENCOUNTER — Other Ambulatory Visit: Payer: Self-pay | Admitting: Cardiology

## 2016-11-08 ENCOUNTER — Other Ambulatory Visit: Payer: Self-pay | Admitting: Cardiology

## 2016-11-22 ENCOUNTER — Other Ambulatory Visit: Payer: Self-pay | Admitting: Cardiology

## 2016-12-05 DIAGNOSIS — H40003 Preglaucoma, unspecified, bilateral: Secondary | ICD-10-CM | POA: Diagnosis not present

## 2016-12-05 DIAGNOSIS — H31009 Unspecified chorioretinal scars, unspecified eye: Secondary | ICD-10-CM | POA: Diagnosis not present

## 2016-12-05 DIAGNOSIS — E113553 Type 2 diabetes mellitus with stable proliferative diabetic retinopathy, bilateral: Secondary | ICD-10-CM | POA: Diagnosis not present

## 2016-12-05 DIAGNOSIS — H35352 Cystoid macular degeneration, left eye: Secondary | ICD-10-CM | POA: Diagnosis not present

## 2016-12-13 ENCOUNTER — Other Ambulatory Visit: Payer: Self-pay | Admitting: Cardiology

## 2016-12-21 ENCOUNTER — Other Ambulatory Visit: Payer: Self-pay | Admitting: Cardiology

## 2016-12-26 DIAGNOSIS — E1165 Type 2 diabetes mellitus with hyperglycemia: Secondary | ICD-10-CM | POA: Diagnosis not present

## 2016-12-26 DIAGNOSIS — E7800 Pure hypercholesterolemia, unspecified: Secondary | ICD-10-CM | POA: Diagnosis not present

## 2016-12-26 DIAGNOSIS — I10 Essential (primary) hypertension: Secondary | ICD-10-CM | POA: Diagnosis not present

## 2017-01-01 DIAGNOSIS — I10 Essential (primary) hypertension: Secondary | ICD-10-CM | POA: Diagnosis not present

## 2017-01-01 DIAGNOSIS — E039 Hypothyroidism, unspecified: Secondary | ICD-10-CM | POA: Diagnosis not present

## 2017-01-01 DIAGNOSIS — Z23 Encounter for immunization: Secondary | ICD-10-CM | POA: Diagnosis not present

## 2017-01-01 DIAGNOSIS — E7800 Pure hypercholesterolemia, unspecified: Secondary | ICD-10-CM | POA: Diagnosis not present

## 2017-01-01 DIAGNOSIS — E1165 Type 2 diabetes mellitus with hyperglycemia: Secondary | ICD-10-CM | POA: Diagnosis not present

## 2017-01-16 DIAGNOSIS — I10 Essential (primary) hypertension: Secondary | ICD-10-CM | POA: Diagnosis not present

## 2017-01-16 DIAGNOSIS — E7800 Pure hypercholesterolemia, unspecified: Secondary | ICD-10-CM | POA: Diagnosis not present

## 2017-01-16 DIAGNOSIS — E1165 Type 2 diabetes mellitus with hyperglycemia: Secondary | ICD-10-CM | POA: Diagnosis not present

## 2017-01-16 DIAGNOSIS — R809 Proteinuria, unspecified: Secondary | ICD-10-CM | POA: Diagnosis not present

## 2017-01-22 ENCOUNTER — Ambulatory Visit (INDEPENDENT_AMBULATORY_CARE_PROVIDER_SITE_OTHER): Payer: Medicare Other | Admitting: Cardiology

## 2017-01-22 ENCOUNTER — Encounter: Payer: Self-pay | Admitting: Cardiology

## 2017-01-22 VITALS — BP 106/40 | HR 82 | Ht 59.0 in | Wt 189.2 lb

## 2017-01-22 DIAGNOSIS — I739 Peripheral vascular disease, unspecified: Secondary | ICD-10-CM | POA: Diagnosis not present

## 2017-01-22 DIAGNOSIS — I5032 Chronic diastolic (congestive) heart failure: Secondary | ICD-10-CM | POA: Diagnosis not present

## 2017-01-22 DIAGNOSIS — Z9861 Coronary angioplasty status: Secondary | ICD-10-CM

## 2017-01-22 DIAGNOSIS — I2510 Atherosclerotic heart disease of native coronary artery without angina pectoris: Secondary | ICD-10-CM | POA: Diagnosis not present

## 2017-01-22 DIAGNOSIS — E669 Obesity, unspecified: Secondary | ICD-10-CM | POA: Diagnosis not present

## 2017-01-22 DIAGNOSIS — I214 Non-ST elevation (NSTEMI) myocardial infarction: Secondary | ICD-10-CM | POA: Diagnosis not present

## 2017-01-22 DIAGNOSIS — E785 Hyperlipidemia, unspecified: Secondary | ICD-10-CM

## 2017-01-22 DIAGNOSIS — I10 Essential (primary) hypertension: Secondary | ICD-10-CM

## 2017-01-22 NOTE — Patient Instructions (Signed)
NO CHANGE WITH CURRENT MEDICATIONS   Your physician wants you to follow-up in 12 MONTHS WITH DR HARDING. You will receive a reminder letter in the mail two months in advance. If you don't receive a letter, please call our office to schedule the follow-up appointment.     If you need a refill on your cardiac medications before your next appointment, please call your pharmacy.  

## 2017-01-22 NOTE — Progress Notes (Signed)
PCP: Jani Gravel, MD  Clinic Note: Chief Complaint  Patient presents with  . Follow-up  . Coronary Artery Disease    HPI: Danielle [name] is a 82 y.o. female with a PMH below who presents today for 9 month f/u of multivessel CAD-PCI along with Chronic DHF/HFPF.  NSTEMI with Acute Combined HF in Nov 2012 -- severe pulmonary edema; after diuresis, Cath with Staged PCI  PCI to LAD, LCx-OM with 2 stents; diffusely diseased RCA was not re-vascularizeable via PCI or CABG  Myoview in summer 2105 - INTERMEDIATE RISK - Inferior wall ischemia - but consistent with known RCA disease.  NO Invasive evaluation.  EF improved. - Now HFPF.  Prior L CVA (while in [place]) s/p L Carotid Stent  - minimal residual Sx.  Walking is limited by knee and foot pain as well as weakness and poor balance from neuropathy.  Danielle [name] was last seen in Oct 2017 was doing well.  Recent Hospitalizations: none Studies Reviewed: none  Her daughter serves as an Astronomer. Each time that we have tried to use hired interpreter, the patient does not usually respond to her questions but will talk to her daughter. In these cases usually the interpreter sits and listens  Interval History: Danielle [name] presents today as usual, Danielle [name] is not very talkative until the end of the visit. Most of her answers are very quick short and to the point. The answers asked sound quite detailed, however the answers are very short. Basically, Danielle [name] is stable Danielle [name] does not do hardly any type of exercise. Danielle [name] does may be one or 2 minutes of treadmill a day, but this is limited more by knee & ankle pain as opposed to shortness of breath or chest pain.  To the extent that Danielle [name] is active, Danielle [name] denies dyspnea or chest pain/pressure to suggest angina.  Danielle [name] denies PND or orthopnea & her edema is well controlled.  Danielle [name] seems to be getting by a bit better now 2 years out from loosing her husband.  Her daughter does note that Danielle [name] is pretty much total care & requires  help with most things including basic ADLs.  Danielle [name] does get a bit orthostatic when standing after sitting a long time.   Other Cardiovascular ROS: positive for - dyspnea on exertion and poor energy negative for - chest pain, irregular heartbeat, murmur, orthopnea, palpitations, paroxysmal nocturnal dyspnea, rapid heart rate, shortness of breath or syncope/near syncope, TIA/amaurosis fugax. No claudication.  ROS: A comprehensive was performed. Review of Systems  Constitutional: Negative for malaise/fatigue and weight loss (Gained the weight back that Danielle [name] lost).  HENT: Positive for hearing loss (Cannot hear very well from the left ear). Negative for congestion.   Respiratory: Negative for cough, shortness of breath and wheezing.   Cardiovascular: Positive for leg swelling (Well-controlled). Negative for claudication.  Gastrointestinal: Negative for blood in stool, heartburn and melena.  Genitourinary: Negative for hematuria.  Musculoskeletal: Positive for joint pain (knees). Negative for myalgias.  Neurological: Negative for dizziness (poor balance), focal weakness, loss of consciousness, weakness and headaches.  Endo/Heme/Allergies: Does not bruise/bleed easily.  Psychiatric/Behavioral: Negative for depression (still a bit "down" since her husband died) and memory loss. The patient is not nervous/anxious and does not have insomnia.   All other systems reviewed and are negative.   Past Medical History:  Diagnosis Date  . CAD S/P percutaneous coronary angioplasty November 2012   a)Severe 3 Vessel, s/p PCI with Resolute DES to LAD x1, LCx/OM x2,  diffise distal LAD & entire RCA disease - not PCI amenable;; b) Myoview 12/2013: EF 67%,OINTERMEDIATE RISK - Inferior Ischemia c/w known anatomy (RCA CAD that is not PCI amenable)     . Chronic diastolic heart failure, NYHA class 2-3    Grade 1 Diastolic Dysfunction by echo  . CKD (chronic kidney disease), stage III ([place])   . Diabetes mellitus, type II,  insulin dependent ([place])     Difficult to control; in obese, with CAD and PAD complications   . History of Non-ST elevated myocardial infarction (non-STEMI) 03/06/2011   Presentation was acute diastolic heart failure with edema and pulmonary edema.  Symptom was dyspnea but not specifically anginal chest pressure.  Marland Kitchen History of seasonal allergies   . History of stroke with residual effects Unsure   Old left-sided CVA --> S/P Left Carotid STENT in [place];   . History of stroke without residual deficits August 2011   Right Internal Capsule Stroke, nonhemorrhagic  . Hyperlipidemia   . Hypertension   . Obesity (BMI 30-39.9)   . Osteoarthritis     Past Surgical History:  Procedure Laterality Date  . CAROTID STENT Left    Performed in [place]  . CHOLECYSTECTOMY    . CORONARY ANGIOPLASTY WITH STENT PLACEMENT  03/16/11   LAD x1 (3.0 mm 20 mm (postdilated - 3.3 mm) Resolute DES, LCx/OM1 x2; distal 2.5 mm x 14 mm and proximal 3.0 mm at 26 mm Resolute DES; post-dilation 2.9 distal, 3.25 proximal  . DOPPLER ECHOCARDIOGRAPHY  03/07/2011   EF 55-60%; moderate concentric LVH, mild posterolateral hypokinesis.  Grade 1 Diastolic Dysfunction with high filling pressures.  . INTRAVASCULAR ULTRASOUND  03/12/2011   Procedure: INTRAVASCULAR ULTRASOUND;  Surgeon: Leonie Man, MD;  Location[facility] C[facility] LAB;  Service: Cardiovascular;;  . LEFT HEART CATHETERIZATION WITH CORONARY ANGIOGRAM N/A 03/12/2011   Procedure: LEFT HEART CATHETERIZATION WITH CORONARY ANGIOGRAM;  Surgeon: Leonie Man, MD;  Location[facility] CATH LAB;  Service: Cardiovascular;  Laterality: N/A;  . NM MYOVIEW LTD  September 2015   EF 67%,OINTERMEDIATE RISK - Inferior Ischemia c/w known anatomy (RCA CAD that is not PCI amenable)     . PERCUTANEOUS CORONARY STENT INTERVENTION (PCI-S) N/A 03/16/2011   Procedure: PERCUTANEOUS CORONARY STENT INTERVENTION (PCI-S);  Surgeon: Troy Sine, MD;  Location: [facility] CATH LAB;  Service: Cardiovascular;  Laterality:  N/A;   Current Meds  Medication Sig  . ACCU-CHEK COMPACT PLUS test strip   . amitriptyline (ELAVIL) 25 MG tablet Take 25 mg by mouth at bedtime.  Marland Kitchen atorvastatin (LIPITOR) 40 MG tablet Take 1 tablet (40 mg total) by mouth at bedtime.  . B-D INS SYR ULTRAFINE 1CC/31G 31G X 5/16" 1 ML MISC Use as directed  . BRILINTA 90 MG TABS tablet TAKE 1 TABLET BY MOUTH TWICE DAILY  . calcitRIOL (ROCALTROL) 0.25 MCG capsule Take 0.25 mcg by mouth every other day.  . cephALEXin (KEFLEX) 250 MG capsule TAKE 1 CAPSULE BY MOUTH 2 TIMES DAILY FOR 10 DAYS  . Dulaglutide 0.75 MG/0.5ML SOPN Inject 1.5 mg into the skin once a week.   . fenofibrate 54 MG tablet take 1 tablet by mouth once daily  . furosemide (LASIX) 40 MG tablet Take 40 mg by mouth as directed.  . insulin NPH (HUMULIN N,NOVOLIN N) 100 UNIT/ML injection Inject into the skin. 60 units in morning,40 units in evening  . insulin regular (NOVOLIN R,HUMULIN R) 100 units/mL injection Inject into the skin. Pt take 40 units in the morning 30 units  at lunch and 30 units in the evening  . isosorbide mononitrate (IMDUR) 60 MG 24 hr tablet TAKE 1 AND 1/2 TABLETS BY MOUTH ONCE DAILY  . losartan (COZAAR) 50 MG tablet Take 50 mg by mouth daily.   . metoprolol tartrate (LOPRESSOR) 25 MG tablet TAKE 1 TABLET BY MOUTH TWICE A DAY  . pantoprazole (PROTONIX) 40 MG tablet TAKE 1 TABLET BY MOUTH ONCE DAILY  . POLY-IRON 150 150 MG capsule TAKE 1 CAPSULE(150 MG) BY MOUTH EVERY OTHER DAY  Danielle [name] takes Lasix 40 mg & 20 mg alternating just about every other day.   Allergies  Allergen Reactions  . Lisinopril Cough    Social History   Social History  . Marital status: Married    Spouse name: N/A  . Number of children: N/A  . Years of education: N/A   Social History Main Topics  . Smoking status: Never Smoker  . Smokeless tobacco: Never Used  . Alcohol use No  . Drug use: No  . Sexual activity: No   Other Topics Concern  . None   Social History Narrative   Danielle [name]  is a married, mother of 31 with 6 grandchildren.  Danielle [name] does not smoke or drink.  Danielle [name] is essentially sedentary, does not do much at all besides around the house.  Danielle [name] does range of motion exercises for a few minutes in the morning, but does not get routine walking or aerobic exercise.  Danielle [name] speaks no [place], but her daughter is essentially her primary care provider both in the house as well as for clinic visits.  Her husband died 01-24-2015 after a struggle with cancer.  Family History  Problem Relation Age of Onset  . Family history unknown: Yes     Wt Readings from Last 3 Encounters:  01/22/17 189 lb 3.2 oz (85.8 kg)  09/14/16 185 lb (83.9 kg)  01/30/16 185 lb 9.6 oz (84.2 kg)    PHYSICAL EXAM BP (!) 106/40   Pulse 82   Ht 4\' 11"  (1.499 m)   Wt 189 lb 3.2 oz (85.8 kg)   BMI 38.21 kg/m   Physical Exam  Constitutional: Danielle [name] is oriented to person, place, and time. Danielle [name] appears well-developed and well-nourished. No distress.  Well-groomed. Morbidly obese  HENT:  Head: Normocephalic and atraumatic.  Eyes: EOM are normal.  Neck: No hepatojugular reflux and no JVD (Difficult to assess due to body habitus) present. Carotid bruit is not present.  Cardiovascular: Normal rate, regular rhythm and normal pulses.   No extrasystoles are present. PMI is not displaced.  Exam reveals distant heart sounds. Exam reveals no gallop.   Murmur (Soft 1/6 SEM heard loudest at the left sternal border) heard. Pulmonary/Chest: Effort normal and breath sounds normal. No respiratory distress. Danielle [name] has no wheezes. Danielle [name] has no rales.  Abdominal: Soft. Bowel sounds are normal. Danielle [name] exhibits no distension. There is no tenderness. There is no rebound.  Truncal obesity  Musculoskeletal: Normal range of motion. Danielle [name] exhibits no edema.  Slow somewhat shuffling gait using a rolling walker  Neurological: Danielle [name] is alert and oriented to person, place, and time.  Skin: Skin is warm and dry. No rash noted. No erythema.  Psychiatric:  Danielle [name] has a normal mood and affect. Her behavior is normal.  Cannot really determine her thought content or judgment based on language barrier. Funny - quick responses to long questions - mostly No.   Nursing note and vitals reviewed.    Adult ECG Report  Rate: 82;  Rhythm: normal sinus rhythm and Normal axis, intervals and durations. Nonspecific ST and T-wave changes.;   Narrative Interpretation: stable EKG. No notable change.   Other studies Reviewed: Additional studies/ records that were reviewed today include:  Recent Labs:  Followed by PCP & Dr. Chalmers Cater (Endo)  ASSESSMENT / PLAN: Problem List Items Addressed This Visit    CAD (coronary artery disease), CFX (x2) and LAD PCI in setting of non-STEMI and acute diastolic heart failure - Primary (Chronic)    Stable with no recurrent angina symptoms.  Danielle [name] never really did have true anginal chest discomfort - mostly had CHF symptoms when Danielle [name] had her NSTEMI.   Danielle [name] has diffusely diseased RCA with no good PCI option, but has stents in the LAD & Cx distribution.  Plan: NO change from current regimen  Brilinta without aspirin.  Stable dose of Imdur, losartan and Lopressor  Stable dose of atorvastatin      Relevant Medications   furosemide (LASIX) 40 MG tablet   Other Relevant Orders   EKG 12-Lead (Completed)   Chronic diastolic CHF (congestive heart failure), NYHA class 2 (HCC) (Chronic)    Stable with no active heart failure symptoms of PND, orthopnea or edema. Danielle [name] does have some exertional dyspnea. Mostly diastolic dysfunction mediated. Danielle [name] remains on stable dose of furosemide which Danielle [name] alternates taking 40 mg some days and 20 mg other days. Danielle [name] is on what actually from baseline was reduced dose of ARB and beta blocker. Blood pressure is currently probably the best that it has been controlled.      Relevant Medications   furosemide (LASIX) 40 MG tablet   Other Relevant Orders   EKG 12-Lead (Completed)   Dyslipidemia, goal LDL below  70 (Chronic)    Remains on stable dose of atorvastatin plus fenofibrate. Labs are being followed by Dr. Chalmers Cater who is also following her DM-2. No labs available.      Relevant Medications   furosemide (LASIX) 40 MG tablet   Other Relevant Orders   EKG 12-Lead (Completed)   Essential hypertension (Chronic)    Blood pressure looks excellent. No change to current medications      Relevant Medications   furosemide (LASIX) 40 MG tablet   Other Relevant Orders   EKG 12-Lead (Completed)   History of: Non-ST elevated myocardial infarction (non-STEMI) - inferior lateral ST depression, peak troponin 16 (Chronic)    Distant history of non-ST elevation MI/mostly inferior inferolateral. Presentation was acute CHF and not necessarily true angina. Ever since we adjusted her diuretic dosing, Danielle [name] has been doing well with no further history of significant CHF or anginal symptoms.  We did do a stress test that showed some inferior ischemia without infarct which likely relates to her RCA. In the absence of active symptoms, I think we would simply treat medically. For now, no plans to reinvestigate with either invasive or noninvasive studies      Relevant Medications   furosemide (LASIX) 40 MG tablet   Other Relevant Orders   EKG 12-Lead (Completed)   Obesity (BMI 30-39.9) (Chronic)    Unlikely that Danielle [name] will lose any more weight now. In fact Danielle [name] gained back some of the weight that Danielle [name] lost last year. Danielle [name] still has significant truncal obesity, and is unlikely to lose much more less Danielle [name] has another drop in her dietary intake. Danielle [name] simply cannot do more than 1 or 2 minutes of exercise at a time either because Danielle [name] doesn't want to or because it hurts.  Relevant Orders   EKG 12-Lead (Completed)   PVD (peripheral vascular disease) (HCC) (Chronic)    No claudication symptoms. Danielle [name] had left carotid disease in the past. Don't hear a bruit. Continue to monitor for signs of diabetic foot ulcers etc. Danielle [name] and her  daughter deny any lesions or wounds concern.       Relevant Medications   furosemide (LASIX) 40 MG tablet      Current medicines are reviewed at length with the patient today. (+/- concerns) NONE The following changes have been made: NONE  Patient Instructions  NO CHANGE WITH CURRENT MEDICATIONS     Your physician wants you to follow-up in [place]. You will receive a reminder letter in the mail two months in advance. If you don't receive a letter, please call our office to schedule the follow-up appointment.   If you need a refill on your cardiac medications before your next appointment, please call your pharmacy.  Studies Ordered:   Orders Placed This Encounter  Procedures  . EKG 12-Lead     Glenetta Hew, M.D., M.S. Interventional Cardiologist   Pager # [phone] Phone # [phone] [address]. [place], [place] [address]

## 2017-01-23 ENCOUNTER — Encounter: Payer: Self-pay | Admitting: Cardiology

## 2017-01-23 NOTE — Assessment & Plan Note (Signed)
Blood pressure looks excellent. No change to current medications

## 2017-01-23 NOTE — Assessment & Plan Note (Signed)
Unlikely that she will lose any more weight now. In fact she gained back some of the weight that she lost last year. She still has significant truncal obesity, and is unlikely to lose much more less she has another drop in her dietary intake. She simply cannot do more than 1 or 2 minutes of exercise at a time either because she doesn't want to or because it hurts.

## 2017-01-23 NOTE — Assessment & Plan Note (Signed)
Stable with no recurrent angina symptoms.  She never really did have true anginal chest discomfort - mostly had CHF symptoms when she had her NSTEMI.   She has diffusely diseased RCA with no good PCI option, but has stents in the LAD & Cx distribution.  Plan: NO change from current regimen  Brilinta without aspirin.  Stable dose of Imdur, losartan and Lopressor  Stable dose of atorvastatin

## 2017-01-23 NOTE — Assessment & Plan Note (Signed)
Remains on stable dose of atorvastatin plus fenofibrate. Labs are being followed by Dr. Chalmers Cater who is also following her DM-2. No labs available.

## 2017-01-23 NOTE — Assessment & Plan Note (Signed)
Distant history of non-ST elevation MI/mostly inferior inferolateral. Presentation was acute CHF and not necessarily true angina. Ever since we adjusted her diuretic dosing, she has been doing well with no further history of significant CHF or anginal symptoms.  We did do a stress test that showed some inferior ischemia without infarct which likely relates to her RCA. In the absence of active symptoms, I think we would simply treat medically. For now, no plans to reinvestigate with either invasive or noninvasive studies

## 2017-01-23 NOTE — Assessment & Plan Note (Signed)
No claudication symptoms. She had left carotid disease in the past. Don't hear a bruit. Continue to monitor for signs of diabetic foot ulcers etc. She and her daughter deny any lesions or wounds concern.

## 2017-01-23 NOTE — Assessment & Plan Note (Signed)
Stable with no active heart failure symptoms of PND, orthopnea or edema. She does have some exertional dyspnea. Mostly diastolic dysfunction mediated. She remains on stable dose of furosemide which she alternates taking 40 mg some days and 20 mg other days. She is on what actually from baseline was reduced dose of ARB and beta blocker. Blood pressure is currently probably the best that it has been controlled.

## 2017-01-24 ENCOUNTER — Other Ambulatory Visit: Payer: Self-pay | Admitting: Cardiology

## 2017-03-02 ENCOUNTER — Other Ambulatory Visit: Payer: Self-pay | Admitting: Cardiology

## 2017-03-22 ENCOUNTER — Other Ambulatory Visit: Payer: Self-pay | Admitting: *Deleted

## 2017-03-22 MED ORDER — PANTOPRAZOLE SODIUM 40 MG PO TBEC: 40.0000 mg | DELAYED_RELEASE_TABLET | Freq: Every day | ORAL | 12 refills | Status: DC

## 2017-04-25 ENCOUNTER — Other Ambulatory Visit: Payer: Self-pay | Admitting: *Deleted

## 2017-04-25 MED ORDER — ISOSORBIDE MONONITRATE ER 60 MG PO TB24: 90.0000 mg | ORAL_TABLET | Freq: Every day | ORAL | 3 refills | Status: DC

## 2017-04-25 NOTE — Telephone Encounter (Signed)
REFILL 

## 2017-04-26 ENCOUNTER — Other Ambulatory Visit: Payer: Self-pay | Admitting: *Deleted

## 2017-04-26 MED ORDER — ISOSORBIDE MONONITRATE ER 60 MG PO TB24: 90.0000 mg | ORAL_TABLET | Freq: Every day | ORAL | 3 refills | Status: DC

## 2017-05-08 ENCOUNTER — Encounter: Payer: Self-pay | Admitting: Podiatry

## 2017-05-08 ENCOUNTER — Ambulatory Visit: Payer: Medicare Other | Admitting: Podiatry

## 2017-05-08 DIAGNOSIS — M79672 Pain in left foot: Secondary | ICD-10-CM | POA: Diagnosis not present

## 2017-05-08 DIAGNOSIS — I639 Cerebral infarction, unspecified: Secondary | ICD-10-CM | POA: Diagnosis not present

## 2017-05-08 DIAGNOSIS — M79662 Pain in left lower leg: Secondary | ICD-10-CM | POA: Diagnosis not present

## 2017-05-08 DIAGNOSIS — M7662 Achilles tendinitis, left leg: Secondary | ICD-10-CM

## 2017-05-08 DIAGNOSIS — E1165 Type 2 diabetes mellitus with hyperglycemia: Secondary | ICD-10-CM | POA: Diagnosis not present

## 2017-05-08 DIAGNOSIS — R2681 Unsteadiness on feet: Secondary | ICD-10-CM | POA: Diagnosis not present

## 2017-05-08 MED ORDER — TRIAMCINOLONE ACETONIDE 10 MG/ML IJ SUSP
10.0000 mg | Freq: Once | INTRAMUSCULAR | Status: AC
  Administered 2017-05-08: 10 mg

## 2017-05-08 NOTE — Progress Notes (Signed)
Subjective:   Patient ID: Danielle [name], female   DOB: 83 y.o.   MRN: [id number]   HPI Patient presents with a lot of pain in the back of the left heel of 3 weeks duration.  She has mild edema in her foot and ankle but her caregiver state that is normal   ROS      Objective:  Physical Exam  Neurovascular status unchanged from previous visit with inflammation of the posterior heel mostly on the medial side of the Achilles at its insertion into the calcaneus with no breakdown of tissue a had a patient who is not a good communicator     Assessment:  Appears to be acute Achilles tendinitis left with no current indication of other condition even though I cannot rule out systemic inflammatory condition     Plan:  Reviewed the x-rays they brought indicating spur and due to the persistent discomfort I do recommend a careful injection explaining chances for rupture associated with injection.  They want to do this and I did a medial injection 3 mg dexamethasone Kenalog careful to keep it away from the central lateral portion of the tendon advised on reduced activity and if symptoms persist will get blood work and may also need to look at other pathology possible with patient's sugar not in good control with current time.  They also watch the sugar more carefully after the steroid injection and are encouraged to call me with any questions

## 2017-05-14 ENCOUNTER — Encounter: Payer: Self-pay | Admitting: Podiatry

## 2017-05-14 ENCOUNTER — Telehealth: Payer: Self-pay | Admitting: *Deleted

## 2017-05-14 NOTE — Telephone Encounter (Signed)
Pt's dtr, Manson Allan states they saw Dr. Paulla Dolly about 1 1/2 weeks ago and they want to see another doctor in different office, and she needs the notes sent to the new doctor's office.

## 2017-05-14 NOTE — Telephone Encounter (Signed)
Called and spoke with pt's daughter. I told her in order for [place] to release the medical records she needs to fill out and have her mom sign a release form authorizing [place] to release the records to the other physicians office. Pt's daughter stated she is unable to leave the house as she is the sole care taker of her mother. I told her I could mail a form and put an envelope in there to mail it back to me and then I could release her records. Pt's daughter then requested I call her and let her know when I send the records to Dr. Doran Durand.

## 2017-05-14 NOTE — Progress Notes (Signed)
Medical records release form with stamped return envelope were put up front to be mailed out to pt for pt to fill out and sign and mail back to me to have her medical records released.

## 2017-05-21 DIAGNOSIS — S92002A Unspecified fracture of left calcaneus, initial encounter for closed fracture: Secondary | ICD-10-CM | POA: Diagnosis not present

## 2017-05-21 DIAGNOSIS — M79672 Pain in left foot: Secondary | ICD-10-CM | POA: Diagnosis not present

## 2017-05-22 DIAGNOSIS — G589 Mononeuropathy, unspecified: Secondary | ICD-10-CM | POA: Diagnosis not present

## 2017-05-22 DIAGNOSIS — I10 Essential (primary) hypertension: Secondary | ICD-10-CM | POA: Diagnosis not present

## 2017-05-28 ENCOUNTER — Other Ambulatory Visit: Payer: Self-pay | Admitting: Pharmacist

## 2017-05-30 ENCOUNTER — Other Ambulatory Visit: Payer: Self-pay | Admitting: Cardiology

## 2017-05-30 NOTE — Telephone Encounter (Signed)
REFILL 

## 2017-06-13 ENCOUNTER — Other Ambulatory Visit: Payer: Self-pay | Admitting: Cardiology

## 2017-06-13 NOTE — Telephone Encounter (Signed)
REFILL 

## 2017-06-19 DIAGNOSIS — S92002A Unspecified fracture of left calcaneus, initial encounter for closed fracture: Secondary | ICD-10-CM | POA: Diagnosis not present

## 2017-06-19 DIAGNOSIS — G589 Mononeuropathy, unspecified: Secondary | ICD-10-CM | POA: Diagnosis not present

## 2017-06-19 DIAGNOSIS — M79641 Pain in right hand: Secondary | ICD-10-CM | POA: Diagnosis not present

## 2017-06-19 DIAGNOSIS — S62244A Nondisplaced fracture of shaft of first metacarpal bone, right hand, initial encounter for closed fracture: Secondary | ICD-10-CM | POA: Diagnosis not present

## 2017-06-19 DIAGNOSIS — M79672 Pain in left foot: Secondary | ICD-10-CM | POA: Diagnosis not present

## 2017-06-19 DIAGNOSIS — I10 Essential (primary) hypertension: Secondary | ICD-10-CM | POA: Diagnosis not present

## 2017-07-01 DIAGNOSIS — M7989 Other specified soft tissue disorders: Secondary | ICD-10-CM | POA: Diagnosis not present

## 2017-07-01 DIAGNOSIS — S62244D Nondisplaced fracture of shaft of first metacarpal bone, right hand, subsequent encounter for fracture with routine healing: Secondary | ICD-10-CM | POA: Diagnosis not present

## 2017-07-01 DIAGNOSIS — L02519 Cutaneous abscess of unspecified hand: Secondary | ICD-10-CM | POA: Diagnosis not present

## 2017-07-02 DIAGNOSIS — N390 Urinary tract infection, site not specified: Secondary | ICD-10-CM | POA: Diagnosis not present

## 2017-07-02 DIAGNOSIS — R319 Hematuria, unspecified: Secondary | ICD-10-CM | POA: Diagnosis not present

## 2017-07-04 DIAGNOSIS — M109 Gout, unspecified: Secondary | ICD-10-CM | POA: Diagnosis not present

## 2017-07-04 DIAGNOSIS — L02519 Cutaneous abscess of unspecified hand: Secondary | ICD-10-CM | POA: Diagnosis not present

## 2017-07-04 DIAGNOSIS — S62244D Nondisplaced fracture of shaft of first metacarpal bone, right hand, subsequent encounter for fracture with routine healing: Secondary | ICD-10-CM | POA: Diagnosis not present

## 2017-07-04 DIAGNOSIS — M79641 Pain in right hand: Secondary | ICD-10-CM | POA: Diagnosis not present

## 2017-07-11 DIAGNOSIS — S62244D Nondisplaced fracture of shaft of first metacarpal bone, right hand, subsequent encounter for fracture with routine healing: Secondary | ICD-10-CM | POA: Diagnosis not present

## 2017-07-11 DIAGNOSIS — Z4789 Encounter for other orthopedic aftercare: Secondary | ICD-10-CM | POA: Diagnosis not present

## 2017-07-11 DIAGNOSIS — L02519 Cutaneous abscess of unspecified hand: Secondary | ICD-10-CM | POA: Diagnosis not present

## 2017-07-11 DIAGNOSIS — M79641 Pain in right hand: Secondary | ICD-10-CM | POA: Diagnosis not present

## 2017-07-16 DIAGNOSIS — M810 Age-related osteoporosis without current pathological fracture: Secondary | ICD-10-CM | POA: Diagnosis not present

## 2017-07-16 DIAGNOSIS — E7800 Pure hypercholesterolemia, unspecified: Secondary | ICD-10-CM | POA: Diagnosis not present

## 2017-07-16 DIAGNOSIS — I10 Essential (primary) hypertension: Secondary | ICD-10-CM | POA: Diagnosis not present

## 2017-07-16 DIAGNOSIS — E1165 Type 2 diabetes mellitus with hyperglycemia: Secondary | ICD-10-CM | POA: Diagnosis not present

## 2017-07-17 DIAGNOSIS — M79672 Pain in left foot: Secondary | ICD-10-CM | POA: Diagnosis not present

## 2017-07-20 DIAGNOSIS — G589 Mononeuropathy, unspecified: Secondary | ICD-10-CM | POA: Diagnosis not present

## 2017-07-20 DIAGNOSIS — I10 Essential (primary) hypertension: Secondary | ICD-10-CM | POA: Diagnosis not present

## 2017-08-02 DIAGNOSIS — S62244D Nondisplaced fracture of shaft of first metacarpal bone, right hand, subsequent encounter for fracture with routine healing: Secondary | ICD-10-CM | POA: Diagnosis not present

## 2017-08-02 DIAGNOSIS — M79641 Pain in right hand: Secondary | ICD-10-CM | POA: Diagnosis not present

## 2017-08-02 DIAGNOSIS — L02519 Cutaneous abscess of unspecified hand: Secondary | ICD-10-CM | POA: Diagnosis not present

## 2017-08-02 DIAGNOSIS — M109 Gout, unspecified: Secondary | ICD-10-CM | POA: Diagnosis not present

## 2017-08-08 DIAGNOSIS — M810 Age-related osteoporosis without current pathological fracture: Secondary | ICD-10-CM | POA: Diagnosis not present

## 2017-08-19 DIAGNOSIS — G589 Mononeuropathy, unspecified: Secondary | ICD-10-CM | POA: Diagnosis not present

## 2017-08-19 DIAGNOSIS — I10 Essential (primary) hypertension: Secondary | ICD-10-CM | POA: Diagnosis not present

## 2017-09-03 DIAGNOSIS — E1165 Type 2 diabetes mellitus with hyperglycemia: Secondary | ICD-10-CM | POA: Diagnosis not present

## 2017-09-03 DIAGNOSIS — E039 Hypothyroidism, unspecified: Secondary | ICD-10-CM | POA: Diagnosis not present

## 2017-09-03 DIAGNOSIS — E7800 Pure hypercholesterolemia, unspecified: Secondary | ICD-10-CM | POA: Diagnosis not present

## 2017-09-11 DIAGNOSIS — E039 Hypothyroidism, unspecified: Secondary | ICD-10-CM | POA: Diagnosis not present

## 2017-09-11 DIAGNOSIS — M810 Age-related osteoporosis without current pathological fracture: Secondary | ICD-10-CM | POA: Diagnosis not present

## 2017-09-11 DIAGNOSIS — E1165 Type 2 diabetes mellitus with hyperglycemia: Secondary | ICD-10-CM | POA: Diagnosis not present

## 2017-09-11 DIAGNOSIS — E785 Hyperlipidemia, unspecified: Secondary | ICD-10-CM | POA: Diagnosis not present

## 2017-09-11 DIAGNOSIS — I10 Essential (primary) hypertension: Secondary | ICD-10-CM | POA: Diagnosis not present

## 2017-09-11 DIAGNOSIS — E7800 Pure hypercholesterolemia, unspecified: Secondary | ICD-10-CM | POA: Diagnosis not present

## 2017-09-12 DIAGNOSIS — N390 Urinary tract infection, site not specified: Secondary | ICD-10-CM | POA: Diagnosis not present

## 2017-09-19 DIAGNOSIS — I10 Essential (primary) hypertension: Secondary | ICD-10-CM | POA: Diagnosis not present

## 2017-09-19 DIAGNOSIS — G589 Mononeuropathy, unspecified: Secondary | ICD-10-CM | POA: Diagnosis not present

## 2017-10-19 DIAGNOSIS — I10 Essential (primary) hypertension: Secondary | ICD-10-CM | POA: Diagnosis not present

## 2017-10-19 DIAGNOSIS — G589 Mononeuropathy, unspecified: Secondary | ICD-10-CM | POA: Diagnosis not present

## 2017-11-19 DIAGNOSIS — I10 Essential (primary) hypertension: Secondary | ICD-10-CM | POA: Diagnosis not present

## 2017-11-19 DIAGNOSIS — G589 Mononeuropathy, unspecified: Secondary | ICD-10-CM | POA: Diagnosis not present

## 2017-12-05 DIAGNOSIS — H40003 Preglaucoma, unspecified, bilateral: Secondary | ICD-10-CM | POA: Diagnosis not present

## 2017-12-05 DIAGNOSIS — H43822 Vitreomacular adhesion, left eye: Secondary | ICD-10-CM | POA: Diagnosis not present

## 2017-12-05 DIAGNOSIS — H35352 Cystoid macular degeneration, left eye: Secondary | ICD-10-CM | POA: Diagnosis not present

## 2017-12-05 DIAGNOSIS — E113553 Type 2 diabetes mellitus with stable proliferative diabetic retinopathy, bilateral: Secondary | ICD-10-CM | POA: Diagnosis not present

## 2017-12-20 DIAGNOSIS — I10 Essential (primary) hypertension: Secondary | ICD-10-CM | POA: Diagnosis not present

## 2017-12-20 DIAGNOSIS — G589 Mononeuropathy, unspecified: Secondary | ICD-10-CM | POA: Diagnosis not present

## 2018-01-08 DIAGNOSIS — E1165 Type 2 diabetes mellitus with hyperglycemia: Secondary | ICD-10-CM | POA: Diagnosis not present

## 2018-01-08 DIAGNOSIS — Z23 Encounter for immunization: Secondary | ICD-10-CM | POA: Diagnosis not present

## 2018-01-08 DIAGNOSIS — E7800 Pure hypercholesterolemia, unspecified: Secondary | ICD-10-CM | POA: Diagnosis not present

## 2018-01-08 DIAGNOSIS — E039 Hypothyroidism, unspecified: Secondary | ICD-10-CM | POA: Diagnosis not present

## 2018-01-08 DIAGNOSIS — I10 Essential (primary) hypertension: Secondary | ICD-10-CM | POA: Diagnosis not present

## 2018-01-15 ENCOUNTER — Other Ambulatory Visit: Payer: Self-pay | Admitting: Cardiology

## 2018-01-19 DIAGNOSIS — I10 Essential (primary) hypertension: Secondary | ICD-10-CM | POA: Diagnosis not present

## 2018-01-19 DIAGNOSIS — G589 Mononeuropathy, unspecified: Secondary | ICD-10-CM | POA: Diagnosis not present

## 2018-02-13 DIAGNOSIS — M810 Age-related osteoporosis without current pathological fracture: Secondary | ICD-10-CM | POA: Diagnosis not present

## 2018-02-18 DIAGNOSIS — E1165 Type 2 diabetes mellitus with hyperglycemia: Secondary | ICD-10-CM | POA: Diagnosis not present

## 2018-02-18 DIAGNOSIS — E039 Hypothyroidism, unspecified: Secondary | ICD-10-CM | POA: Diagnosis not present

## 2018-02-18 DIAGNOSIS — I10 Essential (primary) hypertension: Secondary | ICD-10-CM | POA: Diagnosis not present

## 2018-02-19 DIAGNOSIS — G589 Mononeuropathy, unspecified: Secondary | ICD-10-CM | POA: Diagnosis not present

## 2018-02-19 DIAGNOSIS — I10 Essential (primary) hypertension: Secondary | ICD-10-CM | POA: Diagnosis not present

## 2018-02-25 DIAGNOSIS — K219 Gastro-esophageal reflux disease without esophagitis: Secondary | ICD-10-CM | POA: Diagnosis not present

## 2018-02-25 DIAGNOSIS — E7800 Pure hypercholesterolemia, unspecified: Secondary | ICD-10-CM | POA: Diagnosis not present

## 2018-02-25 DIAGNOSIS — I10 Essential (primary) hypertension: Secondary | ICD-10-CM | POA: Diagnosis not present

## 2018-02-25 DIAGNOSIS — E1165 Type 2 diabetes mellitus with hyperglycemia: Secondary | ICD-10-CM | POA: Diagnosis not present

## 2018-02-25 DIAGNOSIS — N184 Chronic kidney disease, stage 4 (severe): Secondary | ICD-10-CM | POA: Diagnosis not present

## 2018-04-15 ENCOUNTER — Other Ambulatory Visit: Payer: Self-pay | Admitting: Cardiology

## 2018-04-17 ENCOUNTER — Other Ambulatory Visit: Payer: Self-pay | Admitting: Cardiology

## 2018-05-12 DIAGNOSIS — E7800 Pure hypercholesterolemia, unspecified: Secondary | ICD-10-CM | POA: Diagnosis not present

## 2018-05-12 DIAGNOSIS — E1165 Type 2 diabetes mellitus with hyperglycemia: Secondary | ICD-10-CM | POA: Diagnosis not present

## 2018-05-12 DIAGNOSIS — R809 Proteinuria, unspecified: Secondary | ICD-10-CM | POA: Diagnosis not present

## 2018-05-12 DIAGNOSIS — I10 Essential (primary) hypertension: Secondary | ICD-10-CM | POA: Diagnosis not present

## 2018-05-23 ENCOUNTER — Other Ambulatory Visit: Payer: Self-pay | Admitting: Cardiology

## 2018-06-12 ENCOUNTER — Other Ambulatory Visit: Payer: Self-pay | Admitting: Cardiology

## 2018-06-13 ENCOUNTER — Other Ambulatory Visit: Payer: Self-pay | Admitting: Cardiology

## 2018-06-19 DIAGNOSIS — E039 Hypothyroidism, unspecified: Secondary | ICD-10-CM | POA: Diagnosis not present

## 2018-06-19 DIAGNOSIS — I10 Essential (primary) hypertension: Secondary | ICD-10-CM | POA: Diagnosis not present

## 2018-06-19 DIAGNOSIS — E1165 Type 2 diabetes mellitus with hyperglycemia: Secondary | ICD-10-CM | POA: Diagnosis not present

## 2018-06-24 ENCOUNTER — Other Ambulatory Visit: Payer: Self-pay | Admitting: Cardiology

## 2018-07-03 ENCOUNTER — Other Ambulatory Visit: Payer: Self-pay | Admitting: Cardiology

## 2018-07-26 ENCOUNTER — Other Ambulatory Visit: Payer: Self-pay | Admitting: Cardiology

## 2018-07-28 NOTE — Telephone Encounter (Signed)
Pantoprazole 40 mg refilled.

## 2018-08-18 DIAGNOSIS — M810 Age-related osteoporosis without current pathological fracture: Secondary | ICD-10-CM | POA: Diagnosis not present

## 2018-08-30 ENCOUNTER — Other Ambulatory Visit: Payer: Self-pay | Admitting: Cardiology

## 2018-09-02 ENCOUNTER — Other Ambulatory Visit: Payer: Self-pay | Admitting: Cardiology

## 2018-09-10 ENCOUNTER — Telehealth: Payer: Self-pay | Admitting: Cardiology

## 2018-09-10 NOTE — Telephone Encounter (Signed)
error 

## 2018-09-12 ENCOUNTER — Other Ambulatory Visit: Payer: Self-pay | Admitting: Cardiology

## 2018-09-13 ENCOUNTER — Other Ambulatory Visit: Payer: Self-pay | Admitting: Cardiology

## 2018-09-15 ENCOUNTER — Other Ambulatory Visit: Payer: Self-pay | Admitting: Cardiology

## 2018-09-25 ENCOUNTER — Other Ambulatory Visit: Payer: Self-pay | Admitting: Cardiology

## 2018-10-11 ENCOUNTER — Other Ambulatory Visit: Payer: Self-pay | Admitting: Cardiology

## 2018-10-15 ENCOUNTER — Other Ambulatory Visit: Payer: Self-pay

## 2018-10-15 MED ORDER — TICAGRELOR 90 MG PO TABS: 90.0000 mg | ORAL_TABLET | Freq: Two times a day (BID) | ORAL | 0 refills | Status: DC

## 2018-10-16 ENCOUNTER — Telehealth: Payer: Self-pay | Admitting: Cardiology

## 2018-10-16 NOTE — Telephone Encounter (Signed)
New Message      *STAT* If patient is at the pharmacy, call can be transferred to refill team.   1. Which medications need to be refilled? (please list name of each medication and dose if known) Metoprolol Trartrate 25mg    2. Which pharmacy/location (including street and city if local pharmacy) is medication to be sent to? [facility] on [address] and [address]   3. Do they need a 30 day or 90 day supply? 90 day supply

## 2018-10-17 MED ORDER — METOPROLOL TARTRATE 25 MG PO TABS: 25.0000 mg | ORAL_TABLET | Freq: Two times a day (BID) | ORAL | 0 refills | Status: DC

## 2018-10-17 NOTE — Telephone Encounter (Signed)
Rx(s) sent to pharmacy electronically.  

## 2018-11-10 DIAGNOSIS — E7800 Pure hypercholesterolemia, unspecified: Secondary | ICD-10-CM | POA: Diagnosis not present

## 2018-11-10 DIAGNOSIS — E1165 Type 2 diabetes mellitus with hyperglycemia: Secondary | ICD-10-CM | POA: Diagnosis not present

## 2018-11-10 DIAGNOSIS — I10 Essential (primary) hypertension: Secondary | ICD-10-CM | POA: Diagnosis not present

## 2018-11-10 DIAGNOSIS — R809 Proteinuria, unspecified: Secondary | ICD-10-CM | POA: Diagnosis not present

## 2018-11-12 ENCOUNTER — Other Ambulatory Visit: Payer: Self-pay | Admitting: Cardiology

## 2018-11-27 DIAGNOSIS — N184 Chronic kidney disease, stage 4 (severe): Secondary | ICD-10-CM | POA: Diagnosis not present

## 2018-11-27 DIAGNOSIS — E7800 Pure hypercholesterolemia, unspecified: Secondary | ICD-10-CM | POA: Diagnosis not present

## 2018-11-27 DIAGNOSIS — E1165 Type 2 diabetes mellitus with hyperglycemia: Secondary | ICD-10-CM | POA: Diagnosis not present

## 2018-12-01 DIAGNOSIS — I10 Essential (primary) hypertension: Secondary | ICD-10-CM | POA: Diagnosis not present

## 2018-12-01 DIAGNOSIS — E1165 Type 2 diabetes mellitus with hyperglycemia: Secondary | ICD-10-CM | POA: Diagnosis not present

## 2018-12-01 DIAGNOSIS — L0390 Cellulitis, unspecified: Secondary | ICD-10-CM | POA: Diagnosis not present

## 2018-12-01 DIAGNOSIS — E875 Hyperkalemia: Secondary | ICD-10-CM | POA: Diagnosis not present

## 2018-12-12 ENCOUNTER — Other Ambulatory Visit: Payer: Self-pay | Admitting: Cardiology

## 2019-01-05 ENCOUNTER — Ambulatory Visit: Payer: Medicare Other | Admitting: Cardiology

## 2019-01-12 ENCOUNTER — Other Ambulatory Visit: Payer: Self-pay | Admitting: Cardiology

## 2019-01-13 ENCOUNTER — Ambulatory Visit: Payer: Medicare Other | Admitting: Cardiology

## 2019-01-14 ENCOUNTER — Other Ambulatory Visit: Payer: Self-pay | Admitting: Cardiology

## 2019-01-14 NOTE — Telephone Encounter (Signed)
Please advise if ok to refill Metoprolol tartrate 25 mg bid. Pt last seen 01/2017 has upcoming appointment 02/16/19 with Ellyn Hack. Pt has hx of cancelling appointments.

## 2019-01-14 NOTE — Telephone Encounter (Signed)
Rx request sent to pharmacy.  

## 2019-01-15 NOTE — Telephone Encounter (Signed)
OKAY TO REFILL , OFFICE  RESCHEDULE APPOINTMENT

## 2019-02-13 ENCOUNTER — Other Ambulatory Visit: Payer: Self-pay | Admitting: Cardiology

## 2019-02-13 NOTE — Telephone Encounter (Signed)
Rx(s) sent to pharmacy electronically.  

## 2019-02-14 ENCOUNTER — Emergency Department (HOSPITAL_COMMUNITY): Payer: Medicare Other

## 2019-02-14 ENCOUNTER — Inpatient Hospital Stay (HOSPITAL_COMMUNITY)
Admission: EM | Admit: 2019-02-14 | Discharge: 2019-02-21 | DRG: 480 | Disposition: A | Discharge status: Home Health | Payer: Medicare Other | Attending: Internal Medicine | Admitting: Internal Medicine

## 2019-02-14 ENCOUNTER — Encounter (HOSPITAL_COMMUNITY): Payer: Self-pay

## 2019-02-14 ENCOUNTER — Inpatient Hospital Stay (HOSPITAL_COMMUNITY): Payer: Medicare Other

## 2019-02-14 ENCOUNTER — Other Ambulatory Visit: Payer: Self-pay

## 2019-02-14 DIAGNOSIS — R739 Hyperglycemia, unspecified: Secondary | ICD-10-CM

## 2019-02-14 DIAGNOSIS — N184 Chronic kidney disease, stage 4 (severe): Secondary | ICD-10-CM | POA: Diagnosis present

## 2019-02-14 DIAGNOSIS — N179 Acute kidney failure, unspecified: Secondary | ICD-10-CM | POA: Diagnosis not present

## 2019-02-14 DIAGNOSIS — Z794 Long term (current) use of insulin: Secondary | ICD-10-CM | POA: Diagnosis not present

## 2019-02-14 DIAGNOSIS — Z03818 Encounter for observation for suspected exposure to other biological agents ruled out: Secondary | ICD-10-CM | POA: Diagnosis not present

## 2019-02-14 DIAGNOSIS — I130 Hypertensive heart and chronic kidney disease with heart failure and stage 1 through stage 4 chronic kidney disease, or unspecified chronic kidney disease: Secondary | ICD-10-CM | POA: Diagnosis present

## 2019-02-14 DIAGNOSIS — E1151 Type 2 diabetes mellitus with diabetic peripheral angiopathy without gangrene: Secondary | ICD-10-CM | POA: Diagnosis not present

## 2019-02-14 DIAGNOSIS — I272 Pulmonary hypertension, unspecified: Secondary | ICD-10-CM | POA: Diagnosis not present

## 2019-02-14 DIAGNOSIS — I2510 Atherosclerotic heart disease of native coronary artery without angina pectoris: Secondary | ICD-10-CM | POA: Diagnosis not present

## 2019-02-14 DIAGNOSIS — N183 Chronic kidney disease, stage 3 unspecified: Secondary | ICD-10-CM | POA: Diagnosis not present

## 2019-02-14 DIAGNOSIS — Z6841 Body Mass Index (BMI) 40.0 and over, adult: Secondary | ICD-10-CM

## 2019-02-14 DIAGNOSIS — Z20828 Contact with and (suspected) exposure to other viral communicable diseases: Secondary | ICD-10-CM | POA: Diagnosis present

## 2019-02-14 DIAGNOSIS — M1711 Unilateral primary osteoarthritis, right knee: Secondary | ICD-10-CM | POA: Diagnosis not present

## 2019-02-14 DIAGNOSIS — S72141A Displaced intertrochanteric fracture of right femur, initial encounter for closed fracture: Secondary | ICD-10-CM

## 2019-02-14 DIAGNOSIS — Z955 Presence of coronary angioplasty implant and graft: Secondary | ICD-10-CM | POA: Diagnosis not present

## 2019-02-14 DIAGNOSIS — E1165 Type 2 diabetes mellitus with hyperglycemia: Secondary | ICD-10-CM | POA: Diagnosis present

## 2019-02-14 DIAGNOSIS — I469 Cardiac arrest, cause unspecified: Secondary | ICD-10-CM | POA: Diagnosis not present

## 2019-02-14 DIAGNOSIS — R918 Other nonspecific abnormal finding of lung field: Secondary | ICD-10-CM | POA: Diagnosis not present

## 2019-02-14 DIAGNOSIS — I252 Old myocardial infarction: Secondary | ICD-10-CM

## 2019-02-14 DIAGNOSIS — I129 Hypertensive chronic kidney disease with stage 1 through stage 4 chronic kidney disease, or unspecified chronic kidney disease: Secondary | ICD-10-CM | POA: Diagnosis not present

## 2019-02-14 DIAGNOSIS — R339 Retention of urine, unspecified: Secondary | ICD-10-CM | POA: Diagnosis present

## 2019-02-14 DIAGNOSIS — W19XXXA Unspecified fall, initial encounter: Secondary | ICD-10-CM | POA: Diagnosis not present

## 2019-02-14 DIAGNOSIS — S72001A Fracture of unspecified part of neck of right femur, initial encounter for closed fracture: Secondary | ICD-10-CM | POA: Diagnosis not present

## 2019-02-14 DIAGNOSIS — E785 Hyperlipidemia, unspecified: Secondary | ICD-10-CM | POA: Diagnosis not present

## 2019-02-14 DIAGNOSIS — S72144A Nondisplaced intertrochanteric fracture of right femur, initial encounter for closed fracture: Principal | ICD-10-CM

## 2019-02-14 DIAGNOSIS — M2550 Pain in unspecified joint: Secondary | ICD-10-CM | POA: Diagnosis not present

## 2019-02-14 DIAGNOSIS — R0902 Hypoxemia: Secondary | ICD-10-CM | POA: Diagnosis not present

## 2019-02-14 DIAGNOSIS — Z888 Allergy status to other drugs, medicaments and biological substances status: Secondary | ICD-10-CM | POA: Diagnosis not present

## 2019-02-14 DIAGNOSIS — I5033 Acute on chronic diastolic (congestive) heart failure: Secondary | ICD-10-CM | POA: Diagnosis present

## 2019-02-14 DIAGNOSIS — E1122 Type 2 diabetes mellitus with diabetic chronic kidney disease: Secondary | ICD-10-CM | POA: Diagnosis present

## 2019-02-14 DIAGNOSIS — I5032 Chronic diastolic (congestive) heart failure: Secondary | ICD-10-CM | POA: Diagnosis not present

## 2019-02-14 DIAGNOSIS — I69354 Hemiplegia and hemiparesis following cerebral infarction affecting left non-dominant side: Secondary | ICD-10-CM | POA: Diagnosis not present

## 2019-02-14 DIAGNOSIS — Z01810 Encounter for preprocedural cardiovascular examination: Secondary | ICD-10-CM | POA: Diagnosis not present

## 2019-02-14 DIAGNOSIS — E860 Dehydration: Secondary | ICD-10-CM

## 2019-02-14 DIAGNOSIS — E119 Type 2 diabetes mellitus without complications: Secondary | ICD-10-CM

## 2019-02-14 DIAGNOSIS — M19011 Primary osteoarthritis, right shoulder: Secondary | ICD-10-CM | POA: Diagnosis not present

## 2019-02-14 DIAGNOSIS — N049 Nephrotic syndrome with unspecified morphologic changes: Secondary | ICD-10-CM | POA: Diagnosis not present

## 2019-02-14 DIAGNOSIS — R52 Pain, unspecified: Secondary | ICD-10-CM | POA: Diagnosis not present

## 2019-02-14 DIAGNOSIS — T83098A Other mechanical complication of other indwelling urethral catheter, initial encounter: Secondary | ICD-10-CM | POA: Diagnosis not present

## 2019-02-14 DIAGNOSIS — S72141D Displaced intertrochanteric fracture of right femur, subsequent encounter for closed fracture with routine healing: Secondary | ICD-10-CM | POA: Diagnosis not present

## 2019-02-14 DIAGNOSIS — Z7401 Bed confinement status: Secondary | ICD-10-CM | POA: Diagnosis not present

## 2019-02-14 DIAGNOSIS — T148XXA Other injury of unspecified body region, initial encounter: Secondary | ICD-10-CM

## 2019-02-14 DIAGNOSIS — W19XXXD Unspecified fall, subsequent encounter: Secondary | ICD-10-CM | POA: Diagnosis not present

## 2019-02-14 DIAGNOSIS — Z79899 Other long term (current) drug therapy: Secondary | ICD-10-CM | POA: Diagnosis not present

## 2019-02-14 DIAGNOSIS — S72009A Fracture of unspecified part of neck of unspecified femur, initial encounter for closed fracture: Secondary | ICD-10-CM | POA: Diagnosis present

## 2019-02-14 DIAGNOSIS — R338 Other retention of urine: Secondary | ICD-10-CM | POA: Diagnosis not present

## 2019-02-14 DIAGNOSIS — R000 Tachycardia, unspecified: Secondary | ICD-10-CM | POA: Diagnosis not present

## 2019-02-14 DIAGNOSIS — W08XXXA Fall from other furniture, initial encounter: Secondary | ICD-10-CM | POA: Diagnosis present

## 2019-02-14 DIAGNOSIS — N189 Chronic kidney disease, unspecified: Secondary | ICD-10-CM | POA: Diagnosis not present

## 2019-02-14 DIAGNOSIS — E8770 Fluid overload, unspecified: Secondary | ICD-10-CM | POA: Diagnosis not present

## 2019-02-14 DIAGNOSIS — I517 Cardiomegaly: Secondary | ICD-10-CM | POA: Diagnosis not present

## 2019-02-14 DIAGNOSIS — R9389 Abnormal findings on diagnostic imaging of other specified body structures: Secondary | ICD-10-CM

## 2019-02-14 DIAGNOSIS — I10 Essential (primary) hypertension: Secondary | ICD-10-CM | POA: Diagnosis not present

## 2019-02-14 DIAGNOSIS — S7291XD Unspecified fracture of right femur, subsequent encounter for closed fracture with routine healing: Secondary | ICD-10-CM | POA: Diagnosis not present

## 2019-02-14 DIAGNOSIS — Z743 Need for continuous supervision: Secondary | ICD-10-CM | POA: Diagnosis not present

## 2019-02-14 DIAGNOSIS — M25511 Pain in right shoulder: Secondary | ICD-10-CM

## 2019-02-14 LAB — URINALYSIS, ROUTINE W REFLEX MICROSCOPIC
Bilirubin Urine: NEGATIVE
Glucose, UA: >=500 mg/dL — AB
Ketones, ur: NEGATIVE mg/dL
Nitrite: NEGATIVE
Protein, ur: 30 mg/dL — AB
Specific Gravity, Urine: 1.014 (ref 1.005–1.030)
pH: 5 (ref 5.0–8.0)

## 2019-02-14 LAB — BASIC METABOLIC PANEL
Anion gap: 13 (ref 5–15)
BUN: 65 mg/dL — ABNORMAL HIGH (ref 8–23)
CO2: 21 mmol/L — ABNORMAL LOW (ref 22–32)
Calcium: 8.8 mg/dL — ABNORMAL LOW (ref 8.9–10.3)
Chloride: 106 mmol/L (ref 98–111)
Creatinine, Ser: 3.35 mg/dL — ABNORMAL HIGH (ref 0.44–1.00)
GFR calc Af Amer: 14 mL/min — ABNORMAL LOW (ref >60)
GFR calc non Af Amer: 12 mL/min — ABNORMAL LOW (ref >60)
Glucose, Bld: 412 mg/dL — ABNORMAL HIGH (ref 70–99)
Potassium: 4.7 mmol/L (ref 3.5–5.1)
Sodium: 140 mmol/L (ref 135–145)

## 2019-02-14 LAB — HEMOGLOBIN A1C
Hgb A1c MFr Bld: 7.5 % — ABNORMAL HIGH (ref 4.8–5.6)
Mean Plasma Glucose: 168.55 mg/dL

## 2019-02-14 LAB — CBG MONITORING, ED
Glucose-Capillary: 401 mg/dL — ABNORMAL HIGH (ref 70–99)
Glucose-Capillary: 387 mg/dL — ABNORMAL HIGH (ref 70–99)

## 2019-02-14 LAB — PROTIME-INR
INR: 1.2 (ref 0.8–1.2)
Prothrombin Time: 14.6 seconds (ref 11.4–15.2)

## 2019-02-14 LAB — CBC
HCT: 31.3 % — ABNORMAL LOW (ref 36.0–46.0)
Hemoglobin: 9.8 g/dL — ABNORMAL LOW (ref 12.0–15.0)
MCH: 30.1 pg (ref 26.0–34.0)
MCHC: 31.3 g/dL (ref 30.0–36.0)
MCV: 96 fL (ref 80.0–100.0)
Platelets: 154 10*3/uL (ref 150–400)
RBC: 3.26 MIL/uL — ABNORMAL LOW (ref 3.87–5.11)
RDW: 14.1 % (ref 11.5–15.5)
WBC: 11.2 10*3/uL — ABNORMAL HIGH (ref 4.0–10.5)
nRBC: 0 % (ref 0.0–0.2)

## 2019-02-14 LAB — TYPE AND SCREEN
ABO/RH(D): O POS
Antibody Screen: NEGATIVE

## 2019-02-14 LAB — GLUCOSE, CAPILLARY: Glucose-Capillary: 391 mg/dL — ABNORMAL HIGH (ref 70–99)

## 2019-02-14 LAB — ABO/RH: ABO/RH(D): O POS

## 2019-02-14 MED ORDER — INSULIN NPH (HUMAN) (ISOPHANE) 100 UNIT/ML ~~LOC~~ SUSP
15.0000 [IU] | Freq: Two times a day (BID) | SUBCUTANEOUS | Status: DC
  Administered 2019-02-14: 21:00:00 15 [IU] via SUBCUTANEOUS
  Filled 2019-02-14: qty 10

## 2019-02-14 MED ORDER — ATORVASTATIN CALCIUM 40 MG PO TABS
40.0000 mg | ORAL_TABLET | Freq: Every day | ORAL | Status: DC
  Administered 2019-02-14 – 2019-02-20 (×7): 40 mg via ORAL
  Filled 2019-02-14 (×7): qty 1

## 2019-02-14 MED ORDER — SODIUM BICARBONATE 650 MG PO TABS
325.0000 mg | ORAL_TABLET | ORAL | Status: DC
  Administered 2019-02-15 – 2019-02-19 (×3): 325 mg via ORAL
  Filled 2019-02-14 (×3): qty 1

## 2019-02-14 MED ORDER — SODIUM CHLORIDE 0.9 % IV SOLN
INTRAVENOUS | Status: DC
  Administered 2019-02-14: 20:00:00 via INTRAVENOUS

## 2019-02-14 MED ORDER — METOPROLOL TARTRATE 25 MG PO TABS
25.0000 mg | ORAL_TABLET | Freq: Two times a day (BID) | ORAL | Status: DC
  Administered 2019-02-14 – 2019-02-21 (×12): 25 mg via ORAL
  Filled 2019-02-14 (×13): qty 1

## 2019-02-14 MED ORDER — INSULIN ASPART 100 UNIT/ML ~~LOC~~ SOLN: 0.0000 [IU] | Freq: Three times a day (TID) | SUBCUTANEOUS | Status: DC

## 2019-02-14 MED ORDER — FENTANYL CITRATE (PF) 100 MCG/2ML IJ SOLN
50.0000 ug | INTRAMUSCULAR | Status: AC | PRN
  Administered 2019-02-14 (×2): 50 ug via INTRAVENOUS
  Filled 2019-02-14 (×2): qty 2

## 2019-02-14 MED ORDER — HEPARIN SODIUM (PORCINE) 5000 UNIT/ML IJ SOLN
5000.0000 [IU] | Freq: Two times a day (BID) | INTRAMUSCULAR | Status: DC
  Administered 2019-02-14 – 2019-02-15 (×2): 5000 [IU] via SUBCUTANEOUS
  Filled 2019-02-14 (×2): qty 1

## 2019-02-14 MED ORDER — INSULIN ASPART 100 UNIT/ML ~~LOC~~ SOLN
0.0000 [IU] | Freq: Every day | SUBCUTANEOUS | Status: DC
  Administered 2019-02-14: 21:00:00 5 [IU] via SUBCUTANEOUS

## 2019-02-14 MED ORDER — ISOSORBIDE MONONITRATE ER 60 MG PO TB24
60.0000 mg | ORAL_TABLET | Freq: Every day | ORAL | Status: DC
  Administered 2019-02-15 – 2019-02-20 (×5): 60 mg via ORAL
  Filled 2019-02-14 (×6): qty 1

## 2019-02-14 MED ORDER — ONDANSETRON HCL 4 MG/2ML IJ SOLN
4.0000 mg | Freq: Once | INTRAMUSCULAR | Status: AC
  Administered 2019-02-14: 17:00:00 4 mg via INTRAVENOUS
  Filled 2019-02-14: qty 2

## 2019-02-14 MED ORDER — CALCITRIOL 0.25 MCG PO CAPS
0.2500 ug | ORAL_CAPSULE | ORAL | Status: DC
  Administered 2019-02-14 – 2019-02-20 (×4): 0.25 ug via ORAL
  Filled 2019-02-14 (×4): qty 1

## 2019-02-14 MED ORDER — SODIUM CHLORIDE 0.9 % IV BOLUS
1000.0000 mL | Freq: Once | INTRAVENOUS | Status: AC
  Administered 2019-02-14: 1000 mL via INTRAVENOUS

## 2019-02-14 MED ORDER — SODIUM CHLORIDE 0.9 % IV SOLN
INTRAVENOUS | Status: DC
  Administered 2019-02-14: 17:00:00 via INTRAVENOUS

## 2019-02-14 MED ORDER — ONDANSETRON HCL 4 MG/2ML IJ SOLN
4.0000 mg | Freq: Four times a day (QID) | INTRAMUSCULAR | Status: DC | PRN
  Administered 2019-02-17: 4 mg via INTRAVENOUS

## 2019-02-14 MED ORDER — DULAGLUTIDE 1.5 MG/0.5ML ~~LOC~~ SOAJ: 1.5000 mg | SUBCUTANEOUS | Status: DC

## 2019-02-14 MED ORDER — SODIUM CHLORIDE 0.9% FLUSH
3.0000 mL | Freq: Once | INTRAVENOUS | Status: AC
  Administered 2019-02-14: 17:00:00 3 mL via INTRAVENOUS

## 2019-02-14 MED ORDER — ONDANSETRON HCL 4 MG PO TABS: 4.0000 mg | ORAL_TABLET | Freq: Four times a day (QID) | ORAL | Status: DC | PRN

## 2019-02-14 MED ORDER — PANTOPRAZOLE SODIUM 40 MG PO TBEC
40.0000 mg | DELAYED_RELEASE_TABLET | Freq: Every day | ORAL | Status: DC
  Administered 2019-02-14 – 2019-02-21 (×8): 40 mg via ORAL
  Filled 2019-02-14 (×8): qty 1

## 2019-02-14 MED ORDER — MORPHINE SULFATE (PF) 2 MG/ML IV SOLN
2.0000 mg | INTRAVENOUS | Status: DC | PRN
  Administered 2019-02-14: 2 mg via INTRAVENOUS
  Filled 2019-02-14: qty 1

## 2019-02-14 MED ORDER — ACETAMINOPHEN 500 MG PO TABS: 1000.0000 mg | ORAL_TABLET | Freq: Four times a day (QID) | ORAL | Status: DC | PRN

## 2019-02-14 MED ORDER — ISOSORBIDE MONONITRATE ER 30 MG PO TB24
30.0000 mg | ORAL_TABLET | Freq: Every day | ORAL | Status: DC
  Administered 2019-02-15 – 2019-02-20 (×6): 30 mg via ORAL
  Filled 2019-02-14 (×7): qty 1

## 2019-02-14 MED ORDER — OXYCODONE HCL 5 MG PO TABS
5.0000 mg | ORAL_TABLET | ORAL | Status: DC | PRN
  Administered 2019-02-14 – 2019-02-21 (×11): 5 mg via ORAL
  Filled 2019-02-14 (×11): qty 1

## 2019-02-14 NOTE — ED Provider Notes (Signed)
[facility] DEPARTMENT Provider Note   CSN: [id number] Arrival date & time: 02/14/19  1406     History   Chief Complaint Chief Complaint  Patient presents with  . Fall  . Hip Pain    HPI Danielle [name] is a 84 y.o. female.     Pt presents to the ED today with right hip pain.  The pt fell down off the couch 2 days ago.  Pt was unable to put any weight on her leg due to pain, but would not come to the hospital.  The pt has not wanted to eat or drink anything and has not taken her meds.  The pt finally agreed to come today due to the pain.  Pt speaks [place].  Her daughter is the Optometrist.     Past Medical History:  Diagnosis Date  . CAD S/P percutaneous coronary angioplasty November 2012   a)Severe 3 Vessel, s/p PCI with Resolute DES to LAD x1, LCx/OM x2, diffise distal LAD & entire RCA disease - not PCI amenable;; b) Myoview 12/2013: EF 67%,OINTERMEDIATE RISK - Inferior Ischemia c/w known anatomy (RCA CAD that is not PCI amenable)     . Chronic diastolic heart failure, NYHA class 2-3    Grade 1 Diastolic Dysfunction by echo  . CKD (chronic kidney disease), stage III   . Diabetes mellitus, type II, insulin dependent ([place])     Difficult to control; in obese, with CAD and PAD complications   . History of Non-ST elevated myocardial infarction (non-STEMI) 03/06/2011   Presentation was acute diastolic heart failure with edema and pulmonary edema.  Symptom was dyspnea but not specifically anginal chest pressure.  Marland Kitchen History of seasonal allergies   . History of stroke with residual effects Unsure   Old left-sided CVA --> S/P Left Carotid STENT in [place];   . History of stroke without residual deficits August 2011   Right Internal Capsule Stroke, nonhemorrhagic  . Hyperlipidemia   . Hypertension   . Obesity (BMI 30-39.9)   . Osteoarthritis     Patient Active Problem List   Diagnosis Date Noted  . Hip fracture ([place]) 02/14/2019  . Preoperative  cardiovascular examination 01/07/2014  . DOE (dyspnea on exertion) 12/17/2013  . CAD (coronary artery disease), CFX (x2) and LAD PCI in setting of non-STEMI and acute diastolic heart failure 0000000    Class: History of  . History of: Non-ST elevated myocardial infarction (non-STEMI) - inferior lateral ST depression, peak troponin 16 03/07/2011    Class: Diagnosis of  . Chronic diastolic CHF (congestive heart failure), NYHA class 2 ([place]) 03/06/2011    Class: Diagnosis of  . Diabetes mellitus, type II, insulin dependent ([place]) 03/06/2011  . Essential hypertension 03/06/2011  . Dyslipidemia, goal LDL below 70 03/06/2011  . PVD (peripheral vascular disease) ([place]) 03/06/2011  . History of CVA (cerebrovascular accident) 03/06/2011  . Obesity (BMI 30-39.9) 03/06/2011  . Chronic renal insufficiency, stage III (moderate) 03/06/2011  . Bilateral knee pain 09/20/2010  . Knee osteoarthritis 09/20/2010  . History of stroke without residual deficits - status post Left Carotid Stent. 03/14/2004    Class: History of    Past Surgical History:  Procedure Laterality Date  . CAROTID STENT Left    Performed in [place]  . CHOLECYSTECTOMY    . CORONARY ANGIOPLASTY WITH STENT PLACEMENT  03/16/11   LAD x1 (3.0 mm 20 mm (postdilated - 3.3 mm) Resolute DES, LCx/OM1 x2; distal 2.5 mm x 14 mm and proximal  3.0 mm at 26 mm Resolute DES; post-dilation 2.9 distal, 3.25 proximal  . DOPPLER ECHOCARDIOGRAPHY  03/07/2011   EF 55-60%; moderate concentric LVH, mild posterolateral hypokinesis.  Grade 1 Diastolic Dysfunction with high filling pressures.  . INTRAVASCULAR ULTRASOUND  03/12/2011   Procedure: INTRAVASCULAR ULTRASOUND;  Surgeon: Leonie Man, MD;  Location: [facility] CATH LAB;  Service: Cardiovascular;;  . LEFT HEART CATHETERIZATION WITH CORONARY ANGIOGRAM N/A 03/12/2011   Procedure: LEFT HEART CATHETERIZATION WITH CORONARY ANGIOGRAM;  Surgeon: Leonie Man, MD;  Location: [facility] CATH LAB;  Service: Cardiovascular;   Laterality: N/A;  . NM MYOVIEW LTD  September 2015   EF 67%,OINTERMEDIATE RISK - Inferior Ischemia c/w known anatomy (RCA CAD that is not PCI amenable)     . PERCUTANEOUS CORONARY STENT INTERVENTION (PCI-S) N/A 03/16/2011   Procedure: PERCUTANEOUS CORONARY STENT INTERVENTION (PCI-S);  Surgeon: Troy Sine, MD;  Location: [facility] CATH LAB;  Service: Cardiovascular;  Laterality: N/A;     OB History   No obstetric history on file.      Home Medications    Prior to Admission medications   Medication Sig Start Date End Date Taking? Authorizing Provider  acetaminophen (TYLENOL) 500 MG tablet Take 1,000 mg by mouth every 6 (six) hours as needed (pain).   Yes [provider]  amitriptyline (ELAVIL) 25 MG tablet Take 25 mg by mouth at bedtime.   Yes [provider]  atorvastatin (LIPITOR) 40 MG tablet TAKE 1 TABLET BY MOUTH AT BEDTIME Patient taking differently: Take 40 mg by mouth daily after supper.  07/04/18  Yes Leonie Man, MD  calcitRIOL (ROCALTROL) 0.25 MCG capsule Take 0.25 mcg by mouth every other day.   Yes [provider]  Coenzyme Q10 (COQ10 PO) Take 1 capsule by mouth daily with lunch.   Yes [provider]  Cyanocobalamin (VITAMIN B-12 SL) Place 100 mcg under the tongue daily with lunch.   Yes [provider]  denosumab (PROLIA) 60 MG/ML SOSY injection Inject 60 mg into the skin every 6 (six) months.   Yes [provider]  diclofenac sodium (VOLTAREN) 1 % GEL Apply 1 application topically 3 (three) times daily as needed (pain).   Yes [provider]  Dulaglutide 1.5 MG/0.5ML SOPN Inject 1.5 mg into the skin every Monday. Trulicity   Yes [provider]  furosemide (LASIX) 40 MG tablet TAKE 1 TABLET BY MOUTH THREE TIMES DAILY Patient taking differently: Take 20-40 mg by mouth See admin instructions. Take one tablet (40 mg) by mouth 1st day in the morning, then take 1/2 tablet (20 mg) 2nd day in the morning, then  repeat 09/15/18  Yes Leonie Man, MD  Icosapent Ethyl (VASCEPA) 0.5 g CAPS Take 1 g by mouth 2 (two) times daily after a meal.   Yes [provider]  insulin NPH (HUMULIN N,NOVOLIN N) 100 UNIT/ML injection Inject 55 Units into the skin 2 (two) times daily before a meal.    Yes [provider]  insulin regular (NOVOLIN R,HUMULIN R) 100 units/mL injection Inject 40 Units into the skin 3 (three) times daily before meals.    Yes [provider]  isosorbide mononitrate (IMDUR) 60 MG 24 hr tablet TAKE 1 AND 1/2 TABLETS(90 MG) BY MOUTH DAILY Patient taking differently: Take 30-60 mg by mouth See admin instructions. Take 1/2 tablet (30 mg) by mouth daily after breakfast, take 1 tablet (60 mg) after lunch ** DO NOT CRUSH ** 04/16/18  Yes Leonie Man, MD  losartan (COZAAR) 50 MG tablet Take 50 mg by mouth daily after breakfast.    Yes [provider]  metoprolol tartrate (LOPRESSOR) 25 MG tablet TAKE 1 TABLET BY MOUTH 2 TIMES DAILY Patient taking differently: Take 25 mg by mouth 2 (two) times daily after a meal.  01/15/19  Yes Leonie Man, MD  pantoprazole (PROTONIX) 40 MG tablet TAKE 1 TABLET BY MOUTH EVERY DAY Patient taking differently: Take 40 mg by mouth daily with lunch.  09/02/18  Yes Leonie Man, MD  POLY-IRON 150 150 MG capsule TAKE ONE CAPSULE BY MOUTH EVERY OTHER DAY. Patient taking differently: Take 150 mg by mouth See admin instructions. Take one capsule (150 mg) by mouth every other day after lunch 09/15/18  Yes Leonie Man, MD  ticagrelor (BRILINTA) 90 MG TABS tablet Take 1 tablet (90 mg total) by mouth 2 (two) times daily. NEEDS APPOINTMENT FOR FUTURE REFILLS Patient taking differently: Take 90 mg by mouth 2 (two) times daily after a meal. NEEDS APPOINTMENT FOR FUTURE REFILLS 02/13/19  Yes Leonie Man, MD  ACCU-CHEK COMPACT PLUS test strip  10/18/12   [provider]  B-D INS SYR ULTRAFINE 1CC/31G 31G X 5/16" 1 ML MISC Use as  directed 09/16/14   [provider]  cephALEXin (KEFLEX) 250 MG capsule TAKE 1 CAPSULE BY MOUTH 2 TIMES DAILY FOR 10 DAYS 09/13/16   [provider]  fenofibrate 54 MG tablet take 1 tablet by mouth once daily 05/24/16   Leonie Man, MD    Family History Family History  Family history unknown: Yes    Social History Social History   Tobacco Use  . Smoking status: Never Smoker  . Smokeless tobacco: Never Used  Substance Use Topics  . Alcohol use: No  . Drug use: No     Allergies   Lisinopril   Review of Systems Review of Systems  Musculoskeletal:       Right hip pain  All other systems reviewed and are negative.    Physical Exam Updated Vital Signs BP (!) 150/66   Pulse 99   Temp 99.2 F (37.3 C) (Oral)   Resp 14   SpO2 97%   Physical Exam Vitals signs and nursing note reviewed.  Constitutional:      Appearance: Normal appearance.  HENT:     Head: Normocephalic and atraumatic.     Right Ear: External ear normal.     Left Ear: External ear normal.     Nose: Nose normal.     Mouth/Throat:     Mouth: Mucous membranes are moist.     Pharynx: Oropharynx is clear.  Eyes:     Extraocular Movements: Extraocular movements intact.     Conjunctiva/sclera: Conjunctivae normal.     Pupils: Pupils are equal, round, and reactive to light.  Neck:     Musculoskeletal: Normal range of motion and neck supple.  Cardiovascular:     Rate and Rhythm: Normal rate and regular rhythm.     Pulses: Normal pulses.     Heart sounds: Normal heart sounds.  Pulmonary:     Effort: Pulmonary effort is normal.     Breath sounds: Normal breath sounds.  Abdominal:     General: Abdomen is flat. Bowel sounds are normal.     Palpations: Abdomen is soft.  Musculoskeletal:     Right hip: She exhibits deformity.  Skin:    General: Skin is warm and dry.  Neurological:     General:  No focal deficit present.     Mental Status: She is alert and oriented to person, place,  and time.  Psychiatric:        Mood and Affect: Mood normal.        Behavior: Behavior normal.      ED Treatments / Results  Labs (all labs ordered are listed, but only abnormal results are displayed) Labs Reviewed  BASIC METABOLIC PANEL - Abnormal; Notable for the following components:      Result Value   CO2 21 (*)    Glucose, Bld 412 (*)    BUN 65 (*)    Creatinine, Ser 3.35 (*)    Calcium 8.8 (*)    GFR calc non Af Amer 12 (*)    GFR calc Af Amer 14 (*)    All other components within normal limits  CBC - Abnormal; Notable for the following components:   WBC 11.2 (*)    RBC 3.26 (*)    Hemoglobin 9.8 (*)    HCT 31.3 (*)    All other components within normal limits  CBG MONITORING, ED - Abnormal; Notable for the following components:   Glucose-Capillary 401 (*)    All other components within normal limits  CBG MONITORING, ED - Abnormal; Notable for the following components:   Glucose-Capillary 387 (*)    All other components within normal limits  SARS CORONAVIRUS 2 (TAT 6-24 HRS)  PROTIME-INR  URINALYSIS, ROUTINE W REFLEX MICROSCOPIC  TYPE AND SCREEN    EKG EKG Interpretation  Date/Time:  Saturday February 14 2019 14:25:23 EST Ventricular Rate:  102 PR Interval:  182 QRS Duration: 80 QT Interval:  344 QTC Calculation: 448 R Axis:   95 Text Interpretation: Sinus tachycardia with occasional Premature ventricular complexes Rightward axis Nonspecific ST abnormality Abnormal ECG Since last tracing rate faster Confirmed by Isla Pence [phone]) on 02/14/2019 4:21:11 PM   Radiology Dg Chest 1 View  Result Date: 02/14/2019 CLINICAL DATA:  Hip fracture, preoperative EXAM: CHEST  1 VIEW COMPARISON:  12/27/2011 FINDINGS: Cardiomegaly. Abnormal appearance of the left hilum and left lung base, of uncertain significance, possibly related to chronic scarring and/or airspace disease. The visualized skeletal structures are unremarkable. IMPRESSION: 1. Cardiomegaly. 2.  Abnormal appearance of the left hilum and left lung base, of uncertain significance, possibly related to chronic scarring and/or airspace disease. Recommend PA and lateral radiographs to further evaluate. Electronically Signed   By: Eddie Candle M.D.   On: 02/14/2019 16:16   Dg Hip Unilat  With Pelvis 2-3 Views Right  Result Date: 02/14/2019 CLINICAL DATA:  Right hip pain after fall. EXAM: DG HIP (WITH OR WITHOUT PELVIS) 2-3V RIGHT COMPARISON:  None. FINDINGS: Acute impacted right intertrochanteric femur fracture with varus angulation. No dislocation. The hip joint spaces are relatively preserved. The pubic symphysis and sacroiliac joints are intact. Osteopenia. Vascular calcifications. IMPRESSION: 1. Acute impacted right intertrochanteric femur fracture. Electronically Signed   By: Titus Dubin M.D.   On: 02/14/2019 15:09    Procedures Procedures (including critical care time)  Medications Ordered in ED Medications  0.9 %  sodium chloride infusion ( Intravenous New Bag/Given 02/14/19 1641)  fentaNYL (SUBLIMAZE) injection 50 mcg (50 mcg Intravenous Given 02/14/19 1640)  sodium chloride flush (NS) 0.9 % injection 3 mL (3 mLs Intravenous Given 02/14/19 1641)  ondansetron (ZOFRAN) injection 4 mg (4 mg Intravenous Given 02/14/19 1640)  sodium chloride 0.9 % bolus 1,000 mL (1,000 mLs Intravenous New Bag/Given 02/14/19 1641)  Initial Impression / Assessment and Plan / ED Course  I have reviewed the triage vital signs and the nursing notes.  Pertinent labs & imaging results that were available during my care of the patient were reviewed by me and considered in my medical decision making (see chart for details).   Pain is better after IV fentanyl.  CXR recommends PA and Lat films in the future for further eval of the left hilum and lung base.  This can be done later as pt is unable to tolerate pa and lateral now.    Pt given IVFs for AKI and hyperglycemia.    Pt d/w Dr. Onnie Graham ([facility]) as pt  has seen Dr. Doran Durand in the past.  He will see pt in consult.  No OR tonight.  Pt d/w Dr. Laren Everts ([facility]) for admission.    Final Clinical Impressions(s) / ED Diagnoses   Final diagnoses:  Closed nondisplaced intertrochanteric fracture of right femur, initial encounter ([place])  Dehydration  AKI (acute kidney injury) [facility])  Hyperglycemia    ED Discharge Orders    None       Isla Pence, MD 02/14/19 1712

## 2019-02-14 NOTE — ED Notes (Signed)
Pt placed on 2L O2. O2 saturation is now at 97%

## 2019-02-14 NOTE — H&P (Signed)
[facility]                                                                                    Patient Demographics  Danielle [name], is a 84 y.o. female  CSN: [id number]  MRN: [id number]  DOB - 1935/03/08  Admit Date - 02/14/2019  Outpatient Primary MD for the patient is Jani Gravel, MD   With History of -  Past Medical History:  Diagnosis Date  . CAD S/P percutaneous coronary angioplasty November 2012   a)Severe 3 Vessel, s/p PCI with Resolute DES to LAD x1, LCx/OM x2, diffise distal LAD & entire RCA disease - not PCI amenable;; b) Myoview 12/2013: EF 67%,OINTERMEDIATE RISK - Inferior Ischemia c/w known anatomy (RCA CAD that is not PCI amenable)     . Chronic diastolic heart failure, NYHA class 2-3    Grade 1 Diastolic Dysfunction by echo  . CKD (chronic kidney disease), stage III   . Diabetes mellitus, type II, insulin dependent ([place])     Difficult to control; in obese, with CAD and PAD complications   . History of Non-ST elevated myocardial infarction (non-STEMI) 03/06/2011   Presentation was acute diastolic heart failure with edema and pulmonary edema.  Symptom was dyspnea but not specifically anginal chest pressure.  Marland Kitchen History of seasonal allergies   . History of stroke with residual effects Unsure   Old left-sided CVA --> S/P Left Carotid STENT in [place];   . History of stroke without residual deficits August 2011   Right Internal Capsule Stroke, nonhemorrhagic  . Hyperlipidemia   . Hypertension   . Obesity (BMI 30-39.9)   . Osteoarthritis       Past Surgical History:  Procedure Laterality Date  . CAROTID STENT Left    Performed in [place]  . CHOLECYSTECTOMY    . CORONARY ANGIOPLASTY WITH STENT PLACEMENT  03/16/11   LAD x1 (3.0 mm 20 mm (postdilated - 3.3 mm) Resolute DES, LCx/OM1 x2; distal 2.5 mm x 14 mm and proximal 3.0 mm at 26 mm Resolute DES; post-dilation 2.9 distal, 3.25 proximal  . DOPPLER ECHOCARDIOGRAPHY  03/07/2011   EF 55-60%; moderate concentric  LVH, mild posterolateral hypokinesis.  Grade 1 Diastolic Dysfunction with high filling pressures.  . INTRAVASCULAR ULTRASOUND  03/12/2011   Procedure: INTRAVASCULAR ULTRASOUND;  Surgeon: Leonie Man, MD;  Location: [facility] CATH LAB;  Service: Cardiovascular;;  . LEFT HEART CATHETERIZATION WITH CORONARY ANGIOGRAM N/A 03/12/2011   Procedure: LEFT HEART CATHETERIZATION WITH CORONARY ANGIOGRAM;  Surgeon: Leonie Man, MD;  Location: [facility] CATH LAB;  Service: Cardiovascular;  Laterality: N/A;  . NM MYOVIEW LTD  September 2015   EF 67%,OINTERMEDIATE RISK - Inferior Ischemia c/w known anatomy (RCA CAD that is not PCI amenable)     . PERCUTANEOUS CORONARY STENT INTERVENTION (PCI-S) N/A 03/16/2011   Procedure: PERCUTANEOUS CORONARY STENT INTERVENTION (PCI-S);  Surgeon: Troy Sine, MD;  Location: [facility] CATH LAB;  Service: Cardiovascular;  Laterality: N/A;    in for   Chief Complaint  Patient presents with  . Fall  . Hip Pain     HPI  Danielle [name]  is a 84 y.o. female, with past medical  history significant for insulin-dependent diabetes mellitus type 2, hyperlipidemia hypertension, chronic kidney disease and coronary artery disease status post MI in the past presenting today status post a fall 2 days ago with right hip trauma.  The patient elected not to seek care medical attention at the time of the trauma.  Patient denies any chest pains or shortness of breath preceding the episode.  It was just a mechanical fall according to the family. Work-up in the emergency room showed right impacted trochanteric fracture.  Her creatinine was 3.35.  Hemoglobin 9.8. Orthopedic surgery was consulted and they will try to do surgery in a.m.    Review of Systems    Drowsy/confused after pain medications   Social History Social History   Tobacco Use  . Smoking status: Never Smoker  . Smokeless tobacco: Never Used  Substance Use Topics  . Alcohol use: No     Family History Family History  Family history  unknown: Yes     Prior to Admission medications   Medication Sig Start Date End Date Taking? Authorizing Provider  acetaminophen (TYLENOL) 500 MG tablet Take 1,000 mg by mouth every 6 (six) hours as needed (pain).   Yes [provider]  amitriptyline (ELAVIL) 25 MG tablet Take 25 mg by mouth at bedtime.   Yes [provider]  atorvastatin (LIPITOR) 40 MG tablet TAKE 1 TABLET BY MOUTH AT BEDTIME Patient taking differently: Take 40 mg by mouth daily after supper.  07/04/18  Yes Leonie Man, MD  calcitRIOL (ROCALTROL) 0.25 MCG capsule Take 0.25 mcg by mouth every other day.   Yes [provider]  Coenzyme Q10 (COQ10 PO) Take 1 capsule by mouth daily with lunch.   Yes [provider]  Cyanocobalamin (VITAMIN B-12 SL) Place 100 mcg under the tongue daily with lunch.   Yes [provider]  denosumab (PROLIA) 60 MG/ML SOSY injection Inject 60 mg into the skin every 6 (six) months.   Yes [provider]  diclofenac sodium (VOLTAREN) 1 % GEL Apply 1 application topically 3 (three) times daily as needed (pain).   Yes [provider]  Dulaglutide 1.5 MG/0.5ML SOPN Inject 1.5 mg into the skin every Monday. Trulicity   Yes [provider]  furosemide (LASIX) 40 MG tablet TAKE 1 TABLET BY MOUTH THREE TIMES DAILY Patient taking differently: Take 20-40 mg by mouth See admin instructions. Take one tablet (40 mg) by mouth 1st day in the morning, then take 1/2 tablet (20 mg) 2nd day in the morning, then repeat 09/15/18  Yes Leonie Man, MD  Icosapent Ethyl (VASCEPA) 0.5 g CAPS Take 1 g by mouth 2 (two) times daily after a meal.   Yes [provider]  insulin NPH (HUMULIN N,NOVOLIN N) 100 UNIT/ML injection Inject 55 Units into the skin 2 (two) times daily before a meal.    Yes [provider]  insulin regular (NOVOLIN R,HUMULIN R) 100 units/mL injection Inject 40 Units into the skin 3 (three) times daily before meals.     Yes [provider]  isosorbide mononitrate (IMDUR) 60 MG 24 hr tablet TAKE 1 AND 1/2 TABLETS(90 MG) BY MOUTH DAILY Patient taking differently: Take 30-60 mg by mouth See admin instructions. Take 1/2 tablet (30 mg) by mouth daily after breakfast, take 1 tablet (60 mg) after lunch ** DO NOT CRUSH ** 04/16/18  Yes Leonie Man, MD  losartan (COZAAR) 50 MG tablet Take 50 mg by mouth daily after breakfast.  Yes [provider]  metoprolol tartrate (LOPRESSOR) 25 MG tablet TAKE 1 TABLET BY MOUTH 2 TIMES DAILY Patient taking differently: Take 25 mg by mouth 2 (two) times daily after a meal.  01/15/19  Yes Leonie Man, MD  pantoprazole (PROTONIX) 40 MG tablet TAKE 1 TABLET BY MOUTH EVERY DAY Patient taking differently: Take 40 mg by mouth daily with lunch.  09/02/18  Yes Leonie Man, MD  POLY-IRON 150 150 MG capsule TAKE ONE CAPSULE BY MOUTH EVERY OTHER DAY. Patient taking differently: Take 150 mg by mouth See admin instructions. Take one capsule (150 mg) by mouth every other day after lunch 09/15/18  Yes Leonie Man, MD  sodium bicarbonate 325 MG tablet Take 162.5 mg by mouth daily after breakfast.  01/15/19  Yes [provider]  ticagrelor (BRILINTA) 90 MG TABS tablet Take 1 tablet (90 mg total) by mouth 2 (two) times daily. NEEDS APPOINTMENT FOR FUTURE REFILLS Patient taking differently: Take 90 mg by mouth 2 (two) times daily after a meal. NEEDS APPOINTMENT FOR FUTURE REFILLS 02/13/19  Yes Leonie Man, MD  ACCU-CHEK COMPACT PLUS test strip  10/18/12   [provider]  B-D INS SYR ULTRAFINE 1CC/31G 31G X 5/16" 1 ML MISC Use as directed 09/16/14   [provider]    Allergies  Allergen Reactions  . Lisinopril Cough    Physical Exam  Vitals  Blood pressure (!) 150/66, pulse 99, temperature 99.2 F (37.3 C), temperature source Oral, resp. rate 14, SpO2 97 %.   General appearance elderly female, drowsy, confused HEENT no jaundice  pallor, no facial deviation Neck supple, no neck vein distention Chest good air entry bilaterally with decreased breath sounds at the bases Heart normal S1-S2 with PVCs Abdomen soft nontender bowel sounds present/obese Extremities no clubbing cyanosis or edema noted.  Unequal length Skin no rashes or ulcers  Data Review  CBC Recent Labs  Lab 02/14/19 1436  WBC 11.2*  HGB 9.8*  HCT 31.3*  PLT 154  MCV 96.0  MCH 30.1  MCHC 31.3  RDW 14.1   ------------------------------------------------------------------------------------------------------------------  Chemistries  Recent Labs  Lab 02/14/19 1436  NA 140  K 4.7  CL 106  CO2 21*  GLUCOSE 412*  BUN 65*  CREATININE 3.35*  CALCIUM 8.8*   ------------------------------------------------------------------------------------------------------------------ CrCl cannot be calculated (Unknown ideal weight.). ------------------------------------------------------------------------------------------------------------------ No results for input(s): TSH, T4TOTAL, T3FREE, THYROIDAB in the last 72 hours.  Invalid input(s): FREET3   Coagulation profile Recent Labs  Lab 02/14/19 1658  INR 1.2   ------------------------------------------------------------------------------------------------------------------- No results for input(s): DDIMER in the last 72 hours. -------------------------------------------------------------------------------------------------------------------  Cardiac Enzymes No results for input(s): CKMB, TROPONINI, MYOGLOBIN in the last 168 hours.  Invalid input(s): CK ------------------------------------------------------------------------------------------------------------------ Invalid input(s): POCBNP   ---------------------------------------------------------------------------------------------------------------  Urinalysis    Component Value Date/Time   COLORURINE YELLOW 03/15/2011 2050    APPEARANCEUR CLEAR 03/15/2011 2050   LABSPEC 1.016 03/15/2011 2050   PHURINE 6.0 03/15/2011 2050   GLUCOSEU 500 (A) 03/15/2011 2050   HGBUR NEGATIVE 03/15/2011 2050   [place] NEGATIVE 03/15/2011 2050   KETONESUR NEGATIVE 03/15/2011 2050   PROTEINUR NEGATIVE 03/15/2011 2050   UROBILINOGEN 0.2 03/15/2011 2050   NITRITE NEGATIVE 03/15/2011 2050   LEUKOCYTESUR NEGATIVE 03/15/2011 2050    ----------------------------------------------------------------------------------------------------------------  .   Imaging results:   Dg Chest 1 View  Result Date: 02/14/2019 CLINICAL DATA:  Hip fracture, preoperative EXAM: CHEST  1 VIEW COMPARISON:  12/27/2011 FINDINGS: Cardiomegaly. Abnormal appearance of the left hilum and  left lung base, of uncertain significance, possibly related to chronic scarring and/or airspace disease. The visualized skeletal structures are unremarkable. IMPRESSION: 1. Cardiomegaly. 2. Abnormal appearance of the left hilum and left lung base, of uncertain significance, possibly related to chronic scarring and/or airspace disease. Recommend PA and lateral radiographs to further evaluate. Electronically Signed   By: Eddie Candle M.D.   On: 02/14/2019 16:16   Dg Hip Unilat  With Pelvis 2-3 Views Right  Result Date: 02/14/2019 CLINICAL DATA:  Right hip pain after fall. EXAM: DG HIP (WITH OR WITHOUT PELVIS) 2-3V RIGHT COMPARISON:  None. FINDINGS: Acute impacted right intertrochanteric femur fracture with varus angulation. No dislocation. The hip joint spaces are relatively preserved. The pubic symphysis and sacroiliac joints are intact. Osteopenia. Vascular calcifications. IMPRESSION: 1. Acute impacted right intertrochanteric femur fracture. Electronically Signed   By: Titus Dubin M.D.   On: 02/14/2019 15:09    My personal review of EKG: Tach with PVCs at 102 bpm with nonspecific ST changes  Assessment & Plan  Right hip fracture Orthopedics consulted Pain  control  Acute on chronic renal failure IV fluids, hold Lasix and losartan  Hypertension Hold losartan due to renal failure  History of coronary artery disease status post MI Continue with Imdur and beta-blocker Hold Plavix due to surgery.  Did not take her medication for the last 2 days   DVT Prophylaxis Heparin  AM Labs Ordered, also please review Full Orders  Family Communication: Family at bedside   code Status full  Disposition Plan: To be determined  Time spent in minutes : More than 40 minutes  Condition GUARDED   @SIGNATURE @

## 2019-02-14 NOTE — ED Triage Notes (Addendum)
Pt from home with ems for fall 2 days ago, c.o right hip pain, no deformity noted. Per family, pt has been groggy since yesterday, not taking her medications.  CBG 476 BP 140/70 90% on room air, 2L [place] applied  Pt alert, speaks no english. Transferred to stretcher in triage

## 2019-02-14 NOTE — H&P (View-Only) (Signed)
Reason for Consult: Patient status post mechanical fall with complaints of right hip pain and inability to bear weight Referring Physician: EDP  HPI: Danielle [name] is an 84 y.o. female with multiple underlying chronic medical comorbidities including insulin-dependent diabetes, hypertension, chronic kidney disease, and coronary artery disease status post previous stenting, presents to the ER today after a mechanical fall 2 days ago with subsequent inability to bear weight and ongoing complaints of right hip pain.  Patient speaks [place] and her daughter serves as her interpreter.  Past Medical History:  Diagnosis Date  . CAD S/P percutaneous coronary angioplasty November 2012   a)Severe 3 Vessel, s/p PCI with Resolute DES to LAD x1, LCx/OM x2, diffise distal LAD & entire RCA disease - not PCI amenable;; b) Myoview 12/2013: EF 67%,OINTERMEDIATE RISK - Inferior Ischemia c/w known anatomy (RCA CAD that is not PCI amenable)     . Chronic diastolic heart failure, NYHA class 2-3    Grade 1 Diastolic Dysfunction by echo  . CKD (chronic kidney disease), stage III   . Diabetes mellitus, type II, insulin dependent ([place])     Difficult to control; in obese, with CAD and PAD complications   . History of Non-ST elevated myocardial infarction (non-STEMI) 03/06/2011   Presentation was acute diastolic heart failure with edema and pulmonary edema.  Symptom was dyspnea but not specifically anginal chest pressure.  Marland Kitchen History of seasonal allergies   . History of stroke with residual effects Unsure   Old left-sided CVA --> S/P Left Carotid STENT in [place];   . History of stroke without residual deficits August 2011   Right Internal Capsule Stroke, nonhemorrhagic  . Hyperlipidemia   . Hypertension   . Obesity (BMI 30-39.9)   . Osteoarthritis     Past Surgical History:  Procedure Laterality Date  . CAROTID STENT Left    Performed in [place]  . CHOLECYSTECTOMY    . CORONARY ANGIOPLASTY WITH STENT PLACEMENT  03/16/11    LAD x1 (3.0 mm 20 mm (postdilated - 3.3 mm) Resolute DES, LCx/OM1 x2; distal 2.5 mm x 14 mm and proximal 3.0 mm at 26 mm Resolute DES; post-dilation 2.9 distal, 3.25 proximal  . DOPPLER ECHOCARDIOGRAPHY  03/07/2011   EF 55-60%; moderate concentric LVH, mild posterolateral hypokinesis.  Grade 1 Diastolic Dysfunction with high filling pressures.  . INTRAVASCULAR ULTRASOUND  03/12/2011   Procedure: INTRAVASCULAR ULTRASOUND;  Surgeon: Leonie Man, MD;  Location: [facility] CATH LAB;  Service: Cardiovascular;;  . LEFT HEART CATHETERIZATION WITH CORONARY ANGIOGRAM N/A 03/12/2011   Procedure: LEFT HEART CATHETERIZATION WITH CORONARY ANGIOGRAM;  Surgeon: Leonie Man, MD;  Location: [facility] CATH LAB;  Service: Cardiovascular;  Laterality: N/A;  . NM MYOVIEW LTD  September 2015   EF 67%,OINTERMEDIATE RISK - Inferior Ischemia c/w known anatomy (RCA CAD that is not PCI amenable)     . PERCUTANEOUS CORONARY STENT INTERVENTION (PCI-S) N/A 03/16/2011   Procedure: PERCUTANEOUS CORONARY STENT INTERVENTION (PCI-S);  Surgeon: Troy Sine, MD;  Location: [facility] CATH LAB;  Service: Cardiovascular;  Laterality: N/A;    Family History  Family history unknown: Yes    Social History:  reports that she has never smoked. She has never used smokeless tobacco. She reports that she does not drink alcohol or use drugs.  Allergies:  Allergies  Allergen Reactions  . Lisinopril Cough    Medications: Prior to Admission: (Not in a hospital admission)   Results for orders placed or performed during the hospital encounter of 02/14/19 (from the  past 48 hour(s))  CBG monitoring, ED     Status: Abnormal   Collection Time: 02/14/19  2:20 PM  Result Value Ref Range   Glucose-Capillary 401 (H) 70 - 99 mg/dL   Comment 1 Notify RN    Comment 2 Document in Chart   Basic metabolic panel     Status: Abnormal   Collection Time: 02/14/19  2:36 PM  Result Value Ref Range   Sodium 140 135 - 145 mmol/L   Potassium 4.7 3.5 - 5.1 mmol/L    Chloride 106 98 - 111 mmol/L   CO2 21 (L) 22 - 32 mmol/L   Glucose, Bld 412 (H) 70 - 99 mg/dL   BUN 65 (H) 8 - 23 mg/dL   Creatinine, Ser 3.35 (H) 0.44 - 1.00 mg/dL   Calcium 8.8 (L) 8.9 - 10.3 mg/dL   GFR calc non Af Amer 12 (L) >60 mL/min   GFR calc Af Amer 14 (L) >60 mL/min   Anion gap 13 5 - 15    Comment: Performed at [facility] [address] [place], [place] [address]  CBC     Status: Abnormal   Collection Time: 02/14/19  2:36 PM  Result Value Ref Range   WBC 11.2 (H) 4.0 - 10.5 K/uL   RBC 3.26 (L) 3.87 - 5.11 MIL/uL   Hemoglobin 9.8 (L) 12.0 - 15.0 g/dL   HCT 31.3 (L) 36.0 - 46.0 %   MCV 96.0 80.0 - 100.0 fL   MCH 30.1 26.0 - 34.0 pg   MCHC 31.3 30.0 - 36.0 g/dL   RDW 14.1 11.5 - 15.5 %   Platelets 154 150 - 400 K/uL   nRBC 0.0 0.0 - 0.2 %    Comment: Performed at [facility], [place] [address] [place], [place] [address]  CBG monitoring, ED     Status: Abnormal   Collection Time: 02/14/19  4:30 PM  Result Value Ref Range   Glucose-Capillary 387 (H) 70 - 99 mg/dL   Comment 1 Notify RN    Comment 2 Document in Chart   Protime-INR     Status: None   Collection Time: 02/14/19  4:58 PM  Result Value Ref Range   Prothrombin Time 14.6 11.4 - 15.2 seconds   INR 1.2 0.8 - 1.2    Comment: (NOTE) INR goal varies based on device and disease states. Performed at [facility], [place] [address] [place], [place] [address]   Type and screen [place]     Status: None (Preliminary result)   Collection Time: 02/14/19  4:58 PM  Result Value Ref Range   ABO/RH(D) PENDING    Antibody Screen PENDING    Sample Expiration      02/17/2019,2359 Performed at [facility], [place] [address], [place], [place] [address]     Dg Chest 1 View  Result Date: 02/14/2019 CLINICAL DATA:  Hip fracture, preoperative EXAM: CHEST  1 VIEW COMPARISON:  12/27/2011 FINDINGS: Cardiomegaly. Abnormal appearance of the left hilum and left lung base, of uncertain  significance, possibly related to chronic scarring and/or airspace disease. The visualized skeletal structures are unremarkable. IMPRESSION: 1. Cardiomegaly. 2. Abnormal appearance of the left hilum and left lung base, of uncertain significance, possibly related to chronic scarring and/or airspace disease. Recommend PA and lateral radiographs to further evaluate. Electronically Signed   By: Eddie Candle M.D.   On: 02/14/2019 16:16   Dg Hip Unilat  With Pelvis 2-3  Views Right  Result Date: 02/14/2019 CLINICAL DATA:  Right hip pain after fall. EXAM: DG HIP (WITH OR WITHOUT PELVIS) 2-3V RIGHT COMPARISON:  None. FINDINGS: Acute impacted right intertrochanteric femur fracture with varus angulation. No dislocation. The hip joint spaces are relatively preserved. The pubic symphysis and sacroiliac joints are intact. Osteopenia. Vascular calcifications. IMPRESSION: 1. Acute impacted right intertrochanteric femur fracture. Electronically Signed   By: Titus Dubin M.D.   On: 02/14/2019 15:09     Vitals Temp:  [99.2 F (37.3 C)] 99.2 F (37.3 C) (11/07 1415) Pulse Rate:  [99-108] 101 (11/07 1730) Resp:  [14-20] 15 (11/07 1730) BP: (141-170)/(66-79) 149/79 (11/07 1730) SpO2:  [89 %-100 %] 99 % (11/07 1730) There is no height or weight on file to calculate BMI.  Physical Exam: Patient is an obese elderly female with limited communication utilizing her daughter as an interpreter.  Normocephalic, no obvious facial trauma.  She describes some tenderness with palpation along the clavicles but there is no crepitance or ecchymosis.  She reports no pain with gentle passive motion of both upper extremities and there is no gross bone or joint instability and no areas of ecchymosis or swelling in the upper extremities.  Abdomen is obese with multiple striae but no masses or discrete tenderness.  Left lower extremity demonstrates no gross bone or joint instability with no local tenderness.  She has extreme pain with  attempts at right hip motion.  Poorly cooperative with the exam regarding active motion of ankles but is grossly neurovascular intact distally.     Assessment/Plan: Impression:  #1  Acute right intertrochanteric hip fracture  2.  Multiple underlying chronic medical comorbidities   Treatment:  I have counseled the patient's daughter regarding the radiographic findings as well as the natural history and treatment options available for this intertrochanteric hip fracture.  Her multiple underlying chronic medical comorbidities certainly place her at increased risk for perioperative morbidity and/or mortality.  I have spoken with my partner Dr. Rod Can who is a specialist in handling complex hip fractures and he has kindly agreed to participate in Danielle [name]'s care, and he anticipates being able to proceed with surgery on Monday, exact time to be determined but potentially midday.  During the interim continue with bed rest positioning the right lower extremity for comfort.  Greatly appreciate the hospitalist perioperative evaluation and treatment.  [place] Selke M Tyberius Ryner 02/14/2019, 5:53 PM  Contact # [phone]

## 2019-02-14 NOTE — Consult Note (Signed)
Reason for Consult: Patient status post mechanical fall with complaints of right hip pain and inability to bear weight Referring Physician: EDP  HPI: Danielle [name] is an 84 y.o. female with multiple underlying chronic medical comorbidities including insulin-dependent diabetes, hypertension, chronic kidney disease, and coronary artery disease status post previous stenting, presents to the ER today after a mechanical fall 2 days ago with subsequent inability to bear weight and ongoing complaints of right hip pain.  Patient speaks [place] and her daughter serves as her interpreter.  Past Medical History:  Diagnosis Date  . CAD S/P percutaneous coronary angioplasty November 2012   a)Severe 3 Vessel, s/p PCI with Resolute DES to LAD x1, LCx/OM x2, diffise distal LAD & entire RCA disease - not PCI amenable;; b) Myoview 12/2013: EF 67%,OINTERMEDIATE RISK - Inferior Ischemia c/w known anatomy (RCA CAD that is not PCI amenable)     . Chronic diastolic heart failure, NYHA class 2-3    Grade 1 Diastolic Dysfunction by echo  . CKD (chronic kidney disease), stage III   . Diabetes mellitus, type II, insulin dependent ([place])     Difficult to control; in obese, with CAD and PAD complications   . History of Non-ST elevated myocardial infarction (non-STEMI) 03/06/2011   Presentation was acute diastolic heart failure with edema and pulmonary edema.  Symptom was dyspnea but not specifically anginal chest pressure.  Marland Kitchen History of seasonal allergies   . History of stroke with residual effects Unsure   Old left-sided CVA --> S/P Left Carotid STENT in [place];   . History of stroke without residual deficits August 2011   Right Internal Capsule Stroke, nonhemorrhagic  . Hyperlipidemia   . Hypertension   . Obesity (BMI 30-39.9)   . Osteoarthritis     Past Surgical History:  Procedure Laterality Date  . CAROTID STENT Left    Performed in [place]  . CHOLECYSTECTOMY    . CORONARY ANGIOPLASTY WITH STENT PLACEMENT  03/16/11    LAD x1 (3.0 mm 20 mm (postdilated - 3.3 mm) Resolute DES, LCx/OM1 x2; distal 2.5 mm x 14 mm and proximal 3.0 mm at 26 mm Resolute DES; post-dilation 2.9 distal, 3.25 proximal  . DOPPLER ECHOCARDIOGRAPHY  03/07/2011   EF 55-60%; moderate concentric LVH, mild posterolateral hypokinesis.  Grade 1 Diastolic Dysfunction with high filling pressures.  . INTRAVASCULAR ULTRASOUND  03/12/2011   Procedure: INTRAVASCULAR ULTRASOUND;  Surgeon: Leonie Man, MD;  Location: [facility] CATH LAB;  Service: Cardiovascular;;  . LEFT HEART CATHETERIZATION WITH CORONARY ANGIOGRAM N/A 03/12/2011   Procedure: LEFT HEART CATHETERIZATION WITH CORONARY ANGIOGRAM;  Surgeon: Leonie Man, MD;  Location: [facility] CATH LAB;  Service: Cardiovascular;  Laterality: N/A;  . NM MYOVIEW LTD  September 2015   EF 67%,OINTERMEDIATE RISK - Inferior Ischemia c/w known anatomy (RCA CAD that is not PCI amenable)     . PERCUTANEOUS CORONARY STENT INTERVENTION (PCI-S) N/A 03/16/2011   Procedure: PERCUTANEOUS CORONARY STENT INTERVENTION (PCI-S);  Surgeon: Troy Sine, MD;  Location: [facility] CATH LAB;  Service: Cardiovascular;  Laterality: N/A;    Family History  Family history unknown: Yes    Social History:  reports that she has never smoked. She has never used smokeless tobacco. She reports that she does not drink alcohol or use drugs.  Allergies:  Allergies  Allergen Reactions  . Lisinopril Cough    Medications: Prior to Admission: (Not in a hospital admission)   Results for orders placed or performed during the hospital encounter of 02/14/19 (from the  past 48 hour(s))  CBG monitoring, ED     Status: Abnormal   Collection Time: 02/14/19  2:20 PM  Result Value Ref Range   Glucose-Capillary 401 (H) 70 - 99 mg/dL   Comment 1 Notify RN    Comment 2 Document in Chart   Basic metabolic panel     Status: Abnormal   Collection Time: 02/14/19  2:36 PM  Result Value Ref Range   Sodium 140 135 - 145 mmol/L   Potassium 4.7 3.5 - 5.1 mmol/L    Chloride 106 98 - 111 mmol/L   CO2 21 (L) 22 - 32 mmol/L   Glucose, Bld 412 (H) 70 - 99 mg/dL   BUN 65 (H) 8 - 23 mg/dL   Creatinine, Ser 3.35 (H) 0.44 - 1.00 mg/dL   Calcium 8.8 (L) 8.9 - 10.3 mg/dL   GFR calc non Af Amer 12 (L) >60 mL/min   GFR calc Af Amer 14 (L) >60 mL/min   Anion gap 13 5 - 15    Comment: Performed at [facility] [address], [place], [place] [address]  CBC     Status: Abnormal   Collection Time: 02/14/19  2:36 PM  Result Value Ref Range   WBC 11.2 (H) 4.0 - 10.5 K/uL   RBC 3.26 (L) 3.87 - 5.11 MIL/uL   Hemoglobin 9.8 (L) 12.0 - 15.0 g/dL   HCT 31.3 (L) 36.0 - 46.0 %   MCV 96.0 80.0 - 100.0 fL   MCH 30.1 26.0 - 34.0 pg   MCHC 31.3 30.0 - 36.0 g/dL   RDW 14.1 11.5 - 15.5 %   Platelets 154 150 - 400 K/uL   nRBC 0.0 0.0 - 0.2 %    Comment: Performed at [facility], [place] [address] [place], [place] [address]  CBG monitoring, ED     Status: Abnormal   Collection Time: 02/14/19  4:30 PM  Result Value Ref Range   Glucose-Capillary 387 (H) 70 - 99 mg/dL   Comment 1 Notify RN    Comment 2 Document in Chart   Protime-INR     Status: None   Collection Time: 02/14/19  4:58 PM  Result Value Ref Range   Prothrombin Time 14.6 11.4 - 15.2 seconds   INR 1.2 0.8 - 1.2    Comment: (NOTE) INR goal varies based on device and disease states. Performed at [facility], [place] [address] [place], [place] [address]   Type and screen [place]     Status: None (Preliminary result)   Collection Time: 02/14/19  4:58 PM  Result Value Ref Range   ABO/RH(D) PENDING    Antibody Screen PENDING    Sample Expiration      02/17/2019,2359 Performed at [facility], [place] [address], [place], [place] [address]     Dg Chest 1 View  Result Date: 02/14/2019 CLINICAL DATA:  Hip fracture, preoperative EXAM: CHEST  1 VIEW COMPARISON:  12/27/2011 FINDINGS: Cardiomegaly. Abnormal appearance of the left hilum and left lung base, of uncertain  significance, possibly related to chronic scarring and/or airspace disease. The visualized skeletal structures are unremarkable. IMPRESSION: 1. Cardiomegaly. 2. Abnormal appearance of the left hilum and left lung base, of uncertain significance, possibly related to chronic scarring and/or airspace disease. Recommend PA and lateral radiographs to further evaluate. Electronically Signed   By: Eddie Candle M.D.   On: 02/14/2019 16:16   Dg Hip Unilat  With Pelvis 2-3  Views Right  Result Date: 02/14/2019 CLINICAL DATA:  Right hip pain after fall. EXAM: DG HIP (WITH OR WITHOUT PELVIS) 2-3V RIGHT COMPARISON:  None. FINDINGS: Acute impacted right intertrochanteric femur fracture with varus angulation. No dislocation. The hip joint spaces are relatively preserved. The pubic symphysis and sacroiliac joints are intact. Osteopenia. Vascular calcifications. IMPRESSION: 1. Acute impacted right intertrochanteric femur fracture. Electronically Signed   By: Titus Dubin M.D.   On: 02/14/2019 15:09     Vitals Temp:  [99.2 F (37.3 C)] 99.2 F (37.3 C) (11/07 1415) Pulse Rate:  [99-108] 101 (11/07 1730) Resp:  [14-20] 15 (11/07 1730) BP: (141-170)/(66-79) 149/79 (11/07 1730) SpO2:  [89 %-100 %] 99 % (11/07 1730) There is no height or weight on file to calculate BMI.  Physical Exam: Patient is an obese elderly female with limited communication utilizing her daughter as an interpreter.  Normocephalic, no obvious facial trauma.  She describes some tenderness with palpation along the clavicles but there is no crepitance or ecchymosis.  She reports no pain with gentle passive motion of both upper extremities and there is no gross bone or joint instability and no areas of ecchymosis or swelling in the upper extremities.  Abdomen is obese with multiple striae but no masses or discrete tenderness.  Left lower extremity demonstrates no gross bone or joint instability with no local tenderness.  She has extreme pain with  attempts at right hip motion.  Poorly cooperative with the exam regarding active motion of ankles but is grossly neurovascular intact distally.     Assessment/Plan: Impression:  #1  Acute right intertrochanteric hip fracture  2.  Multiple underlying chronic medical comorbidities   Treatment:  I have counseled the patient's daughter regarding the radiographic findings as well as the natural history and treatment options available for this intertrochanteric hip fracture.  Her multiple underlying chronic medical comorbidities certainly place her at increased risk for perioperative morbidity and/or mortality.  I have spoken with my partner Dr. Rod Can who is a specialist in handling complex hip fractures and he has kindly agreed to participate in Ms. Toya's care, and he anticipates being able to proceed with surgery on Monday, exact time to be determined but potentially midday.  During the interim continue with bed rest positioning the right lower extremity for comfort.  Greatly appreciate the hospitalist perioperative evaluation and treatment.  Ama Mcmaster M Hensley Aziz 02/14/2019, 5:53 PM  Contact # [phone]

## 2019-02-15 ENCOUNTER — Inpatient Hospital Stay (HOSPITAL_COMMUNITY): Payer: Medicare Other

## 2019-02-15 DIAGNOSIS — Z01810 Encounter for preprocedural cardiovascular examination: Secondary | ICD-10-CM

## 2019-02-15 DIAGNOSIS — N189 Chronic kidney disease, unspecified: Secondary | ICD-10-CM

## 2019-02-15 DIAGNOSIS — N049 Nephrotic syndrome with unspecified morphologic changes: Secondary | ICD-10-CM

## 2019-02-15 DIAGNOSIS — I5032 Chronic diastolic (congestive) heart failure: Secondary | ICD-10-CM

## 2019-02-15 DIAGNOSIS — S72141D Displaced intertrochanteric fracture of right femur, subsequent encounter for closed fracture with routine healing: Secondary | ICD-10-CM

## 2019-02-15 DIAGNOSIS — I2510 Atherosclerotic heart disease of native coronary artery without angina pectoris: Secondary | ICD-10-CM

## 2019-02-15 DIAGNOSIS — I5033 Acute on chronic diastolic (congestive) heart failure: Secondary | ICD-10-CM

## 2019-02-15 LAB — HEPATIC FUNCTION PANEL
ALT: 11 U/L (ref 0–44)
AST: 17 U/L (ref 15–41)
Albumin: 3 g/dL — ABNORMAL LOW (ref 3.5–5.0)
Alkaline Phosphatase: 169 U/L — ABNORMAL HIGH (ref 38–126)
Bilirubin, Direct: 0.2 mg/dL (ref 0.0–0.2)
Indirect Bilirubin: 0.3 mg/dL (ref 0.3–0.9)
Total Bilirubin: 0.5 mg/dL (ref 0.3–1.2)
Total Protein: 7.6 g/dL (ref 6.5–8.1)

## 2019-02-15 LAB — BASIC METABOLIC PANEL
Anion gap: 10 (ref 5–15)
BUN: 62 mg/dL — ABNORMAL HIGH (ref 8–23)
CO2: 23 mmol/L (ref 22–32)
Calcium: 8.6 mg/dL — ABNORMAL LOW (ref 8.9–10.3)
Chloride: 110 mmol/L (ref 98–111)
Creatinine, Ser: 3.12 mg/dL — ABNORMAL HIGH (ref 0.44–1.00)
GFR calc Af Amer: 15 mL/min — ABNORMAL LOW (ref >60)
GFR calc non Af Amer: 13 mL/min — ABNORMAL LOW (ref >60)
Glucose, Bld: 289 mg/dL — ABNORMAL HIGH (ref 70–99)
Potassium: 4.9 mmol/L (ref 3.5–5.1)
Sodium: 143 mmol/L (ref 135–145)

## 2019-02-15 LAB — GLUCOSE, CAPILLARY
Glucose-Capillary: 223 mg/dL — ABNORMAL HIGH (ref 70–99)
Glucose-Capillary: 253 mg/dL — ABNORMAL HIGH (ref 70–99)
Glucose-Capillary: 276 mg/dL — ABNORMAL HIGH (ref 70–99)
Glucose-Capillary: 280 mg/dL — ABNORMAL HIGH (ref 70–99)
Glucose-Capillary: 283 mg/dL — ABNORMAL HIGH (ref 70–99)
Glucose-Capillary: 397 mg/dL — ABNORMAL HIGH (ref 70–99)
Glucose-Capillary: 458 mg/dL — ABNORMAL HIGH (ref 70–99)

## 2019-02-15 LAB — SURGICAL PCR SCREEN
MRSA, PCR: NEGATIVE
Staphylococcus aureus: POSITIVE — AB

## 2019-02-15 LAB — SARS CORONAVIRUS 2 (TAT 6-24 HRS): SARS Coronavirus 2: NEGATIVE

## 2019-02-15 MED ORDER — ACETAMINOPHEN 500 MG PO TABS
1000.0000 mg | ORAL_TABLET | Freq: Four times a day (QID) | ORAL | Status: DC | PRN
  Administered 2019-02-17 – 2019-02-18 (×2): 1000 mg via ORAL
  Filled 2019-02-15 (×2): qty 2

## 2019-02-15 MED ORDER — POVIDONE-IODINE 10 % EX SWAB: 2.0000 "application " | Freq: Once | CUTANEOUS | Status: DC

## 2019-02-15 MED ORDER — INSULIN DETEMIR 100 UNIT/ML ~~LOC~~ SOLN
35.0000 [IU] | Freq: Every day | SUBCUTANEOUS | Status: DC
  Administered 2019-02-15: 10:00:00 35 [IU] via SUBCUTANEOUS
  Filled 2019-02-15 (×2): qty 0.35

## 2019-02-15 MED ORDER — TRANEXAMIC ACID-NACL 1000-0.7 MG/100ML-% IV SOLN: 1000.0000 mg | INTRAVENOUS | Status: AC

## 2019-02-15 MED ORDER — CEFAZOLIN SODIUM-DEXTROSE 2-4 GM/100ML-% IV SOLN: 2.0000 g | INTRAVENOUS | Status: DC

## 2019-02-15 MED ORDER — INSULIN ASPART 100 UNIT/ML ~~LOC~~ SOLN
20.0000 [IU] | Freq: Once | SUBCUTANEOUS | Status: AC
  Administered 2019-02-15: 01:00:00 20 [IU] via SUBCUTANEOUS

## 2019-02-15 MED ORDER — ENSURE PRE-SURGERY PO LIQD
296.0000 mL | Freq: Once | ORAL | Status: AC
  Administered 2019-02-16: 296 mL via ORAL
  Filled 2019-02-15: qty 296

## 2019-02-15 MED ORDER — INSULIN ASPART 100 UNIT/ML ~~LOC~~ SOLN
0.0000 [IU] | Freq: Three times a day (TID) | SUBCUTANEOUS | Status: DC
  Administered 2019-02-15: 13:00:00 8 [IU] via SUBCUTANEOUS
  Administered 2019-02-15: 5 [IU] via SUBCUTANEOUS
  Administered 2019-02-15: 09:00:00 8 [IU] via SUBCUTANEOUS
  Administered 2019-02-16: 5 [IU] via SUBCUTANEOUS
  Administered 2019-02-16: 09:00:00 15 [IU] via SUBCUTANEOUS
  Administered 2019-02-16 – 2019-02-17 (×2): 8 [IU] via SUBCUTANEOUS
  Administered 2019-02-17: 18:00:00 3 [IU] via SUBCUTANEOUS
  Administered 2019-02-17: 09:00:00 5 [IU] via SUBCUTANEOUS
  Administered 2019-02-18 (×2): 8 [IU] via SUBCUTANEOUS
  Administered 2019-02-18: 09:00:00 3 [IU] via SUBCUTANEOUS
  Administered 2019-02-19: 8 [IU] via SUBCUTANEOUS
  Administered 2019-02-19: 09:00:00 5 [IU] via SUBCUTANEOUS
  Administered 2019-02-19: 13:00:00 8 [IU] via SUBCUTANEOUS
  Administered 2019-02-20: 10:00:00 3 [IU] via SUBCUTANEOUS
  Administered 2019-02-20: 13:00:00 5 [IU] via SUBCUTANEOUS
  Administered 2019-02-20: 17:00:00 3 [IU] via SUBCUTANEOUS
  Administered 2019-02-21: 13:00:00 5 [IU] via SUBCUTANEOUS
  Administered 2019-02-21: 09:00:00 8 [IU] via SUBCUTANEOUS

## 2019-02-15 MED ORDER — FUROSEMIDE 10 MG/ML IJ SOLN
80.0000 mg | Freq: Three times a day (TID) | INTRAMUSCULAR | Status: DC
  Administered 2019-02-16 – 2019-02-17 (×5): 80 mg via INTRAVENOUS
  Filled 2019-02-15 (×5): qty 8

## 2019-02-15 MED ORDER — INSULIN ASPART 100 UNIT/ML ~~LOC~~ SOLN
0.0000 [IU] | Freq: Every day | SUBCUTANEOUS | Status: DC
  Administered 2019-02-16: 3 [IU] via SUBCUTANEOUS
  Administered 2019-02-16: 23:00:00 2 [IU] via SUBCUTANEOUS
  Administered 2019-02-17 – 2019-02-18 (×2): 3 [IU] via SUBCUTANEOUS

## 2019-02-15 MED ORDER — HYDROMORPHONE HCL 1 MG/ML IJ SOLN
0.5000 mg | Freq: Three times a day (TID) | INTRAMUSCULAR | Status: DC | PRN
  Administered 2019-02-16: 0.5 mg via INTRAVENOUS
  Filled 2019-02-15: qty 1

## 2019-02-15 MED ORDER — INSULIN ASPART 100 UNIT/ML ~~LOC~~ SOLN
4.0000 [IU] | Freq: Three times a day (TID) | SUBCUTANEOUS | Status: DC
  Administered 2019-02-15 (×3): 4 [IU] via SUBCUTANEOUS

## 2019-02-15 MED ORDER — CHLORHEXIDINE GLUCONATE 4 % EX LIQD
60.0000 mL | Freq: Once | CUTANEOUS | Status: AC
  Administered 2019-02-16: 4 via TOPICAL
  Filled 2019-02-15: qty 60

## 2019-02-15 NOTE — Plan of Care (Signed)
  Problem: Safety: Goal: Ability to remain free from injury will improve Outcome: Progressing   Problem: Skin Integrity: Goal: Risk for impaired skin integrity will decrease Outcome: Progressing   Problem: Activity: Goal: Ability to ambulate and perform ADLs will improve Outcome: Progressing   Problem: Pain Management: Goal: Pain level will decrease Outcome: Progressing

## 2019-02-15 NOTE — Progress Notes (Addendum)
Discussed patient with Dr. Algis Liming. Cardiology has seen her. Echo and repeat labs pending. Nephrology consult pending. Not ready for surgery yet. Tentatively scheduled for noon tomorrow. NPO after MN. Hold SQ heparin for now. May have to postpone.

## 2019-02-15 NOTE — Progress Notes (Signed)
Notified Dr Silas [place] of pt CBG of 458. Informed him that pt'sCBG was 391 at 2030 and was given 5 units Novolog and 15 units NPH per order. Will continue to monitor patient.

## 2019-02-15 NOTE — Progress Notes (Addendum)
PROGRESS NOTE   ELENE KRUMREY  [id number]    DOB: 04-17-1934    DOA: 02/14/2019  PCP: Jani Gravel, MD   I have briefly reviewed patients previous medical records in [facility].  Chief Complaint  Patient presents with  . Fall  . Hip Pain    Brief Narrative:  84 year old [name], lives with daughter, ambulates with help of walker, mostly sedentary, requires assistance for most ADLs, hard of hearing and refuses to wear hearing aid, PMH of NSTEMI 2012 when she presented with acute pulmonary edema, severe three-vessel CAD, s/p PCI, chronic diastolic CHF, stage III chronic kidney disease (unknown baseline), type II DM/IDDM on high doses of insulins, CVA, s/p left carotid stent, HLD, HTN, obesity, sustained mechanical fall at home 2 days PTA and presented to [facility] ED on 11/7 due to left hip pain and unable to weight-bear.  Admitted for acute right intertrochanteric hip fracture, orthopedics consulted and plan surgery possibly 02/16/2019.  Course complicated by acute on chronic kidney disease and anasarca, nephrology consulted.  Cardiology consulted for preop clearance.   Assessment & Plan:   Active Problems:   Hip fracture (HCC)   Acute right trochanteric hip fracture  Sustained status post mechanical fall.  Orthopedics consultation 11/7 appreciated and possibly for surgery 11/9.  Patient is high risk for perioperative CV events due to multiple medical problems including severe CAD, s/p PCI, chronic diastolic CHF, acute on chronic kidney disease, anasarca, poorly controlled DM 2, obesity.  However if she does not have surgery, her prognosis is even worse due to pain and immobility with associated complications.  Discussed all of this in detail with patient's daughter at bedside who verbalized understanding.  Requested cardiology for preop clearance.  Patient reports right shoulder pain, will get x-ray to rule out acute findings.  Acute on stage III chronic kidney disease/anasarca   Baseline creatinine is not known.  Hence cannot further subclassify her stage III CKD.  Last creatinine in [facility] is 1.28 in 2012.  Daughter reports that they saw a nephrologist for a.  Of time but has not seen one for a long time.  Requested medical records from PCP.  Presented with creatinine of 3.35 which improved slightly to 3.12 post hydration and holding Lasix and ARB.  Discussed in detail with Dr. Jonnie Finner, Nephrology who will consult.  IV fluids discontinued.  May need IV Lasix.  Etiology unclear.  Could be related to diabetic nephropathy and?  Nephrotic range proteinuria, CKD, CHF and hypoalbuminemia.  Follow daily BMP.  Also on sodium bicarbonate.  Acute on chronic diastolic CHF  Lacks respiratory symptoms but significant third spacing/volume overload.  TTE 03/07/2011: LVEF 55-60%, moderate LVH and mild posterolateral hypokinesis.  Diuretic management as indicated above.  ARB on hold due to acute kidney injury.  Cardiology consulted and await their input.  CAD, s/p PCI (CFX and LAD) in 2012 in the setting of NSTEMI & acute diastolic CHF  No anginal symptoms and patient quite sedentary at home.  Noncompliant with diet.  Has not seen cardiology since 01/2017.  Reportedly has an appointment tomorrow but will not be able to make it due to hospitalization.  Is on Brilinta without aspirin.  Continue atorvastatin, Imdur and metoprolol.  Poorly controlled type II DM with renal complications, obesity and hyperlipidemia.  Patient on high doses of N and R insulin at home, noncompliant with diet, does not check CBGs regularly.  Follows with Dr. Chalmers Cater, Endocrinology  A1c 7.5.  Changed her medications to  Levemir 35 units daily, NovoLog moderate sensitivity SSI and mealtime NovoLog 4 units 3 times daily.  Monitor CBGs closely and adjust insulins as needed.  Essential hypertension  Reasonably controlled on Imdur twice daily and metoprolol 25 mg twice daily.  Added as needed IV  hydralazine.  Hyperlipidemia  Continue atorvastatin.  PAD  History of left carotid stent remotely in [place].  Anemia in CKD  Hemoglobin likely at baseline.  Follow CBC closely.  Morbid obesity/There is no height or weight on file to calculate BMI.  Hard of hearing  Chest x-ray abnormality  Chest x-ray 11/7: Abnormal appearance of left hilum and left lung base of uncertain significance possibly related to chronic scarring and/or airspace disease.  Recommended PA and lateral x-rays, perform when able.  Asymptomatic bacteriuria   DVT prophylaxis: Subcutaneous heparin Code Status: Full Family Communication: Discussed in detail with patient's daughter at bedside, updated care and answered questions Disposition: To be determined pending clinical improvement and surgery   Consultants:  Orthopedics. Cardiology. Nephrology.  Procedures:  None  Antimicrobials:  None   Subjective: Patient is non-English speaking.  Daughter at bedside speaks English well and helped interpret and communicate.  Unwitnessed fall at home.  Patient reportedly tried to get off the bed and use her walker when the walker slid forward leading to fall.  Reportedly has had a couple of falls this year.  No reported history of head injury, LOC or bleeding.  Initially they did not feel that she had significant injury and hence did not come to the hospital but then noticed this worsening left hip pain.  She wears diapers at home.  Daughter attends to all her needs.  Eats frequently.  No CBG checks.  Mostly sedentary and walks very little.  No chest pain or dyspnea.  Also complains of right shoulder pain, especially on attempting to move.  Objective:  Vitals:   02/14/19 1800 02/14/19 1815 02/14/19 2008 02/15/19 0339  BP: (!) 156/80 (!) 164/111 (!) 146/58 (!) 120/52  Pulse: (!) 103 (!) 106 (!) 51 82  Resp: 17 17 18 18   Temp:   98.6 F (37 C) 99.2 F (37.3 C)  TempSrc:   Oral Oral  SpO2: 99% 97% 98% 95%     Examination:  General exam: Elderly female, moderately built and morbidly obese lying comfortably propped up in bed without distress. Respiratory system: Clear to auscultation.  No increased work of breathing. Cardiovascular system: S1 and S2 heard, RRR.  No JVD.  2/6 systolic murmur best heard at apex.  Trace bilateral leg edema but has pitting edema in the upper thighs, anterior abdominal wall and bilateral upper extremities. Gastrointestinal system: Abdomen is nondistended/obese, soft and nontender. No organomegaly or masses felt. Normal bowel sounds heard. Central nervous system: Alert and seems oriented. No focal neurological deficits. Extremities: Able to lift up left upper extremity and left lower extremity.  Right lower extremity shortened and externally rotated, movements restricted secondary to pain.  She is scared to move right shoulder due to pain but no external abnormal findings.  Neurovascular bundle intact. Skin: No rashes, lesions or ulcers Psychiatry: Judgement and insight unable to assess due to language barrier. Mood & affect flat.     Data Reviewed: I have personally reviewed following labs and imaging studies  CBC: Recent Labs  Lab 02/14/19 1436  WBC 11.2*  HGB 9.8*  HCT 31.3*  MCV 96.0  PLT 123456   Basic Metabolic Panel: Recent Labs  Lab 02/14/19 1436 02/15/19 0514  NA 140 143  K 4.7 4.9  CL 106 110  CO2 21* 23  GLUCOSE 412* 289*  BUN 65* 62*  CREATININE 3.35* 3.12*  CALCIUM 8.8* 8.6*   Liver Function Tests: No results for input(s): AST, ALT, ALKPHOS, BILITOT, PROT, ALBUMIN in the last 168 hours.  Cardiac Enzymes: No results for input(s): CKTOTAL, CKMB, CKMBINDEX, TROPONINI in the last 168 hours.  CBG: Recent Labs  Lab 02/14/19 2014 02/15/19 0016 02/15/19 0132 02/15/19 0414 02/15/19 0800  GLUCAP 391* 458* 397* 280* 276*    Recent Results (from the past 240 hour(s))  SARS CORONAVIRUS 2 (TAT 6-24 HRS) Nasopharyngeal Nasopharyngeal Swab      Status: None   Collection Time: 02/14/19  6:16 PM   Specimen: Nasopharyngeal Swab  Result Value Ref Range Status   SARS Coronavirus 2 NEGATIVE NEGATIVE Final    Comment: (NOTE) SARS-CoV-2 target nucleic acids are NOT DETECTED. The SARS-CoV-2 RNA is generally detectable in upper and lower respiratory specimens during the acute phase of infection. Negative results do not preclude SARS-CoV-2 infection, do not rule out co-infections with other pathogens, and should not be used as the sole basis for treatment or other patient management decisions. Negative results must be combined with clinical observations, patient history, and epidemiological information. The expected result is Negative. Fact Sheet for Patients: SugarRoll.be Fact Sheet for Healthcare Providers: https://www.woods-mathews.com/ This test is not yet approved or cleared by the [place] FDA and  has been authorized for detection and/or diagnosis of SARS-CoV-2 by FDA under an Emergency Use Authorization (EUA). This EUA will remain  in effect (meaning this test can be used) for the duration of the COVID-19 declaration under Section 56 4(b)(1) of the Act, 21 U.S.C. section 360bbb-3(b)(1), unless the authorization is terminated or revoked sooner. Performed at [facility], [place] [address] [place], [place] [address]   Surgical pcr screen     Status: Abnormal   Collection Time: 02/14/19  9:01 PM   Specimen: Nasal Mucosa; Nasal Swab  Result Value Ref Range Status   MRSA, PCR NEGATIVE NEGATIVE Final   Staphylococcus aureus POSITIVE (A) NEGATIVE Final    Comment: (NOTE) The Xpert SA Assay (FDA approved for NASAL specimens in patients 2 years of age and older), is one component of a comprehensive surveillance program. It is not intended to diagnose infection nor to guide or monitor treatment. Performed at [facility], [place] [address], [place], [place] [address]           Radiology Studies: Dg Chest 1 View  Result Date: 02/14/2019 CLINICAL DATA:  Hip fracture, preoperative EXAM: CHEST  1 VIEW COMPARISON:  12/27/2011 FINDINGS: Cardiomegaly. Abnormal appearance of the left hilum and left lung base, of uncertain significance, possibly related to chronic scarring and/or airspace disease. The visualized skeletal structures are unremarkable. IMPRESSION: 1. Cardiomegaly. 2. Abnormal appearance of the left hilum and left lung base, of uncertain significance, possibly related to chronic scarring and/or airspace disease. Recommend PA and lateral radiographs to further evaluate. Electronically Signed   By: Eddie Candle M.D.   On: 02/14/2019 16:16   Dg Knee Right Port  Result Date: 02/14/2019 CLINICAL DATA:  Hip fracture EXAM: PORTABLE RIGHT KNEE - 1-2 VIEW COMPARISON:  None. FINDINGS: No fracture or dislocation of the right knee. There is moderate tricompartmental joint space narrowing and osteophytosis. No knee joint effusion. Vascular calcinosis. IMPRESSION: No fracture or dislocation of the right knee. There is moderate tricompartmental joint space narrowing and osteophytosis. No knee  joint effusion. Electronically Signed   By: Eddie Candle M.D.   On: 02/14/2019 18:49   Dg Hip Unilat  With Pelvis 2-3 Views Right  Result Date: 02/14/2019 CLINICAL DATA:  Right hip pain after fall. EXAM: DG HIP (WITH OR WITHOUT PELVIS) 2-3V RIGHT COMPARISON:  None. FINDINGS: Acute impacted right intertrochanteric femur fracture with varus angulation. No dislocation. The hip joint spaces are relatively preserved. The pubic symphysis and sacroiliac joints are intact. Osteopenia. Vascular calcifications. IMPRESSION: 1. Acute impacted right intertrochanteric femur fracture. Electronically Signed   By: Titus Dubin M.D.   On: 02/14/2019 15:09        Scheduled Meds: . atorvastatin  40 mg Oral QHS  . calcitRIOL  0.25 mcg Oral QODAY  . heparin  5,000 Units Subcutaneous BID  . insulin  aspart  0-15 Units Subcutaneous TID WC  . insulin aspart  0-5 Units Subcutaneous QHS  . insulin aspart  4 Units Subcutaneous TID WC  . insulin detemir  35 Units Subcutaneous Daily  . isosorbide mononitrate  30 mg Oral Q breakfast  . isosorbide mononitrate  60 mg Oral QPC lunch  . metoprolol tartrate  25 mg Oral BID  . pantoprazole  40 mg Oral Daily  . sodium bicarbonate  325 mg Oral Q48H   Continuous Infusions:   LOS: 1 day     Vernell Leep, MD, FACP, [facility]. [facility]  To contact the attending provider between 7A-7P or the covering provider during after hours 7P-7A, please log into the web site [email or web address] and access using universal [place] password for that web site. If you do not have the password, please call the hospital operator.  02/15/2019, 10:34 AM

## 2019-02-15 NOTE — Consult Note (Signed)
Renal Service Consult Note [facility]  Danielle [name] 02/15/2019 Danielle [name] Requesting Physician:  Dr Algis Liming  Reason for Consult:  Renal failure HPI: The patient is a 84 y.o. year-old with hx of CAD w/ stents, HTN, CKD3/4, HL, CVA, obesity presented on 11/7 after a fall 2 days prior w/ hip pain in the R side.  ED eval showed R hip fracture.  Labs showed creat 3.35 (baseline creat from 2012 was 1.2- 1.8). Ortho consulted. Asked to see for renal failure.   Pt's family acts as Astronomer.  She denies any knowledge of kidney problems, pt's PCP is Danielle Gravel MD.  Patient deneis any current issues such as SOB, cough, fevers, abd pain or chest pain.  R hip hurts.   Per family pt lives at home w/ her family, she walks short distances w/ her walker at home.  Has been very thirsty and drinking a lot of water.   ROS  denies CP  no joint pain   no HA  no blurry vision  no rash  no diarrhea  no nausea/ vomiting  no dysuria  no difficulty voiding  no change in urine color    Past Medical History  Past Medical History:  Diagnosis Date  . CAD S/P percutaneous coronary angioplasty November 2012   a)Severe 3 Vessel, s/p PCI with Resolute DES to LAD x1, LCx/OM x2, diffise distal LAD & entire RCA disease - not PCI amenable;; b) Myoview 12/2013: EF 67%,OINTERMEDIATE RISK - Inferior Ischemia c/w known anatomy (RCA CAD that is not PCI amenable)     . Chronic diastolic heart failure, NYHA class 2-3    Grade 1 Diastolic Dysfunction by echo  . CKD (chronic kidney disease), stage III   . Diabetes mellitus, type II, insulin dependent ([place])     Difficult to control; in obese, with CAD and PAD complications   . History of Non-ST elevated myocardial infarction (non-STEMI) 03/06/2011   Presentation was acute diastolic heart failure with edema and pulmonary edema.  Symptom was dyspnea but not specifically anginal chest pressure.  Marland Kitchen History of seasonal allergies   . History of stroke with  residual effects Unsure   Old left-sided CVA --> S/P Left Carotid STENT in [place];   . History of stroke without residual deficits August 2011   Right Internal Capsule Stroke, nonhemorrhagic  . Hyperlipidemia   . Hypertension   . Obesity (BMI 30-39.9)   . Osteoarthritis    Past Surgical History  Past Surgical History:  Procedure Laterality Date  . CAROTID STENT Left    Performed in [place]  . CHOLECYSTECTOMY    . CORONARY ANGIOPLASTY WITH STENT PLACEMENT  03/16/11   LAD x1 (3.0 mm 20 mm (postdilated - 3.3 mm) Resolute DES, LCx/OM1 x2; distal 2.5 mm x 14 mm and proximal 3.0 mm at 26 mm Resolute DES; post-dilation 2.9 distal, 3.25 proximal  . DOPPLER ECHOCARDIOGRAPHY  03/07/2011   EF 55-60%; moderate concentric LVH, mild posterolateral hypokinesis.  Grade 1 Diastolic Dysfunction with high filling pressures.  . INTRAVASCULAR ULTRASOUND  03/12/2011   Procedure: INTRAVASCULAR ULTRASOUND;  Surgeon: Leonie Man, MD;  Location: [facility] CATH LAB;  Service: Cardiovascular;;  . LEFT HEART CATHETERIZATION WITH CORONARY ANGIOGRAM N/A 03/12/2011   Procedure: LEFT HEART CATHETERIZATION WITH CORONARY ANGIOGRAM;  Surgeon: Leonie Man, MD;  Location: [facility] CATH LAB;  Service: Cardiovascular;  Laterality: N/A;  . NM MYOVIEW LTD  September 2015   EF 67%,OINTERMEDIATE RISK - Inferior Ischemia c/w known anatomy (  RCA CAD that is not PCI amenable)     . PERCUTANEOUS CORONARY STENT INTERVENTION (PCI-S) N/A 03/16/2011   Procedure: PERCUTANEOUS CORONARY STENT INTERVENTION (PCI-S);  Surgeon: Troy Sine, MD;  Location[facility] CATH LAB;  Service: Cardiovascular;  Laterality: N/A;   Family History  Family History  Family history unknown: Yes   Social History  reports that she has never smoked. She has never used smokeless tobacco. She reports that she does not drink alcohol or use drugs. Allergies  Allergies  Allergen Reactions  . Lisinopril Cough   Home medications Prior to Admission medications   Medication Sig  Start Date End Date Taking? Authorizing Provider  acetaminophen (TYLENOL) 500 MG tablet Take 1,000 mg by mouth every 6 (six) hours as needed (pain).   Yes [provider]  amitriptyline (ELAVIL) 25 MG tablet Take 25 mg by mouth at bedtime.   Yes [provider]  atorvastatin (LIPITOR) 40 MG tablet TAKE 1 TABLET BY MOUTH AT BEDTIME Patient taking differently: Take 40 mg by mouth daily after supper.  07/04/18  Yes Leonie Man, MD  calcitRIOL (ROCALTROL) 0.25 MCG capsule Take 0.25 mcg by mouth every other day.   Yes [provider]  Coenzyme Q10 (COQ10 PO) Take 1 capsule by mouth daily with lunch.   Yes [provider]  Cyanocobalamin (VITAMIN B-12 SL) Place 100 mcg under the tongue daily with lunch.   Yes [provider]  denosumab (PROLIA) 60 MG/ML SOSY injection Inject 60 mg into the skin every 6 (six) months.   Yes [provider]  diclofenac sodium (VOLTAREN) 1 % GEL Apply 1 application topically 3 (three) times daily as needed (pain).   Yes [provider]  Dulaglutide 1.5 MG/0.5ML SOPN Inject 1.5 mg into the skin every Monday. Trulicity   Yes [provider]  furosemide (LASIX) 40 MG tablet TAKE 1 TABLET BY MOUTH THREE TIMES DAILY Patient taking differently: Take 20-40 mg by mouth See admin instructions. Take one tablet (40 mg) by mouth 1st day in the morning, then take 1/2 tablet (20 mg) 2nd day in the morning, then repeat 09/15/18  Yes Leonie Man, MD  Icosapent Ethyl (VASCEPA) 0.5 g CAPS Take 1 g by mouth 2 (two) times daily after a meal.   Yes [provider]  insulin NPH (HUMULIN N,NOVOLIN N) 100 UNIT/ML injection Inject 55 Units into the skin 2 (two) times daily before a meal.    Yes [provider]  insulin regular (NOVOLIN R,HUMULIN R) 100 units/mL injection Inject 40 Units into the skin 3 (three) times daily before meals.    Yes [provider]  isosorbide mononitrate (IMDUR) 60 MG  24 hr tablet TAKE 1 AND 1/2 TABLETS(90 MG) BY MOUTH DAILY Patient taking differently: Take 30-60 mg by mouth See admin instructions. Take 1/2 tablet (30 mg) by mouth daily after breakfast, take 1 tablet (60 mg) after lunch ** DO NOT CRUSH ** 04/16/18  Yes Leonie Man, MD  losartan (COZAAR) 50 MG tablet Take 50 mg by mouth daily after breakfast.    Yes [provider]  metoprolol tartrate (LOPRESSOR) 25 MG tablet TAKE 1 TABLET BY MOUTH 2 TIMES DAILY Patient taking differently: Take 25 mg by mouth 2 (two) times daily after a meal.  01/15/19  Yes Leonie Man, MD  pantoprazole (PROTONIX) 40 MG tablet TAKE 1 TABLET BY MOUTH EVERY DAY Patient taking differently: Take 40 mg by mouth daily with lunch.  09/02/18  Yes  Leonie Man, MD  POLY-IRON 150 150 MG capsule TAKE ONE CAPSULE BY MOUTH EVERY OTHER DAY. Patient taking differently: Take 150 mg by mouth See admin instructions. Take one capsule (150 mg) by mouth every other day after lunch 09/15/18  Yes Leonie Man, MD  sodium bicarbonate 325 MG tablet Take 162.5 mg by mouth daily after breakfast.  01/15/19  Yes [provider]  ticagrelor (BRILINTA) 90 MG TABS tablet Take 1 tablet (90 mg total) by mouth 2 (two) times daily. NEEDS APPOINTMENT FOR FUTURE REFILLS Patient taking differently: Take 90 mg by mouth 2 (two) times daily after a meal. NEEDS APPOINTMENT FOR FUTURE REFILLS 02/13/19  Yes Leonie Man, MD  ACCU-CHEK COMPACT PLUS test strip  10/18/12   [provider]  B-D INS SYR ULTRAFINE 1CC/31G 31G X 5/16" 1 ML MISC Use as directed 09/16/14   [provider]   Liver Function Tests Recent Labs  Lab 02/15/19 0514  AST 17  ALT 11  ALKPHOS 169*  BILITOT 0.5  PROT 7.6  ALBUMIN 3.0*   No results for input(s): LIPASE, AMYLASE in the last 168 hours. CBC Recent Labs  Lab 02/14/19 1436  WBC 11.2*  HGB 9.8*  HCT 31.3*  MCV 96.0  PLT 532   Basic Metabolic Panel Recent Labs  Lab 02/14/19 1436  02/15/19 0514  NA 140 143  K 4.7 4.9  CL 106 110  CO2 21* 23  GLUCOSE 412* 289*  BUN 65* 62*  CREATININE 3.35* 3.12*  CALCIUM 8.8* 8.6*   Iron/TIBC/Ferritin/ %Sat    Component Value Date/Time   IRON 16 (L) 03/08/2011 1117   TIBC 262 03/08/2011 1117   FERRITIN 189 03/08/2011 1117   IRONPCTSAT 6 (L) 03/08/2011 1117    Vitals:   02/14/19 2008 02/15/19 0339 02/15/19 1230 02/15/19 1600  BP: (!) 146/58 (!) 120/52 (!) 115/54 136/61  Pulse: (!) 51 82 74 86  Resp: 18 18 18 16   Temp: 98.6 F (37 C) 99.2 F (37.3 C) 99.4 F (37.4 C) 99.6 F (37.6 C)  TempSrc: Oral Oral Oral Oral  SpO2: 98% 95% 94% 97%    Exam Gen lying flat elderly heavy Asian female no distress No rash, cyanosis or gangrene Sclera anicteric, throat clear  +JVD Chest bilat basilar rales post RRR no MRG Abd soft ntnd no mass or ascites +bs GU purewick in place MS no joint effusions or deformity Ext diffuse 2+ doughy UE edema and 2+ pitting bilat LE edema Neuro is lethargic but arouses easily and converses w/ her family member    Date  Creat  eGFR   2011  1.12- 1.50 42- 48   2012  1.20- 1.92 25- 43 ml/min   02/14/19 3.35  12    02/15/19 3.12  13    Home meds:  - furosemide 20- 40 qd/ losartan 50 hs/ metoprolol 25 bid  - insulin NPH 15 bid/ insulin regular 40u tid/ dulaglutide 1.74m weekly  - isosorbide mononitrate 30 am+ 60 pm/ atorvastatin 40 hs  - amitriptyline 25 hs  - pantoprazole 40  - prn's/ vitamins/ supplements    CXR 11/7 > IMPRESSION: 1. Cardiomegaly    2. Abnormal appearance of the left hilum and left lung base, of uncertain significance, possibly related to chronic scarring and/or airspace disease. Recommend PA and lateral radiographs to further evaluate.    UA 11/7 > cloudy, 30 protein, 0-5 rbc/ 21-50wbc, many bact   PCP JJani GravelMD ([phone], should have labs from  4 mos ago   Assessment/ Plan: 1. Renal failure - had CKD III back in 2012 when creat was around 1.3- 1.6. Now  here for fall and hip fx and creat is 3.12.  Family says her PCP drew labs about 4 mos ago, will try to get these tomorrow. May have AKI from decomp CHF vs progression of CKD to stage 4/5.  Pt is significantly vol overloaded /w diffuse peripheral edema and pulm edema which appears minimally symptomatic. UA unremarkable, get renal US and urine lytes and start IV lasix to diurese.  Agree not stable for surgery at this time.  2. Fall / R hip fracture 3. HTN - bp's normal here, on 2 bp meds at home, will hold losartan for now. Cont metoprolol, may add norvasc/ hydralazine if needed.  4. DM2 on insulin 5. Vol overload - CHF and/or renal failure related. Get echo, attempt to diurese.       Kelly Splinter  MD 02/15/2019, 8:24 PM

## 2019-02-15 NOTE — Consult Note (Addendum)
Cardiology Consultation:   Patient ID: Danielle [name]; [id number]; 06-Aug-1934   Admit date: 02/14/2019 Date of Consult: 02/15/2019  Primary Care Provider: Jani Gravel, MD Primary Cardiologist: Glenetta Hew, MD Primary Electrophysiologist:  None  Chief Complaint: hip pain  Patient Profile:   Danielle [name] is a 84 y.o. female with a hx of CAD as outlined below, chronic diastolic CHF, CKD stage III-IV, DM, stroke s/p L carotid stent in [place], HTN, HLD, obesity, chronic anemia (Hgb 10.5 in 2019) hard of hearing who is being seen today for the evaluation of pre-op clearance at the request of Dr. Algis Liming.  History of Present Illness:   Danielle [name] has a prior history of NSTEMI in the presentation of acute combined CHF in November 2012 and underwent cath with staged PCI to LAD, LCx-OM with 2 stents. She had a diffusely diseased RCA that was not re-vascularizeable via PCI or CABG  She had a nuclear stress test in 2015 that was intermediate risk showing inferior wall ischemia consistent with known RCA disease. Invasive evaluation has been deferred in light of stable coronary status. Last Cr in 02/2018 was 1.9 per scanned labs (has been variable up to 2.5 as well), so also with CKD at baseline.   She presented to the ED with a fall that had occurred 2 days prior with significant hip pain. She fell off the couch. She was unable to weight bear but refused to come to the hospital at first. She also did not want to eat, drink or take her medications over that span of time. Family noted she was more sleepy than usual. She finally agreed to go due to the pain. Imaging demonstrated an acute right intertrochanteric hip fracture. She is also noted to have AKI with Cr up to 3.35, hyperglycemia >400 with glycosuria, mild leukocytosis, and anemia with Hgb 9.8 (previously 10.5 in 2019). She is felt to have some third spacing on exam. Cardiology is consulted for preop clearance. I assisted with preparation of the  documentation and Dr. Johnsie Cancel personally interviewed the patient and her daughter.  Past Medical History:  Diagnosis Date  . CAD S/P percutaneous coronary angioplasty November 2012   a)Severe 3 Vessel, s/p PCI with Resolute DES to LAD x1, LCx/OM x2, diffise distal LAD & entire RCA disease - not PCI amenable;; b) Myoview 12/2013: EF 67%,OINTERMEDIATE RISK - Inferior Ischemia c/w known anatomy (RCA CAD that is not PCI amenable)     . Chronic diastolic heart failure, NYHA class 2-3    Grade 1 Diastolic Dysfunction by echo  . CKD (chronic kidney disease), stage III   . Diabetes mellitus, type II, insulin dependent ([place])     Difficult to control; in obese, with CAD and PAD complications   . History of Non-ST elevated myocardial infarction (non-STEMI) 03/06/2011   Presentation was acute diastolic heart failure with edema and pulmonary edema.  Symptom was dyspnea but not specifically anginal chest pressure.  Marland Kitchen History of seasonal allergies   . History of stroke with residual effects Unsure   Old left-sided CVA --> S/P Left Carotid STENT in [place];   . History of stroke without residual deficits August 2011   Right Internal Capsule Stroke, nonhemorrhagic  . Hyperlipidemia   . Hypertension   . Obesity (BMI 30-39.9)   . Osteoarthritis     Past Surgical History:  Procedure Laterality Date  . CAROTID STENT Left    Performed in [place]  . CHOLECYSTECTOMY    . CORONARY ANGIOPLASTY WITH  STENT PLACEMENT  03/16/11   LAD x1 (3.0 mm 20 mm (postdilated - 3.3 mm) Resolute DES, LCx/OM1 x2; distal 2.5 mm x 14 mm and proximal 3.0 mm at 26 mm Resolute DES; post-dilation 2.9 distal, 3.25 proximal  . DOPPLER ECHOCARDIOGRAPHY  03/07/2011   EF 55-60%; moderate concentric LVH, mild posterolateral hypokinesis.  Grade 1 Diastolic Dysfunction with high filling pressures.  . INTRAVASCULAR ULTRASOUND  03/12/2011   Procedure: INTRAVASCULAR ULTRASOUND;  Surgeon: Leonie Man, MD;  Location: [facility] CATH LAB;  Service:  Cardiovascular;;  . LEFT HEART CATHETERIZATION WITH CORONARY ANGIOGRAM N/A 03/12/2011   Procedure: LEFT HEART CATHETERIZATION WITH CORONARY ANGIOGRAM;  Surgeon: Leonie Man, MD;  Location: [facility] CATH LAB;  Service: Cardiovascular;  Laterality: N/A;  . NM MYOVIEW LTD  September 2015   EF 67%,OINTERMEDIATE RISK - Inferior Ischemia c/w known anatomy (RCA CAD that is not PCI amenable)     . PERCUTANEOUS CORONARY STENT INTERVENTION (PCI-S) N/A 03/16/2011   Procedure: PERCUTANEOUS CORONARY STENT INTERVENTION (PCI-S);  Surgeon: Troy Sine, MD;  Location: [facility] CATH LAB;  Service: Cardiovascular;  Laterality: N/A;     Inpatient Medications: Scheduled Meds: . atorvastatin  40 mg Oral QHS  . calcitRIOL  0.25 mcg Oral QODAY  . heparin  5,000 Units Subcutaneous BID  . insulin aspart  0-15 Units Subcutaneous TID WC  . insulin aspart  0-5 Units Subcutaneous QHS  . insulin aspart  4 Units Subcutaneous TID WC  . insulin detemir  35 Units Subcutaneous Daily  . isosorbide mononitrate  30 mg Oral Q breakfast  . isosorbide mononitrate  60 mg Oral QPC lunch  . metoprolol tartrate  25 mg Oral BID  . pantoprazole  40 mg Oral Daily  . sodium bicarbonate  325 mg Oral Q48H   Continuous Infusions:  PRN Meds: acetaminophen, morphine injection, ondansetron **OR** ondansetron (ZOFRAN) IV, oxyCODONE  Home Meds: Prior to Admission medications   Medication Sig Start Date End Date Taking? Authorizing Provider  acetaminophen (TYLENOL) 500 MG tablet Take 1,000 mg by mouth every 6 (six) hours as needed (pain).   Yes [provider]  amitriptyline (ELAVIL) 25 MG tablet Take 25 mg by mouth at bedtime.   Yes [provider]  atorvastatin (LIPITOR) 40 MG tablet TAKE 1 TABLET BY MOUTH AT BEDTIME Patient taking differently: Take 40 mg by mouth daily after supper.  07/04/18  Yes Leonie Man, MD  calcitRIOL (ROCALTROL) 0.25 MCG capsule Take 0.25 mcg by mouth every other day.   Yes [provider]  Coenzyme Q10 (COQ10 PO) Take 1 capsule by mouth daily with lunch.   Yes [provider]  Cyanocobalamin (VITAMIN B-12 SL) Place 100 mcg under the tongue daily with lunch.   Yes [provider]  denosumab (PROLIA) 60 MG/ML SOSY injection Inject 60 mg into the skin every 6 (six) months.   Yes [provider]  diclofenac sodium (VOLTAREN) 1 % GEL Apply 1 application topically 3 (three) times daily as needed (pain).   Yes [provider]  Dulaglutide 1.5 MG/0.5ML SOPN Inject 1.5 mg into the skin every Monday. Trulicity   Yes [provider]  furosemide (LASIX) 40 MG tablet TAKE 1 TABLET BY MOUTH THREE TIMES DAILY Patient taking differently: Take 20-40 mg by mouth See admin instructions. Take one tablet (40 mg) by mouth 1st day in the morning, then take 1/2 tablet (20 mg) 2nd day in the morning, then repeat 09/15/18  Yes Leonie Man, MD  Icosapent  Ethyl (VASCEPA) 0.5 g CAPS Take 1 g by mouth 2 (two) times daily after a meal.   Yes [provider]  insulin NPH (HUMULIN N,NOVOLIN N) 100 UNIT/ML injection Inject 55 Units into the skin 2 (two) times daily before a meal.    Yes [provider]  insulin regular (NOVOLIN R,HUMULIN R) 100 units/mL injection Inject 40 Units into the skin 3 (three) times daily before meals.    Yes [provider]  isosorbide mononitrate (IMDUR) 60 MG 24 hr tablet TAKE 1 AND 1/2 TABLETS(90 MG) BY MOUTH DAILY Patient taking differently: Take 30-60 mg by mouth See admin instructions. Take 1/2 tablet (30 mg) by mouth daily after breakfast, take 1 tablet (60 mg) after lunch ** DO NOT CRUSH ** 04/16/18  Yes Leonie Man, MD  losartan (COZAAR) 50 MG tablet Take 50 mg by mouth daily after breakfast.    Yes [provider]  metoprolol tartrate (LOPRESSOR) 25 MG tablet TAKE 1 TABLET BY MOUTH 2 TIMES DAILY Patient taking differently: Take 25 mg by mouth 2 (two) times daily after a meal.  01/15/19  Yes  Leonie Man, MD  pantoprazole (PROTONIX) 40 MG tablet TAKE 1 TABLET BY MOUTH EVERY DAY Patient taking differently: Take 40 mg by mouth daily with lunch.  09/02/18  Yes Leonie Man, MD  POLY-IRON 150 150 MG capsule TAKE ONE CAPSULE BY MOUTH EVERY OTHER DAY. Patient taking differently: Take 150 mg by mouth See admin instructions. Take one capsule (150 mg) by mouth every other day after lunch 09/15/18  Yes Leonie Man, MD  sodium bicarbonate 325 MG tablet Take 162.5 mg by mouth daily after breakfast.  01/15/19  Yes [provider]  ticagrelor (BRILINTA) 90 MG TABS tablet Take 1 tablet (90 mg total) by mouth 2 (two) times daily. NEEDS APPOINTMENT FOR FUTURE REFILLS Patient taking differently: Take 90 mg by mouth 2 (two) times daily after a meal. NEEDS APPOINTMENT FOR FUTURE REFILLS 02/13/19  Yes Leonie Man, MD  ACCU-CHEK COMPACT PLUS test strip  10/18/12   [provider]  B-D INS SYR ULTRAFINE 1CC/31G 31G X 5/16" 1 ML MISC Use as directed 09/16/14   [provider]    Allergies:    Allergies  Allergen Reactions  . Lisinopril Cough    Social History:   Social History   Socioeconomic History  . Marital status: Married    Spouse name: Not on file  . Number of children: Not on file  . Years of education: Not on file  . Highest education level: Not on file  Occupational History  . Not on file  Social Needs  . Financial resource strain: Not on file  . Food insecurity    Worry: Not on file    Inability: Not on file  . Transportation needs    Medical: Not on file    Non-medical: Not on file  Tobacco Use  . Smoking status: Never Smoker  . Smokeless tobacco: Never Used  Substance and Sexual Activity  . Alcohol use: No  . Drug use: No  . Sexual activity: Never  Lifestyle  . Physical activity    Days per week: Not on file    Minutes per session: Not on file  . Stress: Not on file  Relationships  . Social Herbalist on phone: Not on  file    Gets together: Not on file    Attends religious service: Not on file  Active member of club or organization: Not on file    Attends meetings of clubs or organizations: Not on file    Relationship status: Not on file  . Intimate partner violence    Fear of current or ex partner: Not on file    Emotionally abused: Not on file    Physically abused: Not on file    Forced sexual activity: Not on file  Other Topics Concern  . Not on file  Social History Narrative   She is a married, mother of 35 with 6 grandchildren.  She does not smoke or drink.  She is essentially sedentary, does not do much at all besides around the house.  She does range of motion exercises for a few minutes in the morning, but does not get routine walking or aerobic exercise.  She speaks no [place], but her daughter is essentially her primary care provider both in the house as well as for clinic visits.     Family History:  Unknown to the patient Family History  Family history unknown: Yes    ROS:  Please see the history of present illness.  All other ROS reviewed and negative.     Physical Exam/Data:   Vitals:   02/14/19 1800 02/14/19 1815 02/14/19 2008 02/15/19 0339  BP: (!) 156/80 (!) 164/111 (!) 146/58 (!) 120/52  Pulse: (!) 103 (!) 106 (!) 51 82  Resp: 17 17 18 18   Temp:   98.6 F (37 C) 99.2 F (37.3 C)  TempSrc:   Oral Oral  SpO2: 99% 97% 98% 95%    Intake/Output Summary (Last 24 hours) at 02/15/2019 1022 Last data filed at 02/15/2019 0339 Gross per 24 hour  Intake 1240 ml  Output 700 ml  Net 540 ml   Last 3 Weights 01/22/2017 09/14/2016 01/30/2016  Weight (lbs) 189 lb 3.2 oz 185 lb 185 lb 9.6 oz  Weight (kg) 85.821 kg 83.915 kg 84.188 kg     There is no height or weight on file to calculate BMI.   Affect appropriate Overweight [name]  HEENT: normal Neck supple with no adenopathy JVP normal left  bruits no thyromegaly Lungs clear with no wheezing and good diaphragmatic motion  Heart:  S1/S2 no murmur, no rub, gallop or click PMI normal Abdomen: benighn, BS positve, no tenderness, no AAA no bruit.  No HSM or HJR Distal pulses intact with no bruits No edema Neuro non-focal Skin warm and dry No muscular weakness Fracture right hip     EKG:  The EKG was personally reviewed and demonstrates:  Sinus tachycardia 102bpm with rightward axis, nonspecific subtle ST depression II, III, avF, V5-V6   Relevant CV Studies: 2D echo 2012 Study Conclusions   - Left ventricle: The cavity size was normal. Wall thickness  was increased in a pattern of moderate LVH. There was  moderate concentric hypertrophy. Systolic function was  normal. The estimated ejection fraction was in the range  of 55% to 60%. Possible mild posterolateral hypokinesis  cannot be excluded. Doppler parameters are consistent with  abnormal left ventricular relaxation (grade 1 diastolic  dysfunction). Doppler parameters are consistent with  elevated mean left atrial filling pressure.  - Mitral valve: Moderately to severely calcified posterior  MV annulus. Mild regurgitation.  - Right ventricle: Systolic pressure was increased.  - Pulmonary arteries: PA peak pressure: 65mm Hg (S).  Impressions:   - The right ventricular systolic pressure was increased  consistent with mild pulmonary hypertension.  Transthoracic echocardiography. M-mode, complete  2D,  spectral Doppler, and color Doppler. Height: Height:  154.9cm. Height: 61in. Weight: Weight: 88.2kg. Weight:  194lb. Body mass index: BMI: 36.7kg/m^2. Body surface  area:  BSA: 1.34m^2. Blood pressure:   134/63. Patient  status: Inpatient. Location: ICU/CCU   Laboratory Data:   Chemistry Recent Labs  Lab 02/14/19 1436 02/15/19 0514  NA 140 143  K 4.7 4.9  CL 106 110  CO2 21* 23  GLUCOSE 412* 289*  BUN 65* 62*  CREATININE 3.35* 3.12*  CALCIUM 8.8* 8.6*  GFRNONAA 12* 13*  GFRAA 14* 15*  ANIONGAP 13  10    Hematology Recent Labs  Lab 02/14/19 1436  WBC 11.2*  RBC 3.26*  HGB 9.8*  HCT 31.3*  MCV 96.0  MCH 30.1  MCHC 31.3  RDW 14.1  PLT 154   BNPNo results for input(s): BNP, PROBNP in the last 168 hours.  DDimer No results for input(s): DDIMER in the last 168 hours.   Radiology/Studies:  Dg Chest 1 View  Result Date: 02/14/2019 CLINICAL DATA:  Hip fracture, preoperative EXAM: CHEST  1 VIEW COMPARISON:  12/27/2011 FINDINGS: Cardiomegaly. Abnormal appearance of the left hilum and left lung base, of uncertain significance, possibly related to chronic scarring and/or airspace disease. The visualized skeletal structures are unremarkable. IMPRESSION: 1. Cardiomegaly. 2. Abnormal appearance of the left hilum and left lung base, of uncertain significance, possibly related to chronic scarring and/or airspace disease. Recommend PA and lateral radiographs to further evaluate. Electronically Signed   By: Eddie Candle M.D.   On: 02/14/2019 16:16   Dg Knee Right Port  Result Date: 02/14/2019 CLINICAL DATA:  Hip fracture EXAM: PORTABLE RIGHT KNEE - 1-2 VIEW COMPARISON:  None. FINDINGS: No fracture or dislocation of the right knee. There is moderate tricompartmental joint space narrowing and osteophytosis. No knee joint effusion. Vascular calcinosis. IMPRESSION: No fracture or dislocation of the right knee. There is moderate tricompartmental joint space narrowing and osteophytosis. No knee joint effusion. Electronically Signed   By: Eddie Candle M.D.   On: 02/14/2019 18:49   Dg Hip Unilat  With Pelvis 2-3 Views Right  Result Date: 02/14/2019 CLINICAL DATA:  Right hip pain after fall. EXAM: DG HIP (WITH OR WITHOUT PELVIS) 2-3V RIGHT COMPARISON:  None. FINDINGS: Acute impacted right intertrochanteric femur fracture with varus angulation. No dislocation. The hip joint spaces are relatively preserved. The pubic symphysis and sacroiliac joints are intact. Osteopenia. Vascular calcifications. IMPRESSION:  1. Acute impacted right intertrochanteric femur fracture. Electronically Signed   By: Titus Dubin M.D.   On: 02/14/2019 15:09    Assessment and Plan:   1. Mechanical fall with resultant R hip fracture 2. Known CAD as outlined above 3. Chronic diastolic CHF 4. AKI on CKD stage III-IV (prior values 1.9-2.5 in scanned labs) 5. Chronic appearing anemia  Dr. Johnsie Cancel spoke with patient/family - please see below for input on pre-operative clearance. Patient had not had her meds for 2 days prior to admission. Brilinta remains on hold at this time.  For questions or updates, please contact [place] Please consult [email or web address] for contact info under     Signed, Charlie Pitter, PA-C  02/15/2019 10:22 AM   Patient examined chart reviewed discussed care with patient but more importantly both daughters who interpret for her She has very poor functional status at baseline She only leaves house once/week for church Only ambulates with walker around house. No chest pain or dyspnea at baseline Had SEMI in 2012 and turned down for surgery  by Dr Cyndia Bent Had stenting of LAD and OM with diffuse collateralized RCA disease. F/U myovue in 2015 showed inferior ischemia Rx medically due to known anatomy with diffuse disease and collaterals not being favorable to intervention. She has not had active CHF. Also had had left carotid stent Last known EF by echo normal in 2012 Exam with left bruit distant heart sounds obese with right hip fracture. CXR with abnormal left hilum recommended PA / Lateral CRF with worsening Cr this admission 3.35-> 3.12.  Patient tentatively schedule for surgery Monday I think she needs to have echo to assess EF, PA and Lateral CXR and stabilized Cr before proceeding. Family willing to take risk of ischemic event as patient in a lot of pain and would not be able to ambulate again without surgery They understand high risk of surgery I have written for echo and PA/Lateral CXR and BMET in am  Consider nephrology consult and stabilization of renal function before general anesthesia and surgery Physical exam amended based on my findings   Jenkins Rouge

## 2019-02-16 ENCOUNTER — Inpatient Hospital Stay (HOSPITAL_COMMUNITY): Payer: Medicare Other

## 2019-02-16 ENCOUNTER — Ambulatory Visit: Payer: Medicare Other | Admitting: Cardiology

## 2019-02-16 ENCOUNTER — Encounter (HOSPITAL_COMMUNITY): Payer: Self-pay

## 2019-02-16 DIAGNOSIS — I2510 Atherosclerotic heart disease of native coronary artery without angina pectoris: Secondary | ICD-10-CM

## 2019-02-16 DIAGNOSIS — S72144A Nondisplaced intertrochanteric fracture of right femur, initial encounter for closed fracture: Principal | ICD-10-CM

## 2019-02-16 HISTORY — PX: TRANSTHORACIC ECHOCARDIOGRAM: SHX275

## 2019-02-16 LAB — CBC
HCT: 29.5 % — ABNORMAL LOW (ref 36.0–46.0)
Hemoglobin: 8.9 g/dL — ABNORMAL LOW (ref 12.0–15.0)
MCH: 29.6 pg (ref 26.0–34.0)
MCHC: 30.2 g/dL (ref 30.0–36.0)
MCV: 98 fL (ref 80.0–100.0)
Platelets: 163 10*3/uL (ref 150–400)
RBC: 3.01 MIL/uL — ABNORMAL LOW (ref 3.87–5.11)
RDW: 13.9 % (ref 11.5–15.5)
WBC: 9.7 10*3/uL (ref 4.0–10.5)
nRBC: 0 % (ref 0.0–0.2)

## 2019-02-16 LAB — ECHOCARDIOGRAM COMPLETE
Height: 56 in
Weight: 3026.47 oz

## 2019-02-16 LAB — BASIC METABOLIC PANEL
Anion gap: 10 (ref 5–15)
BUN: 62 mg/dL — ABNORMAL HIGH (ref 8–23)
CO2: 23 mmol/L (ref 22–32)
Calcium: 8.6 mg/dL — ABNORMAL LOW (ref 8.9–10.3)
Chloride: 107 mmol/L (ref 98–111)
Creatinine, Ser: 3.05 mg/dL — ABNORMAL HIGH (ref 0.44–1.00)
GFR calc Af Amer: 16 mL/min — ABNORMAL LOW (ref >60)
GFR calc non Af Amer: 14 mL/min — ABNORMAL LOW (ref >60)
Glucose, Bld: 361 mg/dL — ABNORMAL HIGH (ref 70–99)
Potassium: 4.7 mmol/L (ref 3.5–5.1)
Sodium: 140 mmol/L (ref 135–145)

## 2019-02-16 LAB — GLUCOSE, CAPILLARY
Glucose-Capillary: 353 mg/dL — ABNORMAL HIGH (ref 70–99)
Glucose-Capillary: 391 mg/dL — ABNORMAL HIGH (ref 70–99)
Glucose-Capillary: 283 mg/dL — ABNORMAL HIGH (ref 70–99)
Glucose-Capillary: 246 mg/dL — ABNORMAL HIGH (ref 70–99)
Glucose-Capillary: 225 mg/dL — ABNORMAL HIGH (ref 70–99)

## 2019-02-16 MED ORDER — INSULIN DETEMIR 100 UNIT/ML ~~LOC~~ SOLN
35.0000 [IU] | Freq: Two times a day (BID) | SUBCUTANEOUS | Status: DC
  Administered 2019-02-16 – 2019-02-18 (×6): 35 [IU] via SUBCUTANEOUS
  Filled 2019-02-16 (×8): qty 0.35

## 2019-02-16 MED ORDER — HEPARIN SODIUM (PORCINE) 5000 UNIT/ML IJ SOLN
5000.0000 [IU] | Freq: Three times a day (TID) | INTRAMUSCULAR | Status: DC
  Administered 2019-02-16 – 2019-02-17 (×3): 5000 [IU] via SUBCUTANEOUS
  Filled 2019-02-16 (×3): qty 1

## 2019-02-16 MED ORDER — INSULIN ASPART 100 UNIT/ML ~~LOC~~ SOLN
7.0000 [IU] | Freq: Once | SUBCUTANEOUS | Status: AC
  Administered 2019-02-16: 7 [IU] via SUBCUTANEOUS

## 2019-02-16 MED ORDER — INSULIN DETEMIR 100 UNIT/ML ~~LOC~~ SOLN
45.0000 [IU] | Freq: Every day | SUBCUTANEOUS | Status: DC
  Filled 2019-02-16: qty 0.45

## 2019-02-16 MED ORDER — POLYETHYLENE GLYCOL 3350 17 G PO PACK
17.0000 g | PACK | Freq: Every day | ORAL | Status: DC
  Administered 2019-02-16 – 2019-02-21 (×3): 17 g via ORAL
  Filled 2019-02-16 (×5): qty 1

## 2019-02-16 MED ORDER — SENNA 8.6 MG PO TABS
2.0000 | ORAL_TABLET | Freq: Every day | ORAL | Status: DC
  Administered 2019-02-16 – 2019-02-21 (×3): 17.2 mg via ORAL
  Filled 2019-02-16 (×5): qty 2

## 2019-02-16 MED ORDER — CHLORHEXIDINE GLUCONATE CLOTH 2 % EX PADS
6.0000 | MEDICATED_PAD | Freq: Every day | CUTANEOUS | Status: DC
  Administered 2019-02-16 – 2019-02-17 (×2): 6 via TOPICAL

## 2019-02-16 MED ORDER — PERFLUTREN LIPID MICROSPHERE
1.0000 mL | INTRAVENOUS | Status: DC | PRN
  Administered 2019-02-16: 09:00:00 4 mL via INTRAVENOUS
  Filled 2019-02-16: qty 10

## 2019-02-16 MED ORDER — BISACODYL 10 MG RE SUPP: 10.0000 mg | Freq: Every day | RECTAL | Status: DC | PRN

## 2019-02-16 MED ORDER — INFLUENZA VAC A&B SA ADJ QUAD 0.5 ML IM PRSY
0.5000 mL | PREFILLED_SYRINGE | INTRAMUSCULAR | Status: DC
  Filled 2019-02-16: qty 0.5

## 2019-02-16 MED ORDER — MUPIROCIN 2 % EX OINT
1.0000 "application " | TOPICAL_OINTMENT | Freq: Two times a day (BID) | CUTANEOUS | Status: AC
  Administered 2019-02-16 – 2019-02-20 (×10): 1 via NASAL
  Filled 2019-02-16 (×3): qty 22

## 2019-02-16 MED ORDER — INSULIN ASPART 100 UNIT/ML ~~LOC~~ SOLN
6.0000 [IU] | Freq: Three times a day (TID) | SUBCUTANEOUS | Status: DC
  Administered 2019-02-16 – 2019-02-18 (×8): 6 [IU] via SUBCUTANEOUS

## 2019-02-16 NOTE — Plan of Care (Signed)
  Problem: Skin Integrity: Goal: Risk for impaired skin integrity will decrease Outcome: Progressing   Problem: Self-Concept: Goal: Ability to maintain and perform role responsibilities to the fullest extent possible will improve Outcome: Progressing   Problem: Pain Management: Goal: Pain level will decrease Outcome: Progressing

## 2019-02-16 NOTE — Progress Notes (Signed)
Inpatient Diabetes Program Recommendations  AACE/ADA: New Consensus Statement on Inpatient Glycemic Control (2015)  Target Ranges:  Prepandial:   less than 140 mg/dL      Peak postprandial:   less than 180 mg/dL (1-2 hours)      Critically ill patients:  140 - 180 mg/dL   Lab Results  Component Value Date   GLUCAP 391 (H) 02/16/2019   HGBA1C 7.5 (H) 02/14/2019    Review of Glycemic Control  Diabetes history: DM 2 Outpatient Diabetes medications: NPH 55 units bid, Regular insulin 40 units tid, Trulicity 1.5 mg QMonday Current orders for Inpatient glycemic control:  Levemir 45 units Daily Novolog 0-15 units tid + hs Novolog 6 units tid meal coverage  A1c 7.5% on 11/7  Inpatient Diabetes Program Recommendations:    Noted Levemir increased to 45 units Daily.  Consider BID dosing of Levemir 35 units bid.  Thanks,  Tama Headings RN, MSN, BC-ADM Inpatient Diabetes Coordinator Team Pager [phone] (8a-5p)

## 2019-02-16 NOTE — Progress Notes (Signed)
Addendum   Nephrology input appreciated.  I discussed with Dr. Joelyn Oms.  Patient's creatinine is at baseline.  He advised to continue IV Lasix for additional 24 hours and okay to proceed with surgery.  Cardiology input appreciated, reasonable to proceed to surgery at intermediate risk with precautions outlined in their notes.  I have updated Dr. Lyla Glassing who may plan for surgery 11/10.  Vernell Leep, MD, FACP, [facility]. [facility]  To contact the attending provider between 7A-7P or the covering provider during after hours 7P-7A, please log into the web site [email or web address] and access using universal [place] password for that web site. If you do not have the password, please call the hospital operator.

## 2019-02-16 NOTE — Progress Notes (Signed)
Notified Dr Silas [place] that patient's lab Blood glucose level was 361 at 0223 this AM. Patient may have surgery today. Will continue to monitor.

## 2019-02-16 NOTE — Progress Notes (Signed)
PROGRESS NOTE   Danielle [name]  [id number]    DOB: [birth year]    DOA: 02/14/2019  PCP: Jani Gravel, MD   I have briefly reviewed patients previous medical records in [facility].  Chief Complaint  Patient presents with   Fall   Hip Pain    Brief Narrative:  84 year old [name], lives with daughter, ambulates with help of walker, mostly sedentary, requires assistance for most ADLs, hard of hearing and refuses to wear hearing aid, PMH of NSTEMI 2012 when she presented with acute pulmonary edema, severe three-vessel CAD, s/p PCI, chronic diastolic CHF, stage III chronic kidney disease (unknown baseline), type II DM/IDDM on high doses of insulins, CVA, s/p left carotid stent, HLD, HTN, obesity, sustained mechanical fall at home 2 days PTA and presented to [facility] ED on 11/7 due to left hip pain and unable to weight-bear.  Admitted for acute right intertrochanteric hip fracture, orthopedics consulted and plan surgery possibly 02/16/2019.  Course complicated by acute on chronic kidney disease and anasarca, nephrology consulted.  Cardiology consulted for preop clearance.  Patient is not medically ready for surgery yet.  Surgery scheduled for 11/9 canceled.   Assessment & Plan:   Active Problems:   Hip fracture (HCC)   Acute right trochanteric hip fracture  Sustained status post mechanical fall.  Patient was supposed to be for surgery 11/9.  However she is not medically stable for surgery and hence surgery was canceled.  Will have to reschedule pending medical optimization.  Communicated with Dr. Lyla Glassing.  Patient is high risk for perioperative CV events due to multiple medical problems including severe CAD, s/p PCI, chronic diastolic CHF, acute on chronic kidney disease, anasarca, poorly controlled DM 2, obesity.  However if she does not have surgery, her prognosis is even worse due to pain and immobility with associated complications.  Discussed all of this in detail with patient's  daughter at bedside who verbalized understanding but family has decided to proceed with surgery despite being aware of high risk.   Cardiology input appreciated.  Discussed with Dr. Johnsie Cancel 11/8.  High risk for surgery.  Await follow-up.  Patient reported right shoulder pain on 11/8 and x-ray of right shoulder without acute findings.  Acute on stage III chronic kidney disease/anasarca  Baseline creatinine is not known.  Hence cannot further subclassify her stage III CKD.  Last creatinine in CHL is 1.28 in 2012.  Daughter reports that they saw a nephrologist for a period of time but has not seen one for a long time.  Requested medical records from PCP.  Presented with creatinine of 3.35 which improved slightly to 3.05 post hydration and holding Lasix and ARB.  Nephrology input appreciated.  Renal ultrasound unremarkable.  DD: Acute kidney injury from decompensated CHF versus progression of CKD to stage IV/V.  Initiated IV Lasix 80 mg every 8 hours.  Difficulty placing Foley catheter.  Has pure wick.  Intake output may be inaccurate versus oliguric.  No significant change in hypervolemic status.  Etiology unclear.  Could be related to diabetic nephropathy and?  Nephrotic range proteinuria, CKD, CHF and hypoalbuminemia.  Follow daily BMP.  Also on sodium bicarbonate.  Acute on chronic diastolic CHF  Lacks respiratory symptoms but significant third spacing/volume overload.  TTE 02/16/2019: LVEF 60-65%, severe LVH moderate MS no aortic stenosis.  Diuretic management as indicated above.  ARB on hold due to acute kidney injury.  Cardiology input appreciated, follow-up pending.  Acute respiratory failure with hypoxia  Secondary  to decompensated CHF, possible OHS.  Oxygen saturations reportedly in the 70s when patient dislodged her nasal cannula oxygen this morning.  Saturating in the low 90s on 2 L/min.    CAD, s/p PCI (CFX and LAD) in 2012 in the setting of NSTEMI & acute diastolic  CHF  No anginal symptoms and patient quite sedentary at home.  Noncompliant with diet.  Is on Brilinta without aspirin.  Continue atorvastatin, Imdur and metoprolol.  Brilinta on hold since 2 days prior to admission, keep on hold for upcoming surgery.  Poorly controlled type II DM with renal complications, obesity and hyperlipidemia.  Patient on high doses of N and R insulin at home, noncompliant with diet, does not check CBGs regularly.  Follows with Dr. Chalmers Cater, Endocrinology  A1c 7.5.  Changed her medications to Levemir 35 units daily, NovoLog moderate sensitivity SSI and mealtime NovoLog 4 units 3 times daily.  Monitor CBGs closely and adjust insulins as needed.  DM coordinator input appreciated.  CBGs persistently high as noted below.  Increased Levemir to 35 units twice daily.  Essential hypertension  Reasonably controlled on Imdur twice daily and metoprolol 25 mg twice daily.  Added as needed IV hydralazine.  Hyperlipidemia  Continue atorvastatin.  PAD  History of left carotid stent remotely in [place].  Anemia in CKD  Hemoglobin is dropped slightly from 9.8-8.9.  Follow CBC daily.  Morbid obesity/Body mass index is 42.41 kg/m.  Hard of hearing  Chest x-ray abnormality  Chest x-ray 11/7: Abnormal appearance of left hilum and left lung base of uncertain significance possibly related to chronic scarring and/or airspace disease.  Recommended PA and lateral x-rays, perform when able.  Follow-up chest x-ray two-view 11/8: Suggestive of pulmonary edema.  Asymptomatic bacteriuria   DVT prophylaxis: Subcutaneous heparin Code Status: Full Family Communication: Discussed in detail with patient's daughter at bedside on 11/9, updated care and answered questions Disposition: To be determined pending clinical improvement and surgery   Consultants:  Orthopedics. Cardiology. Nephrology.  Procedures:  None  Antimicrobials:  None   Subjective: Patient has  been n.p.o. for potential surgery.  Thirsty.  Advised daughter that surgery has been canceled when hence patient can eat.  Right hip pain, worse with movement.  No dyspnea or chest pain.  Congested cough.  Last BM 11/6.  Objective:  Vitals:   02/16/19 0000 02/16/19 0351 02/16/19 0800 02/16/19 1000  BP:  (!) 112/51 127/62   Pulse:  82 87 83  Resp:  15 18   Temp:  98.3 F (36.8 C) 99.3 F (37.4 C)   TempSrc:  Oral Oral   SpO2:  93% 92%   Weight: 85.8 kg     Height: 4\' 8"  (1.422 m)       Examination:  General exam: Elderly female, moderately built and morbidly obese lying comfortably supine in bed without distress. Respiratory system: Slightly diminished breath sounds in the bases with occasional basal crackles but otherwise clear to auscultation. Cardiovascular system: S1 and S2 heard, RRR.  No JVD.  2/6 systolic murmur at apex.  Anasarca with pitting edema of all 4 extremities and lower anterior abdominal wall, no significant change compared to yesterday. Gastrointestinal system: Abdomen is nondistended/obese, soft and nontender. No organomegaly or masses felt. Normal bowel sounds heard. Central nervous system: Alert and seems oriented. No focal neurological deficits. Extremities: Patient declining to move any of her limbs despite her daughter asking her to do so. Skin: No rashes, lesions or ulcers Psychiatry: Judgement and insight unable to  assess due to language barrier. Mood & affect flat.     Data Reviewed: I have personally reviewed following labs and imaging studies  CBC: Recent Labs  Lab 02/14/19 1436 02/16/19 0223  WBC 11.2* 9.7  HGB 9.8* 8.9*  HCT 31.3* 29.5*  MCV 96.0 98.0  PLT 154 [id number]   Basic Metabolic Panel: Recent Labs  Lab 02/14/19 1436 02/15/19 0514 02/16/19 0223  NA 140 143 140  K 4.7 4.9 4.7  CL 106 110 107  CO2 21* 23 23  GLUCOSE 412* 289* 361*  BUN 65* 62* 62*  CREATININE 3.35* 3.12* 3.05*  CALCIUM 8.8* 8.6* 8.6*   Liver Function  Tests: Recent Labs  Lab 02/15/19 0514  AST 17  ALT 11  ALKPHOS 169*  BILITOT 0.5  PROT 7.6  ALBUMIN 3.0*    Cardiac Enzymes: No results for input(s): CKTOTAL, CKMB, CKMBINDEX, TROPONINI in the last 168 hours.  CBG: Recent Labs  Lab 02/15/19 1202 02/15/19 1621 02/15/19 2121 02/16/19 0648 02/16/19 0739  GLUCAP 283* 223* 253* 353* 391*    Recent Results (from the past 240 hour(s))  SARS CORONAVIRUS 2 (TAT 6-24 HRS) Nasopharyngeal Nasopharyngeal Swab     Status: None   Collection Time: 02/14/19  6:16 PM   Specimen: Nasopharyngeal Swab  Result Value Ref Range Status   SARS Coronavirus 2 NEGATIVE NEGATIVE Final    Comment: (NOTE) SARS-CoV-2 target nucleic acids are NOT DETECTED. The SARS-CoV-2 RNA is generally detectable in upper and lower respiratory specimens during the acute phase of infection. Negative results do not preclude SARS-CoV-2 infection, do not rule out co-infections with other pathogens, and should not be used as the sole basis for treatment or other patient management decisions. Negative results must be combined with clinical observations, patient history, and epidemiological information. The expected result is Negative. Fact Sheet for Patients: SugarRoll.be Fact Sheet for Healthcare Providers: https://www.woods-mathews.com/ This test is not yet approved or cleared by the [place] FDA and  has been authorized for detection and/or diagnosis of SARS-CoV-2 by FDA under an Emergency Use Authorization (EUA). This EUA will remain  in effect (meaning this test can be used) for the duration of the COVID-19 declaration under Section 56 4(b)(1) of the Act, 21 U.S.C. section 360bbb-3(b)(1), unless the authorization is terminated or revoked sooner. Performed at [facility], [place] [address] [place], [place] [address]   Surgical pcr screen     Status: Abnormal   Collection Time: 02/14/19  9:01 PM   Specimen:  Nasal Mucosa; Nasal Swab  Result Value Ref Range Status   MRSA, PCR NEGATIVE NEGATIVE Final   Staphylococcus aureus POSITIVE (A) NEGATIVE Final    Comment: (NOTE) The Xpert SA Assay (FDA approved for NASAL specimens in patients 44 years of age and older), is one component of a comprehensive surveillance program. It is not intended to diagnose infection nor to guide or monitor treatment. Performed at [facility], [place] [address], [place], [place] [address]          Radiology Studies: Dg Chest 1 View  Result Date: 02/14/2019 CLINICAL DATA:  Hip fracture, preoperative EXAM: CHEST  1 VIEW COMPARISON:  12/27/2011 FINDINGS: Cardiomegaly. Abnormal appearance of the left hilum and left lung base, of uncertain significance, possibly related to chronic scarring and/or airspace disease. The visualized skeletal structures are unremarkable. IMPRESSION: 1. Cardiomegaly. 2. Abnormal appearance of the left hilum and left lung base, of uncertain significance, possibly related to chronic scarring and/or airspace disease. Recommend PA  and lateral radiographs to further evaluate. Electronically Signed   By: Eddie Candle M.D.   On: 02/14/2019 16:16   Dg Chest 2 View  Result Date: 02/15/2019 CLINICAL DATA:  Abnormal chest radiograph, AP and lateral recommended EXAM: CHEST - 2 VIEW COMPARISON:  Radiograph 02/14/2019 FINDINGS: Diffuse hazy perihilar opacities with fissural and septal thickening and indistinct, cephalized pulmonary vascularity. More focal patchy opacities are present towards the lung bases. The heart is enlarged. The aorta is calcified and tortuous. No visible pneumothorax or effusion. Degenerative changes are present in the imaged spine and shoulders. IMPRESSION: 1. Diffuse hazy perihilar opacities with fissural thickening and indistinct pulmonary vascularity, consistent with pulmonary edema. 2. More focal patchy opacities towards the lung bases could reflect developing alveolar edema or  consolidative process. Electronically Signed   By: Lovena Le M.D.   On: 02/15/2019 15:15   US Renal  Result Date: 02/15/2019 CLINICAL DATA:  Acute kidney injury EXAM: RENAL / URINARY TRACT ULTRASOUND COMPLETE COMPARISON:  Renal ultrasound 03/05/2005 FINDINGS: Right Kidney: Renal measurements: 10.5 x 5.0 x 4.7 cm = volume: 128.9 mL . Echogenicity within normal limits. No mass or hydronephrosis visualized. Left Kidney: Renal measurements: 10.3 x 5.0 x 6.7 cm = volume: 179.4 mL. Echogenicity within normal limits. No mass or hydronephrosis visualized. Bladder: Appears normal for degree of bladder distention. Other: None. IMPRESSION: Unremarkable renal ultrasound. Electronically Signed   By: Lovena Le M.D.   On: 02/15/2019 22:27   Dg Shoulder Right Port  Result Date: 02/15/2019 CLINICAL DATA:  Patient with right shoulder pain.  Limited mobility. EXAM: PORTABLE RIGHT SHOULDER COMPARISON:  None. FINDINGS: Right shoulder joint degenerative changes. No evidence for acute fracture or dislocation. AC joint degenerative changes. Visualized right hemithorax is unremarkable. IMPRESSION: No acute osseous abnormality.  Degenerative changes. Electronically Signed   By: Lovey Newcomer M.D.   On: 02/15/2019 12:32   Dg Knee Right Port  Result Date: 02/14/2019 CLINICAL DATA:  Hip fracture EXAM: PORTABLE RIGHT KNEE - 1-2 VIEW COMPARISON:  None. FINDINGS: No fracture or dislocation of the right knee. There is moderate tricompartmental joint space narrowing and osteophytosis. No knee joint effusion. Vascular calcinosis. IMPRESSION: No fracture or dislocation of the right knee. There is moderate tricompartmental joint space narrowing and osteophytosis. No knee joint effusion. Electronically Signed   By: Eddie Candle M.D.   On: 02/14/2019 18:49   Dg Hip Unilat  With Pelvis 2-3 Views Right  Result Date: 02/14/2019 CLINICAL DATA:  Right hip pain after fall. EXAM: DG HIP (WITH OR WITHOUT PELVIS) 2-3V RIGHT COMPARISON:  None.  FINDINGS: Acute impacted right intertrochanteric femur fracture with varus angulation. No dislocation. The hip joint spaces are relatively preserved. The pubic symphysis and sacroiliac joints are intact. Osteopenia. Vascular calcifications. IMPRESSION: 1. Acute impacted right intertrochanteric femur fracture. Electronically Signed   By: Titus Dubin M.D.   On: 02/14/2019 15:09        Scheduled Meds:  atorvastatin  40 mg Oral QHS   calcitRIOL  0.25 mcg Oral QODAY   Chlorhexidine Gluconate Cloth  6 each Topical Daily   furosemide  80 mg Intravenous Q8H   [START ON 02/17/2019] influenza vaccine adjuvanted  0.5 mL Intramuscular Tomorrow-1000   insulin aspart  0-15 Units Subcutaneous TID WC   insulin aspart  0-5 Units Subcutaneous QHS   insulin aspart  6 Units Subcutaneous TID WC   insulin detemir  45 Units Subcutaneous Daily   isosorbide mononitrate  30 mg Oral Q breakfast  isosorbide mononitrate  60 mg Oral QPC lunch   metoprolol tartrate  25 mg Oral BID   mupirocin ointment  1 application Nasal BID   pantoprazole  40 mg Oral Daily   povidone-iodine  2 application Topical Once   sodium bicarbonate  325 mg Oral Q48H   Continuous Infusions:   ceFAZolin (ANCEF) IV     tranexamic acid       LOS: 2 days     Vernell Leep, MD, FACP, [facility]. [facility]  To contact the attending provider between 7A-7P or the covering provider during after hours 7P-7A, please log into the web site [email or web address] and access using universal [place] password for that web site. If you do not have the password, please call the hospital operator.  02/16/2019, 10:41 AM

## 2019-02-16 NOTE — Progress Notes (Signed)
Progress Note  Patient Name: Danielle [name] Date of Encounter: 02/16/2019  Primary Cardiologist: Glenetta Hew, MD   Subjective   No pain or SOB, lying flat, her daughter interpreted.   Inpatient Medications    Scheduled Meds:  atorvastatin  40 mg Oral QHS   calcitRIOL  0.25 mcg Oral QODAY   Chlorhexidine Gluconate Cloth  6 each Topical Daily   furosemide  80 mg Intravenous Q8H   heparin injection (subcutaneous)  5,000 Units Subcutaneous Q8H   [START ON 02/17/2019] influenza vaccine adjuvanted  0.5 mL Intramuscular Tomorrow-1000   insulin aspart  0-15 Units Subcutaneous TID WC   insulin aspart  0-5 Units Subcutaneous QHS   insulin aspart  6 Units Subcutaneous TID WC   insulin detemir  35 Units Subcutaneous BID   isosorbide mononitrate  30 mg Oral Q breakfast   isosorbide mononitrate  60 mg Oral QPC lunch   metoprolol tartrate  25 mg Oral BID   mupirocin ointment  1 application Nasal BID   pantoprazole  40 mg Oral Daily   polyethylene glycol  17 g Oral Daily   povidone-iodine  2 application Topical Once   senna  2 tablet Oral Daily   sodium bicarbonate  325 mg Oral Q48H   Continuous Infusions:   ceFAZolin (ANCEF) IV     tranexamic acid     PRN Meds: acetaminophen, bisacodyl, HYDROmorphone (DILAUDID) injection, ondansetron **OR** ondansetron (ZOFRAN) IV, oxyCODONE   Vital Signs    Vitals:   02/16/19 0000 02/16/19 0351 02/16/19 0800 02/16/19 1000  BP:  (!) 112/51 127/62   Pulse:  82 87 83  Resp:  15 18   Temp:  98.3 F (36.8 C) 99.3 F (37.4 C)   TempSrc:  Oral Oral   SpO2:  93% 92%   Weight: 85.8 kg     Height: 4\' 8"  (1.422 m)       Intake/Output Summary (Last 24 hours) at 02/16/2019 1158 Last data filed at 02/16/2019 0350 Gross per 24 hour  Intake 170 ml  Output 600 ml  Net -430 ml   Last 3 Weights 02/16/2019 01/22/2017 09/14/2016  Weight (lbs) 189 lb 2.5 oz 189 lb 3.2 oz 185 lb  Weight (kg) 85.8 kg 85.821 kg 83.915 kg      Telemetry     SR with PVCs occ - Personally Reviewed  ECG    No new - Personally Reviewed  Physical Exam   GEN: No acute distress.   Neck: No JVD Cardiac: RRR, no murmurs, rubs, or gallops.  Respiratory: Clear to auscultation bilaterally. Ant. position GI: Soft, nontender, non-distended  MS: No edema except Rt hip area . Neuro:  Nonfocal  Psych: Normal affect   Labs    High Sensitivity Troponin:  No results for input(s): TROPONINIHS in the last 720 hours.    Chemistry Recent Labs  Lab 02/14/19 1436 02/15/19 0514 02/16/19 0223  NA 140 143 140  K 4.7 4.9 4.7  CL 106 110 107  CO2 21* 23 23  GLUCOSE 412* 289* 361*  BUN 65* 62* 62*  CREATININE 3.35* 3.12* 3.05*  CALCIUM 8.8* 8.6* 8.6*  PROT  --  7.6  --   ALBUMIN  --  3.0*  --   AST  --  17  --   ALT  --  11  --   ALKPHOS  --  169*  --   BILITOT  --  0.5  --   GFRNONAA 12* 13* 14*  GFRAA 14*  15* 16*  ANIONGAP 13 10 10      Hematology Recent Labs  Lab 02/14/19 1436 02/16/19 0223  WBC 11.2* 9.7  RBC 3.26* 3.01*  HGB 9.8* 8.9*  HCT 31.3* 29.5*  MCV 96.0 98.0  MCH 30.1 29.6  MCHC 31.3 30.2  RDW 14.1 13.9  PLT 154 163    BNPNo results for input(s): BNP, PROBNP in the last 168 hours.   DDimer No results for input(s): DDIMER in the last 168 hours.   Radiology    Dg Chest 1 View  Result Date: 02/14/2019 CLINICAL DATA:  Hip fracture, preoperative EXAM: CHEST  1 VIEW COMPARISON:  12/27/2011 FINDINGS: Cardiomegaly. Abnormal appearance of the left hilum and left lung base, of uncertain significance, possibly related to chronic scarring and/or airspace disease. The visualized skeletal structures are unremarkable. IMPRESSION: 1. Cardiomegaly. 2. Abnormal appearance of the left hilum and left lung base, of uncertain significance, possibly related to chronic scarring and/or airspace disease. Recommend PA and lateral radiographs to further evaluate. Electronically Signed   By: Eddie Candle M.D.   On: 02/14/2019 16:16   Dg  Chest 2 View  Result Date: 02/15/2019 CLINICAL DATA:  Abnormal chest radiograph, AP and lateral recommended EXAM: CHEST - 2 VIEW COMPARISON:  Radiograph 02/14/2019 FINDINGS: Diffuse hazy perihilar opacities with fissural and septal thickening and indistinct, cephalized pulmonary vascularity. More focal patchy opacities are present towards the lung bases. The heart is enlarged. The aorta is calcified and tortuous. No visible pneumothorax or effusion. Degenerative changes are present in the imaged spine and shoulders. IMPRESSION: 1. Diffuse hazy perihilar opacities with fissural thickening and indistinct pulmonary vascularity, consistent with pulmonary edema. 2. More focal patchy opacities towards the lung bases could reflect developing alveolar edema or consolidative process. Electronically Signed   By: Lovena Le M.D.   On: 02/15/2019 15:15   US Renal  Result Date: 02/15/2019 CLINICAL DATA:  Acute kidney injury EXAM: RENAL / URINARY TRACT ULTRASOUND COMPLETE COMPARISON:  Renal ultrasound 03/05/2005 FINDINGS: Right Kidney: Renal measurements: 10.5 x 5.0 x 4.7 cm = volume: 128.9 mL . Echogenicity within normal limits. No mass or hydronephrosis visualized. Left Kidney: Renal measurements: 10.3 x 5.0 x 6.7 cm = volume: 179.4 mL. Echogenicity within normal limits. No mass or hydronephrosis visualized. Bladder: Appears normal for degree of bladder distention. Other: None. IMPRESSION: Unremarkable renal ultrasound. Electronically Signed   By: Lovena Le M.D.   On: 02/15/2019 22:27   Dg Shoulder Right Port  Result Date: 02/15/2019 CLINICAL DATA:  Patient with right shoulder pain.  Limited mobility. EXAM: PORTABLE RIGHT SHOULDER COMPARISON:  None. FINDINGS: Right shoulder joint degenerative changes. No evidence for acute fracture or dislocation. AC joint degenerative changes. Visualized right hemithorax is unremarkable. IMPRESSION: No acute osseous abnormality.  Degenerative changes. Electronically Signed   By:  Lovey Newcomer M.D.   On: 02/15/2019 12:32   Dg Knee Right Port  Result Date: 02/14/2019 CLINICAL DATA:  Hip fracture EXAM: PORTABLE RIGHT KNEE - 1-2 VIEW COMPARISON:  None. FINDINGS: No fracture or dislocation of the right knee. There is moderate tricompartmental joint space narrowing and osteophytosis. No knee joint effusion. Vascular calcinosis. IMPRESSION: No fracture or dislocation of the right knee. There is moderate tricompartmental joint space narrowing and osteophytosis. No knee joint effusion. Electronically Signed   By: Eddie Candle M.D.   On: 02/14/2019 18:49   Dg Hip Unilat  With Pelvis 2-3 Views Right  Result Date: 02/14/2019 CLINICAL DATA:  Right hip pain after fall. EXAM: DG  HIP (WITH OR WITHOUT PELVIS) 2-3V RIGHT COMPARISON:  None. FINDINGS: Acute impacted right intertrochanteric femur fracture with varus angulation. No dislocation. The hip joint spaces are relatively preserved. The pubic symphysis and sacroiliac joints are intact. Osteopenia. Vascular calcifications. IMPRESSION: 1. Acute impacted right intertrochanteric femur fracture. Electronically Signed   By: Titus Dubin M.D.   On: 02/14/2019 15:09    Cardiac Studies   Echo 02/16/19 Sonographer Comments: Technically difficult study due to poor echo windows, suboptimal parasternal window, suboptimal apical window, suboptimal subcostal window and patient is morbidly obese. IMPRESSIONS    1. Left ventricular ejection fraction, by visual estimation, is 60 to 65%. The left ventricle has normal function. There is severely increased left ventricular hypertrophy.  2. Definity contrast agent was given IV to delineate the left ventricular endocardial borders.  3. Left ventricular diastolic parameters are indeterminate.  4. Global right ventricle has normal systolic function.The right ventricular size is normal. No increase in right ventricular wall thickness.  5. Left atrial size was normal.  6. Right atrial size was normal.  7.  Presence of pericardial fat pad.  8. Trivial pericardial effusion is present.  9. Severe mitral annular calcification. 10. The mitral valve is abnormal. No evidence of mitral valve regurgitation. Moderate mitral stenosis secondary to annular calcification. MG 7 at HR 89 bpm, MVA 1.4 cm2 by continuity equation 11. The tricuspid valve is normal in structure. Tricuspid valve regurgitation is not demonstrated. 12. There is Moderate calcification of the aortic valve. 13. The aortic valve is abnormal. Aortic valve regurgitation is trivial. Mild to moderate aortic valve sclerosis/calcification without any evidence of aortic stenosis. Vmax 1.8, MG 7 14. The pulmonic valve was not well visualized. Pulmonic valve regurgitation is not visualized. 15. There is mild dilatation of the ascending aorta measuring 37 mm. 16. TR signal is inadequate for assessing pulmonary artery systolic pressure.  FINDINGS  Left Ventricle: Left ventricular ejection fraction, by visual estimation, is 60 to 65%. The left ventricle has normal function. Definity contrast agent was given IV to delineate the left ventricular endocardial borders. There is severely increased left  ventricular hypertrophy. Concentric left ventricular hypertrophy. Left ventricular diastolic parameters are indeterminate.  Right Ventricle: The right ventricular size is normal. No increase in right ventricular wall thickness. Global RV systolic function is has normal systolic function.  Left Atrium: Left atrial size was normal in size.  Right Atrium: Right atrial size was normal in size  Pericardium: Trivial pericardial effusion is present. Presence of pericardial fat pad.  Mitral Valve: The mitral valve is abnormal. Severe mitral annular calcification. Moderate mitral valve stenosis by observation. MV peak gradient, 15.5 mmHg. No evidence of mitral valve regurgitation.  Tricuspid Valve: The tricuspid valve is normal in structure. Tricuspid valve  regurgitation is not demonstrated.  Aortic Valve: The aortic valve is abnormal. Aortic valve regurgitation is trivial. Mild to moderate aortic valve sclerosis/calcification is present, without any evidence of aortic stenosis. Moderate calcification. Aortic valve mean gradient measures 6.5  mmHg. Aortic valve peak gradient measures 12.5 mmHg. Aortic valve area, by VTI measures 1.55 cm.  Pulmonic Valve: The pulmonic valve was not well visualized. Pulmonic valve regurgitation is not visualized.  Aorta: Aortic dilatation noted. There is mild dilatation of the ascending aorta measuring 37 mm.  IAS/Shunts: The interatrial septum was not well visualized.     LEFT VENTRICLE PLAX 2D LVIDd:         2.91 cm  Diastology LVIDs:  1.77 cm  LV e' lateral:   5.55 cm/s LV PW:         1.71 cm  LV E/e' lateral: 22.6 LV IVS:        1.63 cm  LV e' medial:    4.03 cm/s LVOT diam:     1.70 cm  LV E/e' medial:  31.1 LV SV:         23 ml LV SV Index:   12.21 LVOT Area:     2.27 cm    RIGHT VENTRICLE RV Basal diam:  2.94 cm RV S prime:     12.60 cm/s TAPSE (M-mode): 1.3 cm  LEFT ATRIUM             Index       RIGHT ATRIUM           Index LA diam:        3.20 cm 1.85 cm/m  RA Area:     13.00 cm LA Vol (A2C):   62.8 ml 36.23 ml/m RA Volume:   31.00 ml  17.88 ml/m LA Vol (A4C):   44.2 ml 25.50 ml/m LA Biplane Vol: 54.5 ml 31.44 ml/m  AORTIC VALVE AV Area (Vmax):    1.36 cm AV Area (Vmean):   1.46 cm AV Area (VTI):     1.55 cm AV Vmax:           177.00 cm/s AV Vmean:          121.500 cm/s AV VTI:            0.375 m AV Peak Grad:      12.5 mmHg AV Mean Grad:      6.5 mmHg LVOT Vmax:         106.00 cm/s LVOT Vmean:        78.200 cm/s LVOT VTI:          0.256 m LVOT/AV VTI ratio: 0.68   AORTA Ao Root diam: 2.90 cm Ao Asc diam:  3.70 cm  MITRAL VALVE MV Area (PHT): 2.43 cm              SHUNTS MV Peak grad:  15.5 mmHg             Systemic VTI:  0.26 m MV Mean grad:   7.0 mmHg              Systemic Diam: 1.70 cm MV Vmax:       1.97 m/s MV Vmean:      125.0 cm/s MV VTI:        0.43 m MV PHT:        90.48 msec MV Decel Time: 312 msec MV E velocity: 125.50 cm/s 103 cm/s MV A velocity: 183.00 cm/s 70.3 cm/s MV E/A ratio:  0.69        1.5   Patient Profile     84 y.o. female with a hx of CAD, chronic diastolic CHF, CKD stage III-IV, DM, stroke s/p L carotid stent in [place], HTN, HLD, obesity, chronic anemia (Hgb 10.5 in 2019) hard of hearing now admitted with rt hip fx and need for surgery.    Assessment & Plan    Mechanical fall with Rt hip fx, plan for surgery though cancelled for today until clearance established.    Known CAD cath 2012 with staged PCI to LAD, LCx-OM with 2 stents. She had a diffusely diseased RCA that was not re-vascularizeable via PCI or CABG  She had a nuclear stress  test in 2015 that was intermediate risk showing inferior wall ischemia consistent with known RCA disease. Invasive evaluation has been deferred in light of stable coronary status. --echo today  with normal EF though severely increased LVH Moderate; mitral stenosis secondary to annular calcification. MG 7 at HR 89 bpm, MVA 1.4 cm2 by continuity equation;  There is mild dilatation of the ascending aorta measuring 37 mm.  (last seen by Dr. Ellyn Hack in 2018)  Chronic diastolic CHF with pulmonary edema on CXR  On lasix 80 mg every 8 hours,  Improved today, able to lie flat without SOB.  On imdur and lopressor same home dose,  cozar held with AKI   AKI new -nephrology has seen.  Cr 3.35>>3.12>>3.05 today   Anemia chronic today hgb 8.9 down from 9.8  DM-2 insulin dependent per IM         For questions or updates, please contact [place] Please consult [email or web address] for contact info under        Signed, Cecilie Kicks, NP  02/16/2019, 11:58 AM

## 2019-02-16 NOTE — Plan of Care (Signed)
  Problem: Safety: Goal: Ability to remain free from injury will improve Outcome: Progressing   Problem: Pain Management: Goal: Pain level will decrease Outcome: Progressing   

## 2019-02-16 NOTE — Plan of Care (Signed)

## 2019-02-16 NOTE — Progress Notes (Signed)
Admit: 02/14/2019 LOS: 32  84 year old female with advanced CKD, CAD, hyperlipidemia, admitted with hip fracture on the right side  Subjective:  . Labs from PCP, August of this year, creatinine 2.98 . Creatinine stable at 3.05, K4.7 . At least 0.6 L UOP yesterday . 2 L nasal cannula, lying flat in bed . Trace edema . On Lasix 80 IV 3 times daily . UA with low-level proteinuria, no hematuria . Renal ultrasound with normal-sized kidneys bilaterally, no acute structural issues  11/08 0701 - 11/09 0700 In: 270 [P.O.:270] Out: 600 [Urine:600]  Filed Weights   02/16/19 0000  Weight: 85.8 kg    Scheduled Meds: . atorvastatin  40 mg Oral QHS  . calcitRIOL  0.25 mcg Oral QODAY  . Chlorhexidine Gluconate Cloth  6 each Topical Daily  . furosemide  80 mg Intravenous Q8H  . heparin injection (subcutaneous)  5,000 Units Subcutaneous Q8H  . [START ON 02/17/2019] influenza vaccine adjuvanted  0.5 mL Intramuscular Tomorrow-1000  . insulin aspart  0-15 Units Subcutaneous TID WC  . insulin aspart  0-5 Units Subcutaneous QHS  . insulin aspart  6 Units Subcutaneous TID WC  . insulin detemir  35 Units Subcutaneous BID  . isosorbide mononitrate  30 mg Oral Q breakfast  . isosorbide mononitrate  60 mg Oral QPC lunch  . metoprolol tartrate  25 mg Oral BID  . mupirocin ointment  1 application Nasal BID  . pantoprazole  40 mg Oral Daily  . polyethylene glycol  17 g Oral Daily  . povidone-iodine  2 application Topical Once  . senna  2 tablet Oral Daily  . sodium bicarbonate  325 mg Oral Q48H   Continuous Infusions: .  ceFAZolin (ANCEF) IV    . tranexamic acid     PRN Meds:.acetaminophen, bisacodyl, HYDROmorphone (DILAUDID) injection, ondansetron **OR** ondansetron (ZOFRAN) IV, oxyCODONE  Current Labs: reviewed    Physical Exam:  Blood pressure 127/62, pulse 83, temperature 99.3 F (37.4 C), temperature source Oral, resp. rate 18, height 4\' 8"  (1.422 m), weight 85.8 kg, SpO2 92 %. NAD, lying  flat in bed, obese Regular, normal S1-S2 Clear breath sounds bilaterally Trace peripheral edema No rashes or lesions Awake alert, nonfocal  A 1. Renal failure, appears this is all CKD, advanced stage IV.  Creatinine was 2.98 in August of this year.  Adequate urine output on IV Lasix.  Likely etiology is multifactorial 2. Status post right hip fracture following fall, needs surgical repair 3. History of CAD 4. chronic diastolic heart failure  5. DM2 6. History of stroke with carotid stenting in [place]  . Would continue to diurese with 80 IV 3 times daily for another 24 hours then transition to oral diuretics . I think she has renal optimization for hip repair . Will need outpt f/u . Daily weights, Daily Renal Panel, Strict I/Os, Avoid nephrotoxins (NSAIDs, judicious IV Contrast)   Pearson Grippe MD 02/16/2019, 2:39 PM  Recent Labs  Lab 02/14/19 1436 02/15/19 0514 02/16/19 0223  NA 140 143 140  K 4.7 4.9 4.7  CL 106 110 107  CO2 21* 23 23  GLUCOSE 412* 289* 361*  BUN 65* 62* 62*  CREATININE 3.35* 3.12* 3.05*  CALCIUM 8.8* 8.6* 8.6*   Recent Labs  Lab 02/14/19 1436 02/16/19 0223  WBC 11.2* 9.7  HGB 9.8* 8.9*  HCT 31.3* 29.5*  MCV 96.0 98.0  PLT 154 163

## 2019-02-16 NOTE — Progress Notes (Signed)
Attempt x1 to insert foley catheter was unsuccessful. SWOT nurse was notified and will attempt later during the day. Pure wick was reapplied.

## 2019-02-16 NOTE — Progress Notes (Signed)
  Echocardiogram 2D Echocardiogram has been performed with Definity.  Danielle [name] 02/16/2019, 9:05 AM

## 2019-02-16 NOTE — Progress Notes (Signed)
Attempted to insert Foley x 2 and unsuccessful with 36F Foley. Patient tense and could not relax.  Amy, RN attempted to insert Foley with a 3F x 2 and unsuccessful. Purewick reapplied for urine collection. Will continue to monitor.

## 2019-02-17 ENCOUNTER — Encounter (HOSPITAL_COMMUNITY): Payer: Self-pay | Admitting: Surgery

## 2019-02-17 ENCOUNTER — Inpatient Hospital Stay (HOSPITAL_COMMUNITY): Payer: Medicare Other | Admitting: Anesthesiology

## 2019-02-17 ENCOUNTER — Inpatient Hospital Stay (HOSPITAL_COMMUNITY): Payer: Medicare Other

## 2019-02-17 ENCOUNTER — Encounter (HOSPITAL_COMMUNITY)
Admission: EM | Disposition: A | Discharge status: Home Health | Payer: Self-pay | Source: Home / Self Care | Attending: Internal Medicine

## 2019-02-17 HISTORY — PX: INTRAMEDULLARY (IM) NAIL INTERTROCHANTERIC: SHX5875

## 2019-02-17 LAB — RENAL FUNCTION PANEL
Albumin: 2.9 g/dL — ABNORMAL LOW (ref 3.5–5.0)
Anion gap: 10 (ref 5–15)
BUN: 74 mg/dL — ABNORMAL HIGH (ref 8–23)
CO2: 27 mmol/L (ref 22–32)
Calcium: 9 mg/dL (ref 8.9–10.3)
Chloride: 104 mmol/L (ref 98–111)
Creatinine, Ser: 3.21 mg/dL — ABNORMAL HIGH (ref 0.44–1.00)
GFR calc Af Amer: 15 mL/min — ABNORMAL LOW (ref >60)
GFR calc non Af Amer: 13 mL/min — ABNORMAL LOW (ref >60)
Glucose, Bld: 254 mg/dL — ABNORMAL HIGH (ref 70–99)
Phosphorus: 5.1 mg/dL — ABNORMAL HIGH (ref 2.5–4.6)
Potassium: 4.7 mmol/L (ref 3.5–5.1)
Sodium: 141 mmol/L (ref 135–145)

## 2019-02-17 LAB — CBC
HCT: 30.9 % — ABNORMAL LOW (ref 36.0–46.0)
Hemoglobin: 9.3 g/dL — ABNORMAL LOW (ref 12.0–15.0)
MCH: 29 pg (ref 26.0–34.0)
MCHC: 30.1 g/dL (ref 30.0–36.0)
MCV: 96.3 fL (ref 80.0–100.0)
Platelets: 185 10*3/uL (ref 150–400)
RBC: 3.21 MIL/uL — ABNORMAL LOW (ref 3.87–5.11)
RDW: 13.8 % (ref 11.5–15.5)
WBC: 10.9 10*3/uL — ABNORMAL HIGH (ref 4.0–10.5)
nRBC: 0 % (ref 0.0–0.2)

## 2019-02-17 LAB — GLUCOSE, CAPILLARY
Glucose-Capillary: 241 mg/dL — ABNORMAL HIGH (ref 70–99)
Glucose-Capillary: 252 mg/dL — ABNORMAL HIGH (ref 70–99)
Glucose-Capillary: 176 mg/dL — ABNORMAL HIGH (ref 70–99)
Glucose-Capillary: 191 mg/dL — ABNORMAL HIGH (ref 70–99)
Glucose-Capillary: 253 mg/dL — ABNORMAL HIGH (ref 70–99)

## 2019-02-17 SURGERY — FIXATION, FRACTURE, INTERTROCHANTERIC, WITH INTRAMEDULLARY ROD
Anesthesia: General | Laterality: Right

## 2019-02-17 MED ORDER — SUGAMMADEX SODIUM 200 MG/2ML IV SOLN
INTRAVENOUS | Status: DC | PRN
  Administered 2019-02-17: 171.6 mg via INTRAVENOUS

## 2019-02-17 MED ORDER — METOCLOPRAMIDE HCL 5 MG PO TABS: 5.0000 mg | ORAL_TABLET | Freq: Three times a day (TID) | ORAL | Status: DC | PRN

## 2019-02-17 MED ORDER — ONDANSETRON HCL 4 MG/2ML IJ SOLN: 4.0000 mg | Freq: Once | INTRAMUSCULAR | Status: DC | PRN

## 2019-02-17 MED ORDER — ROCURONIUM BROMIDE 50 MG/5ML IV SOSY
PREFILLED_SYRINGE | INTRAVENOUS | Status: DC | PRN
  Administered 2019-02-17: 40 mg via INTRAVENOUS

## 2019-02-17 MED ORDER — PHENYLEPHRINE HCL-NACL 10-0.9 MG/250ML-% IV SOLN
INTRAVENOUS | Status: DC | PRN
  Administered 2019-02-17: 50 ug/min via INTRAVENOUS

## 2019-02-17 MED ORDER — METOCLOPRAMIDE HCL 5 MG/ML IJ SOLN: 5.0000 mg | Freq: Three times a day (TID) | INTRAMUSCULAR | Status: DC | PRN

## 2019-02-17 MED ORDER — HEPARIN SODIUM (PORCINE) 5000 UNIT/ML IJ SOLN
5000.0000 [IU] | Freq: Two times a day (BID) | INTRAMUSCULAR | Status: DC
  Administered 2019-02-18 – 2019-02-21 (×7): 5000 [IU] via SUBCUTANEOUS
  Filled 2019-02-17 (×8): qty 1

## 2019-02-17 MED ORDER — LACTATED RINGERS IV SOLN
INTRAVENOUS | Status: DC | PRN
  Administered 2019-02-17 (×2): via INTRAVENOUS

## 2019-02-17 MED ORDER — CHLORHEXIDINE GLUCONATE CLOTH 2 % EX PADS
6.0000 | MEDICATED_PAD | Freq: Every day | CUTANEOUS | Status: DC
  Administered 2019-02-17 – 2019-02-21 (×5): 6 via TOPICAL

## 2019-02-17 MED ORDER — PHENYLEPHRINE 40 MCG/ML (10ML) SYRINGE FOR IV PUSH (FOR BLOOD PRESSURE SUPPORT)
PREFILLED_SYRINGE | INTRAVENOUS | Status: AC
  Filled 2019-02-17: qty 10

## 2019-02-17 MED ORDER — SODIUM CHLORIDE 0.9 % IV SOLN
INTRAVENOUS | Status: DC | PRN
  Administered 2019-02-17: 13:00:00 via INTRAVENOUS

## 2019-02-17 MED ORDER — PHENOL 1.4 % MT LIQD: 1.0000 | OROMUCOSAL | Status: DC | PRN

## 2019-02-17 MED ORDER — 0.9 % SODIUM CHLORIDE (POUR BTL) OPTIME
TOPICAL | Status: DC | PRN
  Administered 2019-02-17: 1000 mL

## 2019-02-17 MED ORDER — FENTANYL CITRATE (PF) 100 MCG/2ML IJ SOLN: 25.0000 ug | INTRAMUSCULAR | Status: DC | PRN

## 2019-02-17 MED ORDER — ACETAMINOPHEN 10 MG/ML IV SOLN
INTRAVENOUS | Status: AC
  Filled 2019-02-17: qty 100

## 2019-02-17 MED ORDER — ACETAMINOPHEN 10 MG/ML IV SOLN
1000.0000 mg | Freq: Once | INTRAVENOUS | Status: DC | PRN
  Administered 2019-02-17: 15:00:00 1000 mg via INTRAVENOUS

## 2019-02-17 MED ORDER — PROPOFOL 10 MG/ML IV BOLUS
INTRAVENOUS | Status: AC
  Filled 2019-02-17: qty 20

## 2019-02-17 MED ORDER — ONDANSETRON HCL 4 MG/2ML IJ SOLN: 4.0000 mg | Freq: Four times a day (QID) | INTRAMUSCULAR | Status: DC | PRN

## 2019-02-17 MED ORDER — ONDANSETRON HCL 4 MG PO TABS: 4.0000 mg | ORAL_TABLET | Freq: Four times a day (QID) | ORAL | Status: DC | PRN

## 2019-02-17 MED ORDER — FENTANYL CITRATE (PF) 100 MCG/2ML IJ SOLN
INTRAMUSCULAR | Status: DC | PRN
  Administered 2019-02-17: 100 ug via INTRAVENOUS

## 2019-02-17 MED ORDER — FENTANYL CITRATE (PF) 250 MCG/5ML IJ SOLN
INTRAMUSCULAR | Status: AC
  Filled 2019-02-17: qty 5

## 2019-02-17 MED ORDER — DOCUSATE SODIUM 100 MG PO CAPS
100.0000 mg | ORAL_CAPSULE | Freq: Two times a day (BID) | ORAL | Status: DC
  Administered 2019-02-17 – 2019-02-21 (×5): 100 mg via ORAL
  Filled 2019-02-17 (×8): qty 1

## 2019-02-17 MED ORDER — PROPOFOL 10 MG/ML IV BOLUS
INTRAVENOUS | Status: DC | PRN
  Administered 2019-02-17: 100 mg via INTRAVENOUS

## 2019-02-17 MED ORDER — CEFAZOLIN SODIUM-DEXTROSE 2-4 GM/100ML-% IV SOLN
2.0000 g | INTRAVENOUS | Status: AC
  Administered 2019-02-17 – 2019-02-18 (×2): 2 g via INTRAVENOUS
  Filled 2019-02-17 (×2): qty 100

## 2019-02-17 MED ORDER — ALBUMIN HUMAN 5 % IV SOLN
INTRAVENOUS | Status: DC | PRN
  Administered 2019-02-17: 13:00:00 via INTRAVENOUS

## 2019-02-17 MED ORDER — LIDOCAINE 2% (20 MG/ML) 5 ML SYRINGE
INTRAMUSCULAR | Status: DC | PRN
  Administered 2019-02-17: 60 mg via INTRAVENOUS

## 2019-02-17 MED ORDER — CEFAZOLIN SODIUM-DEXTROSE 2-3 GM-%(50ML) IV SOLR
INTRAVENOUS | Status: DC | PRN
  Administered 2019-02-17: 2 g via INTRAVENOUS

## 2019-02-17 MED ORDER — MENTHOL 3 MG MT LOZG: 1.0000 | LOZENGE | OROMUCOSAL | Status: DC | PRN

## 2019-02-17 MED ORDER — FENTANYL CITRATE (PF) 100 MCG/2ML IJ SOLN
INTRAMUSCULAR | Status: AC
  Filled 2019-02-17: qty 2

## 2019-02-17 MED ORDER — FUROSEMIDE 10 MG/ML IJ SOLN
80.0000 mg | Freq: Two times a day (BID) | INTRAMUSCULAR | Status: DC
  Administered 2019-02-17 – 2019-02-18 (×2): 80 mg via INTRAVENOUS
  Filled 2019-02-17 (×2): qty 8

## 2019-02-17 MED ORDER — SUCCINYLCHOLINE CHLORIDE 20 MG/ML IJ SOLN
INTRAMUSCULAR | Status: DC | PRN
  Administered 2019-02-17: 100 mg via INTRAVENOUS

## 2019-02-17 SURGICAL SUPPLY — 46 items
ALCOHOL 70% 16 OZ (MISCELLANEOUS) ×1 IMPLANT
BNDG COHESIVE 4X5 TAN STRL (GAUZE/BANDAGES/DRESSINGS) IMPLANT
CHLORAPREP W/TINT 26 (MISCELLANEOUS) ×3 IMPLANT
COVER PERINEAL POST (MISCELLANEOUS) ×3 IMPLANT
COVER SURGICAL LIGHT HANDLE (MISCELLANEOUS) ×3 IMPLANT
DERMABOND ADVANCED (GAUZE/BANDAGES/DRESSINGS) ×2
DERMABOND ADVANCED .7 DNX12 (GAUZE/BANDAGES/DRESSINGS) ×2 IMPLANT
DRAPE C-ARM 42X72 X-RAY (DRAPES) ×3 IMPLANT
DRAPE C-ARMOR (DRAPES) ×3 IMPLANT
DRAPE HALF SHEET 40X57 (DRAPES) ×3 IMPLANT
DRAPE IMP U-DRAPE 54X76 (DRAPES) ×6 IMPLANT
DRAPE STERI IOBAN 125X83 (DRAPES) ×3 IMPLANT
DRAPE U-SHAPE 47X51 STRL (DRAPES) ×6 IMPLANT
DRSG MEPILEX BORDER 4X4 (GAUZE/BANDAGES/DRESSINGS) ×4 IMPLANT
ELECT REM PT RETURN 9FT ADLT (ELECTROSURGICAL) ×3
ELECTRODE REM PT RTRN 9FT ADLT (ELECTROSURGICAL) ×1 IMPLANT
FACESHIELD WRAPAROUND (MASK) ×3 IMPLANT
FACESHIELD WRAPAROUND OR TEAM (MASK) ×1 IMPLANT
GLOVE BIO SURGEON STRL SZ8.5 (GLOVE) ×6 IMPLANT
GLOVE BIOGEL PI IND STRL 8.5 (GLOVE) ×1 IMPLANT
GLOVE BIOGEL PI INDICATOR 8.5 (GLOVE) ×2
GOWN STRL REUS W/ TWL LRG LVL3 (GOWN DISPOSABLE) ×2 IMPLANT
GOWN STRL REUS W/TWL 2XL LVL3 (GOWN DISPOSABLE) ×3 IMPLANT
GOWN STRL REUS W/TWL LRG LVL3 (GOWN DISPOSABLE) ×4
GUIDEROD T2 3X1000 (ROD) ×2 IMPLANT
K-WIRE  3.2X450M STR (WIRE) ×2
K-WIRE 3.2X450M STR (WIRE) ×1
KIT TURNOVER KIT B (KITS) ×3 IMPLANT
KWIRE 3.2X450M STR (WIRE) IMPLANT
MARKER SKIN DUAL TIP RULER LAB (MISCELLANEOUS) ×1 IMPLANT
NAIL 125D 10X300MM RIGHT (Nail) ×2 IMPLANT
NS IRRIG 1000ML POUR BTL (IV SOLUTION) ×3 IMPLANT
PACK GENERAL/GYN (CUSTOM PROCEDURE TRAY) ×3 IMPLANT
PACK UNIVERSAL I (CUSTOM PROCEDURE TRAY) ×3 IMPLANT
PAD ARMBOARD 7.5X6 YLW CONV (MISCELLANEOUS) ×6 IMPLANT
REAMER SHAFT BIXCUT (INSTRUMENTS) ×2 IMPLANT
SCREW LAG GAMMA 3 95MM (Screw) ×2 IMPLANT
SCREW LOCKING T2 F/T  5X37.5MM (Screw) ×2 IMPLANT
SCREW LOCKING T2 F/T 5X37.5MM (Screw) IMPLANT
SUT MNCRL AB 3-0 PS2 27 (SUTURE) ×5 IMPLANT
SUT MON AB 2-0 CT1 27 (SUTURE) ×3 IMPLANT
SUT VIC AB 1 CT1 27 (SUTURE) ×2
SUT VIC AB 1 CT1 27XBRD ANBCTR (SUTURE) ×1 IMPLANT
TOWEL GREEN STERILE (TOWEL DISPOSABLE) ×3 IMPLANT
TOWEL GREEN STERILE FF (TOWEL DISPOSABLE) ×3 IMPLANT
TRAY FOLEY W/BAG SLVR 14FR (SET/KITS/TRAYS/PACK) ×2 IMPLANT

## 2019-02-17 NOTE — Op Note (Signed)
OPERATIVE REPORT  SURGEON: Rod Can, MD   ASSISTANT: Staff.  PREOPERATIVE DIAGNOSIS: Right intertrochanteric femur fracture.   POSTOPERATIVE DIAGNOSIS: Right intertrochanteric femur fracture.   PROCEDURE: Intramedullary fixation, Right femur.   IMPLANTS: Stryker Gamma3 Hip Fracture Nail, 10 by 300 mm, 125 degrees. 10.5 x 95 mm Hip Fracture Nail Lag Screw. 5 x 37.5 mm distal interlocking screw 1.  ANESTHESIA:  General  ESTIMATED BLOOD LOSS:-100 mL    ANTIBIOTICS:  2 g Ancef.  DRAINS: None.  COMPLICATIONS: None.   CONDITION: PACU - hemodynamically stable.   BRIEF CLINICAL NOTE: Danielle [name] is a 84 y.o. female who presented with an intertrochanteric femur fracture. The patient was admitted to the hospitalist service and underwent perioperative risk stratification and medical optimization. The risks, benefits, and alternatives to the procedure were explained, and the patient elected to proceed.  PROCEDURE IN DETAIL: Surgical site was marked by myself. The patient was taken to the operating room and anesthesia was induced on the bed. The patient was then transferred to the [facility] table and the nonoperative lower extremity was scissored underneath the operative side. The fracture was reduced with traction, internal rotation, and adduction. The hip was prepped and draped in the normal sterile surgical fashion. Timeout was called verifying side and site of surgery. Preop antibiotics were given with 60 minutes of beginning the procedure.  Fluoroscopy was used to define the patient's anatomy. A 4 cm incision was made just proximal to the tip of the greater trochanter. The awl was used to obtain the standard starting point for a trochanteric entry nail under fluoroscopic control. The guidepin was placed. The entry reamer was used to open the proximal femur.  I placed the guidewire to the level of the physeal scar of the knee. I measured the length of the guidewire. Sequential reaming was  performed up to a size 12 mm with excellent chatter. Therefore, a size 10 by 300 mm nail was selected and assembled to the jig on the back table. The nail was placed without any difficulty. A stab incision was created on the aspect of the femur. A bone hook was placed subperiosteally to maintain the reduction. Through a separate stab incision, the cannula was placed down to the bone in preparation for the cephalomedullary device. A guidepin was placed into the femoral head using AP and lateral fluoroscopy views. The pin was measured, and then reaming was performed to the appropriate depth. The lag screw was inserted to the appropriate depth. The fracture was compressed through the jig. The setscrew was tightened and then loosened one quarter turn. Using perfect circle technique, a distal interlocking screw was placed. The jig was removed. Final AP and lateral fluoroscopy views were obtained to confirm fracture reduction and hardware placement. Tip apex distance was appropriate. There was no chondral penetration.  The wounds were copiously irrigated with saline. The wound was closed in layers with #1 Vicryl for the fascia, 2-0 Monocryl for the deep dermal layer, and 3-0 Monocryl subcuticular stitch. Glue was applied to the skin. Once the glue was fully hardened, sterile dressing was applied. The patient was then awakened from anesthesia and taken to the PACU in stable condition. Sponge needle and instrument counts were correct at the end of the case 2. There were no known complications.  We will readmit the patient to the hospitalist. Weightbearing status will be weightbearing as tolerated with a walker. We will resume Heparin for DVT prophylaxis. The patient will work with physical therapy and undergo  disposition planning.

## 2019-02-17 NOTE — Anesthesia Procedure Notes (Signed)
Procedure Name: Intubation Date/Time: 02/17/2019 12:28 PM Performed by: Lavell Luster, CRNA Pre-anesthesia Checklist: Patient identified, Emergency Drugs available, Suction available, Patient being monitored and Timeout performed Patient Re-evaluated:Patient Re-evaluated prior to induction Oxygen Delivery Method: Circle system utilized Preoxygenation: Pre-oxygenation with 100% oxygen Induction Type: IV induction and Rapid sequence Laryngoscope Size: Mac, 3 and Glidescope Grade View: Grade I Tube type: Oral Tube size: 7.5 mm Number of attempts: 1 Airway Equipment and Method: Video-laryngoscopy Placement Confirmation: ETT inserted through vocal cords under direct vision,  positive ETCO2 and breath sounds checked- equal and bilateral Secured at: 21 cm Tube secured with: Tape Dental Injury: Teeth and Oropharynx as per pre-operative assessment  Difficulty Due To: Difficulty was anticipated, Difficult Airway- due to limited oral opening, Difficult Airway- due to anterior larynx and Difficult Airway- due to reduced neck mobility

## 2019-02-17 NOTE — Progress Notes (Signed)
Inpatient Diabetes Program Recommendations  AACE/ADA: New Consensus Statement on Inpatient Glycemic Control (2015)  Target Ranges:  Prepandial:   less than 140 mg/dL      Peak postprandial:   less than 180 mg/dL (1-2 hours)      Critically ill patients:  140 - 180 mg/dL   Lab Results  Component Value Date   GLUCAP 241 (H) 02/17/2019   HGBA1C 7.5 (H) 02/14/2019    Review of Glycemic Control Results for Danielle, [name] (MRN [id number]) as of 02/17/2019 09:36  Ref. Range 02/16/2019 07:39 02/16/2019 11:42 02/16/2019 16:21 02/16/2019 22:22 02/17/2019 06:49  Glucose-Capillary Latest Ref Range: 70 - 99 mg/dL 391 (H) 283 (H) 246 (H) 225 (H) 241 (H)   Diabetes history: DM 2 Outpatient Diabetes medications: NPH 55 units bid, Regular insulin 40 units tid, Trulicity 1.5 mg QMonday Current orders for Inpatient glycemic control:  Levemir 35 units bid Novolog 0-15 units tid + hs Novolog 6 units tid meal coverage  A1c 7.5% on 11/7  Inpatient Diabetes Program Recommendations:    Fasting glucose better this am 241 mg/dl. NPO this am.  Consider increasing Levemir starting tonight to 40 units bid.  Thanks,  Tama Headings RN, MSN, BC-ADM Inpatient Diabetes Coordinator Team Pager [phone] (8a-5p)

## 2019-02-17 NOTE — Progress Notes (Signed)
Admit: 02/14/2019 LOS: 29  84 year old female with advanced CKD, CAD, hyperlipidemia, admitted with hip fracture on the right side  Subjective:  . No acute events overnight . At least 1.6 L of urine yesterday . For hip surgery today . Creatinine stable 3.2, K4.7 . 2 L nasal cannula, breathing comfortably flat in bed  11/09 0701 - 11/10 0700 In: 600 [P.O.:600] Out: 1575 [Urine:1575]  Filed Weights   02/16/19 0000  Weight: 85.8 kg    Scheduled Meds: . atorvastatin  40 mg Oral QHS  . calcitRIOL  0.25 mcg Oral QODAY  . Chlorhexidine Gluconate Cloth  6 each Topical Daily  . furosemide  80 mg Intravenous Q8H  . influenza vaccine adjuvanted  0.5 mL Intramuscular Tomorrow-1000  . insulin aspart  0-15 Units Subcutaneous TID WC  . insulin aspart  0-5 Units Subcutaneous QHS  . insulin aspart  6 Units Subcutaneous TID WC  . insulin detemir  35 Units Subcutaneous BID  . isosorbide mononitrate  30 mg Oral Q breakfast  . isosorbide mononitrate  60 mg Oral QPC lunch  . metoprolol tartrate  25 mg Oral BID  . mupirocin ointment  1 application Nasal BID  . pantoprazole  40 mg Oral Daily  . polyethylene glycol  17 g Oral Daily  . povidone-iodine  2 application Topical Once  . senna  2 tablet Oral Daily  . sodium bicarbonate  325 mg Oral Q48H   Continuous Infusions:  PRN Meds:.acetaminophen, bisacodyl, HYDROmorphone (DILAUDID) injection, ondansetron **OR** ondansetron (ZOFRAN) IV, oxyCODONE  Current Labs: reviewed    Physical Exam:  Blood pressure (!) 143/62, pulse 93, temperature 99.1 F (37.3 C), temperature source Oral, resp. rate 14, height 4\' 8"  (1.422 m), weight 85.8 kg, SpO2 95 %. NAD, lying flat in bed, obese Regular, normal S1-S2 Clear breath sounds bilaterally Trace peripheral edema No rashes or lesions Awake alert, nonfocal  A 1. Renal failure, appears this is all CKD, advanced stage IV.  Creatinine was 2.98 in August of this year.  Adequate urine output on IV Lasix.   Likely etiology is multifactorial, including DM2 2. Status post right hip fracture following fall, needs surgical repair planned 11/10 3. History of CAD 4. chronic diastolic heart failure  5. DM2 6. History of stroke with carotid stenting in [place] 7. Anemia, mild, normocytic  P  . Reduce frequency of Lasix to twice daily, transition to oral regimen likely tomorrow . Okay with OR today  . Will need outpt f/u at [place] . Daily weights, Daily Renal Panel, Strict I/Os, Avoid nephrotoxins (NSAIDs, judicious IV Contrast)   Pearson Grippe MD 02/17/2019, 10:12 AM  Recent Labs  Lab 02/15/19 0514 02/16/19 0223 02/17/19 0557  NA 143 140 141  K 4.9 4.7 4.7  CL 110 107 104  CO2 23 23 27   GLUCOSE 289* 361* 254*  BUN 62* 62* 74*  CREATININE 3.12* 3.05* 3.21*  CALCIUM 8.6* 8.6* 9.0  PHOS  --   --  5.1*   Recent Labs  Lab 02/14/19 1436 02/16/19 0223 02/17/19 0557  WBC 11.2* 9.7 10.9*  HGB 9.8* 8.9* 9.3*  HCT 31.3* 29.5* 30.9*  MCV 96.0 98.0 96.3  PLT 154 163 185

## 2019-02-17 NOTE — Anesthesia Postprocedure Evaluation (Signed)
Anesthesia Post Note  Patient: Danielle [name]  Procedure(s) Performed: INTRAMEDULLARY (IM) NAIL RIGHT INTERTROCHANTERIC (Right )     Patient location during evaluation: PACU Anesthesia Type: General Level of consciousness: awake Pain management: pain level controlled Vital Signs Assessment: post-procedure vital signs reviewed and stable Respiratory status: spontaneous breathing, nonlabored ventilation, respiratory function stable and patient connected to nasal cannula oxygen Cardiovascular status: blood pressure returned to baseline and stable Postop Assessment: no apparent nausea or vomiting Anesthetic complications: no    Last Vitals:  Vitals:   02/17/19 1612 02/17/19 1957  BP: (!) 110/55 (!) 98/48  Pulse: 88 97  Resp:    Temp:  36.9 C  SpO2:  95%    Last Pain:  Vitals:   02/17/19 1957  TempSrc: Oral  PainSc:                  Ryan P Ellender

## 2019-02-17 NOTE — Care Management (Signed)
CM consult acknowledged to assist with any HH/DME needs. Awaiting PT/OT eval for DCP recommendations and will continue to follow.  Ariyon Mittleman RN, BSN, NCM-BC, ACM-RN [phone] 

## 2019-02-17 NOTE — Interval H&P Note (Signed)
History and Physical Interval Note:  02/17/2019 11:47 AM  Danielle [name]  has presented today for surgery, with the diagnosis of right intertrochanteric fracture.  The various methods of treatment have been discussed with the patient and family. After consideration of risks, benefits and other options for treatment, the patient has consented to  Procedure(s): INTRAMEDULLARY (IM) NAIL RIGHT INTERTROCHANTRIC (Right) as a surgical intervention.  The patient's history has been reviewed, patient examined, no change in status, stable for surgery.  I have reviewed the patient's chart and labs.  Questions were answered to the patient's and family's satisfaction.    The risks, benefits, and alternatives were discussed with the patient / family. There are risks associated with the surgery including, but not limited to, problems with anesthesia (death), infection, differences in leg length/angulation/rotation, fracture of bones, loosening or failure of implants, malunion, nonunion, hematoma (blood accumulation) which may require surgical drainage, blood clots, pulmonary embolism, nerve injury (foot drop), and blood vessel injury. The patient / family understand these risks and elects to proceed.    Danielle [name]

## 2019-02-17 NOTE — Progress Notes (Addendum)
PROGRESS NOTE   Danielle [name]  [id number]    DOB: 11-28-1934    DOA: 02/14/2019  PCP: Jani Gravel, MD   I have briefly reviewed patients previous medical records in [facility].  Chief Complaint  Patient presents with   Fall   Hip Pain    Brief Narrative:  84 year old [name], lives with daughter, ambulates with help of walker, mostly sedentary, requires assistance for most ADLs, hard of hearing and refuses to wear hearing aid, PMH of NSTEMI 2012 when she presented with acute pulmonary edema, severe three-vessel CAD, s/p PCI, chronic diastolic CHF, stage III chronic kidney disease (unknown baseline), type II DM/IDDM on high doses of insulins, CVA, s/p left carotid stent, HLD, HTN, obesity, sustained mechanical fall at home 2 days PTA and presented to [facility] ED on 11/7 due to left hip pain and unable to weight-bear.  Admitted for acute right intertrochanteric hip fracture, orthopedics consulted and plan surgery possibly 02/16/2019.  Course complicated by acute on chronic kidney disease and anasarca, nephrology consulted.  Cardiology consulted for preop clearance.  S/p right hip fracture surgery 11/10.   Assessment & Plan:   Active Problems:   Hip fracture (HCC)   Acute right trochanteric hip fracture  Sustained status post mechanical fall.  Patient was supposed to be for surgery 11/9.  However she was not medically stable for surgery and hence surgery was canceled.    After cardiology and nephrology clearance, determined to be intermediate risk, underwent right femur intramedullary fixation on 11/10.  Management per orthopedics.  Patient reported right shoulder pain on 11/8 and x-ray of right shoulder without acute findings.  Acute on stage 4 chronic kidney disease/anasarca  As per nephrology, creatinine 2.98 in August 2020 and hence suspect advanced stage IV CKD.  Hholding Lasix and ARB.  Nephrology follow-up appreciated.  Renal ultrasound unremarkable.    Etiology  unclear.  Could be related to diabetic nephropathy and?  Nephrotic range proteinuria, CKD, CHF and hypoalbuminemia.  Follow daily BMP.  Also on sodium bicarbonate.  Treating with IV Lasix, reduce to twice daily today and possible transition to oral regimen tomorrow.  Outpatient follow-up with nephrology.  Acute on chronic diastolic CHF  Lacks respiratory symptoms but significant third spacing/volume overload on admission.  TTE 02/16/2019: LVEF 60-65%, severe LVH moderate MS no aortic stenosis.  Diuretic management as indicated above.  ARB on hold due to acute kidney injury.  Cardiology seen this admission.  On IV diuretics per nephrology.  Improving.  Acute respiratory failure with hypoxia  Secondary to decompensated CHF, possible OHS.  Oxygen saturations reportedly in the 70s when patient dislodged her nasal cannula oxygen this morning.  Saturating in the low 90s on 2 L/min yesterday.  Will need to reassess oxygen requirement prior to discharge.  CAD, s/p PCI (CFX and LAD) in 2012 in the setting of NSTEMI & acute diastolic CHF  No anginal symptoms and patient quite sedentary at home.  Noncompliant with diet.  Is on Brilinta without aspirin.  Continue atorvastatin, Imdur and metoprolol.  Brilinta on hold since 2 days prior to admission, was on hold for surgery.  Resume Brilinta when okay with orthopedics.  Poorly controlled type II DM with renal complications, obesity and hyperlipidemia.  Patient on high doses of N and R insulin at home, noncompliant with diet, does not check CBGs regularly.  Follows with Dr. Chalmers Cater, Endocrinology  A1c 7.5.  Adjusted insulins this admission.  Currently on Levemir 35 units twice daily, NovoLog moderate  sensitivity SSI and mealtime NovoLog 6 units 3 times daily.  Better controlled than before, still mildly uncontrolled.  DM coordinator input appreciated.    Continue to monitor on current regimen overnight and consider increasing  Levemir to 40 units twice if persistently elevated.  Essential hypertension  Reasonably controlled on Imdur twice daily and metoprolol 25 mg twice daily.  Added as needed IV hydralazine.  Hyperlipidemia  Continue atorvastatin.  PAD  History of left carotid stent remotely in [place].  Anemia in CKD  Hemoglobin stable/9.3.  Morbid obesity/Body mass index is 42.41 kg/m.  Hard of hearing  Chest x-ray abnormality  Chest x-ray 11/7: Abnormal appearance of left hilum and left lung base of uncertain significance possibly related to chronic scarring and/or airspace disease.  Recommended PA and lateral x-rays, perform when able.  Follow-up chest x-ray two-view 11/8: Suggestive of pulmonary edema.  Asymptomatic bacteriuria   DVT prophylaxis: Subcutaneous heparin Code Status: Full Family Communication: Discussed in detail with patient's daughter at bedside on 11/10 prior to surgery, updated care and answered questions Disposition: To be determined pending clinical improvement.   Consultants:  Orthopedics. Cardiology. Nephrology.  Procedures:  Intramedullary fixation, right femur on 11/10.  Antimicrobials:  None   Subjective: Patient seen this morning prior to surgery.  Kept asking for water to drink, n.p.o. for upcoming procedure at that time.  Good urine output as per RN and daughter.  No chest pain or dyspnea.  Ongoing right lower extremity pain.  Objective:  Vitals:   02/17/19 1500 02/17/19 1508 02/17/19 1541 02/17/19 1612  BP: (!) 128/97  (!) 94/47 (!) 110/55  Pulse: 87 87 85 88  Resp: 20 16    Temp:  97.8 F (36.6 C) 98 F (36.7 C)   TempSrc:   Oral   SpO2: 99% 94% 93%   Weight:      Height:        Examination:  General exam: Elderly female, moderately built and morbidly obese lying comfortably supine in bed without distress. Respiratory system: Clear to auscultation.  No increased work of breathing. Cardiovascular system: S1 and S2 heard, RRR.  No  JVD.  2/6 systolic murmur at apex.  Improved edema of extremities, almost resolved now. Gastrointestinal system: Abdomen is nondistended/obese, soft and nontender. No organomegaly or masses felt. Normal bowel sounds heard. Central nervous system: Alert and seems oriented. No focal neurological deficits. Extremities: Patient declining to move any of her limbs despite her daughter asking her to do so. Skin: No rashes, lesions or ulcers Psychiatry: Judgement and insight unable to assess due to language barrier. Mood & affect flat.     Data Reviewed: I have personally reviewed following labs and imaging studies  CBC: Recent Labs  Lab 02/14/19 1436 02/16/19 0223 02/17/19 0557  WBC 11.2* 9.7 10.9*  HGB 9.8* 8.9* 9.3*  HCT 31.3* 29.5* 30.9*  MCV 96.0 98.0 96.3  PLT 154 163 [id number]   Basic Metabolic Panel: Recent Labs  Lab 02/14/19 1436 02/15/19 0514 02/16/19 0223 02/17/19 0557  NA 140 143 140 141  K 4.7 4.9 4.7 4.7  CL 106 110 107 104  CO2 21* 23 23 27   GLUCOSE 412* 289* 361* 254*  BUN 65* 62* 62* 74*  CREATININE 3.35* 3.12* 3.05* 3.21*  CALCIUM 8.8* 8.6* 8.6* 9.0  PHOS  --   --   --  5.1*   Liver Function Tests: Recent Labs  Lab 02/15/19 0514 02/17/19 0557  AST 17  --   ALT 11  --  ALKPHOS 169*  --   BILITOT 0.5  --   PROT 7.6  --   ALBUMIN 3.0* 2.9*    Cardiac Enzymes: No results for input(s): CKTOTAL, CKMB, CKMBINDEX, TROPONINI in the last 168 hours.  CBG: Recent Labs  Lab 02/16/19 2222 02/17/19 0649 02/17/19 1111 02/17/19 1433 02/17/19 1732  GLUCAP 225* 241* 252* 176* 191*    Recent Results (from the past 240 hour(s))  SARS CORONAVIRUS 2 (TAT 6-24 HRS) Nasopharyngeal Nasopharyngeal Swab     Status: None   Collection Time: 02/14/19  6:16 PM   Specimen: Nasopharyngeal Swab  Result Value Ref Range Status   SARS Coronavirus 2 NEGATIVE NEGATIVE Final    Comment: (NOTE) SARS-CoV-2 target nucleic acids are NOT DETECTED. The SARS-CoV-2 RNA is generally  detectable in upper and lower respiratory specimens during the acute phase of infection. Negative results do not preclude SARS-CoV-2 infection, do not rule out co-infections with other pathogens, and should not be used as the sole basis for treatment or other patient management decisions. Negative results must be combined with clinical observations, patient history, and epidemiological information. The expected result is Negative. Fact Sheet for Patients: SugarRoll.be Fact Sheet for Healthcare Providers: https://www.woods-mathews.com/ This test is not yet approved or cleared by the [place] FDA and  has been authorized for detection and/or diagnosis of SARS-CoV-2 by FDA under an Emergency Use Authorization (EUA). This EUA will remain  in effect (meaning this test can be used) for the duration of the COVID-19 declaration under Section 56 4(b)(1) of the Act, 21 U.S.C. section 360bbb-3(b)(1), unless the authorization is terminated or revoked sooner. Performed at [facility], [place] [address], [place], [place] [address]   Surgical pcr screen     Status: Abnormal   Collection Time: 02/14/19  9:01 PM   Specimen: Nasal Mucosa; Nasal Swab  Result Value Ref Range Status   MRSA, PCR NEGATIVE NEGATIVE Final   Staphylococcus aureus POSITIVE (A) NEGATIVE Final    Comment: (NOTE) The Xpert SA Assay (FDA approved for NASAL specimens in patients 101 years of age and older), is one component of a comprehensive surveillance program. It is not intended to diagnose infection nor to guide or monitor treatment. Performed at [facility], [place] [address] [place], [place] [address]          Radiology Studies: US Renal  Result Date: 02/15/2019 CLINICAL DATA:  Acute kidney injury EXAM: RENAL / URINARY TRACT ULTRASOUND COMPLETE COMPARISON:  Renal ultrasound 03/05/2005 FINDINGS: Right Kidney: Renal measurements: 10.5 x 5.0 x 4.7 cm = volume:  128.9 mL . Echogenicity within normal limits. No mass or hydronephrosis visualized. Left Kidney: Renal measurements: 10.3 x 5.0 x 6.7 cm = volume: 179.4 mL. Echogenicity within normal limits. No mass or hydronephrosis visualized. Bladder: Appears normal for degree of bladder distention. Other: None. IMPRESSION: Unremarkable renal ultrasound. Electronically Signed   By: Lovena Le M.D.   On: 02/15/2019 22:27   Pelvis Portable  Result Date: 02/17/2019 CLINICAL DATA:  ORIF right femur. EXAM: PORTABLE PELVIS 1-2 VIEWS COMPARISON:  Prior right hip study same day. FINDINGS: ORIF right hip. Hardware intact. Anatomic alignment one-view. Degenerative changes left hip. Aortoiliac and peripheral vascular calcification. IMPRESSION: ORIF right hip with anatomic alignment on one view. Electronically Signed   By: Marcello Moores  Register   On: 02/17/2019 15:09   Dg C-arm 1-60 Min  Result Date: 02/17/2019 CLINICAL DATA:  ORIF. EXAM: RIGHT FEMUR 2 VIEWS; DG C-ARM 1-60 MIN COMPARISON:  02/14/2019. FINDINGS:  ORIF right femur.  Hardware intact.  Anatomic alignment. IMPRESSION: ORIF right femur.  Anatomic alignment. Electronically Signed   By: Marcello Moores  Register   On: 02/17/2019 14:02   Dg Femur, Min 2 Views Right  Result Date: 02/17/2019 CLINICAL DATA:  ORIF. EXAM: RIGHT FEMUR 2 VIEWS; DG C-ARM 1-60 MIN COMPARISON:  02/14/2019. FINDINGS: ORIF right femur.  Hardware intact.  Anatomic alignment. IMPRESSION: ORIF right femur.  Anatomic alignment. Electronically Signed   By: Marcello Moores  Register   On: 02/17/2019 14:02   Dg Femur Port, Min 2 Views Right  Result Date: 02/17/2019 CLINICAL DATA:  84 year old female with right intertrochanteric fracture status post ORIF. EXAM: RIGHT FEMUR PORTABLE 2 VIEW COMPARISON:  Right hip radiograph dated 02/14/2019 FINDINGS: Status post ORIF of the right femoral fracture with an intramedullary rod and transcervical screw. The major fracture fragment appear in anatomic alignment. The orthopedic  hardware appears intact. There is a mildly displaced fracture fragment of the lesser trochanter. There is no dislocation. There is moderate arthritic changes of the right hip. Moderate to severe arthritic changes of the noted. Probable small suprapatellar effusion. Vascular calcifications. The soft tissues are grossly unremarkable. IMPRESSION: Status post ORIF of the right femoral fracture. No needed complication. Electronically Signed   By: Anner Crete M.D.   On: 02/17/2019 15:15        Scheduled Meds:  atorvastatin  40 mg Oral QHS   calcitRIOL  0.25 mcg Oral QODAY   Chlorhexidine Gluconate Cloth  6 each Topical Daily   docusate sodium  100 mg Oral BID   furosemide  80 mg Intravenous BID   [START ON 02/18/2019] heparin injection (subcutaneous)  5,000 Units Subcutaneous Q12H   influenza vaccine adjuvanted  0.5 mL Intramuscular Tomorrow-1000   insulin aspart  0-15 Units Subcutaneous TID WC   insulin aspart  0-5 Units Subcutaneous QHS   insulin aspart  6 Units Subcutaneous TID WC   insulin detemir  35 Units Subcutaneous BID   isosorbide mononitrate  30 mg Oral Q breakfast   isosorbide mononitrate  60 mg Oral QPC lunch   metoprolol tartrate  25 mg Oral BID   mupirocin ointment  1 application Nasal BID   pantoprazole  40 mg Oral Daily   polyethylene glycol  17 g Oral Daily   senna  2 tablet Oral Daily   sodium bicarbonate  325 mg Oral Q48H   Continuous Infusions:  acetaminophen      ceFAZolin (ANCEF) IV 2 g (02/17/19 1606)     LOS: 3 days     Vernell Leep, MD, FACP, [facility]  To contact the attending provider between 7A-7P or the covering provider during after hours 7P-7A, please log into the web site [email or web address] and access using universal [place] password for that web site. If you do not have the password, please call the hospital operator.  02/17/2019, 6:26 PM

## 2019-02-17 NOTE — Progress Notes (Signed)
PHARMACY NOTE:  ANTIMICROBIAL RENAL DOSAGE ADJUSTMENT  Current antimicrobial regimen includes a mismatch between antimicrobial dosage and estimated renal function.  As per policy approved by the Pharmacy & Therapeutics and Medical Executive Committees, the antimicrobial dosage will be adjusted accordingly.  Current antimicrobial dosage:  Cefazolin 2gm q6h x 2  Indication: surgical prophylaxis  Renal Function:  Estimated Creatinine Clearance: 12 mL/min (A) (by C-G formula based on SCr of 3.21 mg/dL (H)). []      On intermittent HD, scheduled: []      On CRRT    Antimicrobial dosage has been changed to:  Cefazolin 2gm q24h x 2  Additional comments:   Airen Dales A. Levada Dy, PharmD, BCPS, FNKF Clinical Pharmacist [place] Please utilize Amion for appropriate phone number to reach the unit pharmacist ([place])   02/17/2019 3:26 PM

## 2019-02-17 NOTE — Anesthesia Preprocedure Evaluation (Addendum)
Anesthesia Evaluation  Patient identified by MRN, date of birth, ID band Patient awake    Reviewed: Allergy & Precautions, NPO status , Patient's Chart, lab work & pertinent test results  Airway Mallampati: III  TM Distance: >3 FB Neck ROM: Full    Dental  (+) Edentulous Upper, Edentulous Lower   Pulmonary neg pulmonary ROS,    Pulmonary exam normal breath sounds clear to auscultation       Cardiovascular hypertension, Pt. on home beta blockers and Pt. on medications + CAD, + Past MI, + Cardiac Stents, + Peripheral Vascular Disease and +CHF  Normal cardiovascular exam Rhythm:Regular Rate:Normal  ECG: ST, rate 102. Sinus tachycardia with occasional Premature ventricular complexes Rightward axis  ECHO: 1. Left ventricular ejection fraction, by visual estimation, is 60 to 65%. The left ventricle has normal function. There is severely increased left ventricular hypertrophy. 2. Definity contrast agent was given IV to delineate the left ventricular endocardial borders. 3. Left ventricular diastolic parameters are indeterminate. 4. Global right ventricle has normal systolic function.The right ventricular size is normal. No increase in right ventricular wall thickness. 5. Left atrial size was normal. 6. Right atrial size was normal. 7. Presence of pericardial fat pad. 8. Trivial pericardial effusion is present. 9. Severe mitral annular calcification. 10. The mitral valve is abnormal. No evidence of mitral valve regurgitation. Moderate mitral stenosis secondary to annular calcification. MG 7 at HR 89 bpm, MVA 1.4 cm2 by continuity equation 11. The tricuspid valve is normal in structure. Tricuspid valve regurgitation is not demonstrated. 12. There is Moderate calcification of the aortic valve. 13. The aortic valve is abnormal. Aortic valve regurgitation is trivial. Mild to moderate aortic valve sclerosis/calcification without any  evidence of aortic stenosis. Vmax 1.8, MG 7 14. The pulmonic valve was not well visualized. Pulmonic valve regurgitation is not visualized. 15. There is mild dilatation of the ascending aorta measuring 37 mm. 16. TR signal is inadequate for assessing pulmonary artery systolic pressure.   Neuro/Psych negative neurological ROS  negative psych ROS   GI/Hepatic negative GI ROS, Neg liver ROS,   Endo/Other  diabetes, Insulin DependentMorbid obesity  Renal/GU CRFRenal disease     Musculoskeletal  (+) Arthritis ,   Abdominal (+) + obese,   Peds  Hematology  (+) anemia , HLD   Anesthesia Other Findings   Reproductive/Obstetrics                            Anesthesia Physical Anesthesia Plan  ASA: III  Anesthesia Plan: General   Post-op Pain Management:    Induction: Intravenous  PONV Risk Score and Plan: 3 and Ondansetron, Dexamethasone and Treatment may vary due to age or medical condition  Airway Management Planned: Oral ETT  Additional Equipment:   Intra-op Plan:   Post-operative Plan: Extubation in OR  Informed Consent: I have reviewed the patients History and Physical, chart, labs and discussed the procedure including the risks, benefits and alternatives for the proposed anesthesia with the patient or authorized representative who has indicated his/her understanding and acceptance.     Consent reviewed with POA  Plan Discussed with: CRNA  Anesthesia Plan Comments: (Anesthetic plan discussed with daughter)       Anesthesia Quick Evaluation

## 2019-02-17 NOTE — Transfer of Care (Signed)
Immediate Anesthesia Transfer of Care Note  Patient: Danielle [name]  Procedure(s) Performed: INTRAMEDULLARY (IM) NAIL RIGHT INTERTROCHANTERIC (Right )  Patient Location: PACU  Anesthesia Type:General  Level of Consciousness: awake, alert  and sedated  Airway & Oxygen Therapy: Patient connected to nasal cannula oxygen  Post-op Assessment: Post -op Vital signs reviewed and stable  Post vital signs: stable  Last Vitals:  Vitals Value Taken Time  BP 108/54 02/17/19 1414  Temp    Pulse 85 02/17/19 1415  Resp 14 02/17/19 1415  SpO2 93 % 02/17/19 1415  Vitals shown include unvalidated device data.  Last Pain:  Vitals:   02/17/19 0753  TempSrc: Oral  PainSc:       Patients Stated Pain Goal: 2 ([id number])  Complications: No apparent anesthesia complications

## 2019-02-18 ENCOUNTER — Encounter (HOSPITAL_COMMUNITY): Payer: Self-pay | Admitting: Orthopedic Surgery

## 2019-02-18 DIAGNOSIS — S72001A Fracture of unspecified part of neck of right femur, initial encounter for closed fracture: Secondary | ICD-10-CM

## 2019-02-18 LAB — PROTEIN ELECTROPHORESIS, SERUM
A/G Ratio: 0.7 (ref 0.7–1.7)
Albumin ELP: 2.8 g/dL — ABNORMAL LOW (ref 2.9–4.4)
Alpha-1-Globulin: 0.3 g/dL (ref 0.0–0.4)
Alpha-2-Globulin: 1.2 g/dL — ABNORMAL HIGH (ref 0.4–1.0)
Beta Globulin: 0.9 g/dL (ref 0.7–1.3)
Gamma Globulin: 1.5 g/dL (ref 0.4–1.8)
Globulin, Total: 4 g/dL — ABNORMAL HIGH (ref 2.2–3.9)
Total Protein ELP: 6.8 g/dL (ref 6.0–8.5)

## 2019-02-18 LAB — KAPPA/LAMBDA LIGHT CHAINS
Kappa free light chain: 104.6 mg/L — ABNORMAL HIGH (ref 3.3–19.4)
Kappa, lambda light chain ratio: 1.53 (ref 0.26–1.65)
Lambda free light chains: 68.4 mg/L — ABNORMAL HIGH (ref 5.7–26.3)

## 2019-02-18 LAB — RENAL FUNCTION PANEL
Albumin: 2.6 g/dL — ABNORMAL LOW (ref 3.5–5.0)
Anion gap: 12 (ref 5–15)
BUN: 80 mg/dL — ABNORMAL HIGH (ref 8–23)
CO2: 26 mmol/L (ref 22–32)
Calcium: 8 mg/dL — ABNORMAL LOW (ref 8.9–10.3)
Chloride: 103 mmol/L (ref 98–111)
Creatinine, Ser: 3.31 mg/dL — ABNORMAL HIGH (ref 0.44–1.00)
GFR calc Af Amer: 14 mL/min — ABNORMAL LOW (ref >60)
GFR calc non Af Amer: 12 mL/min — ABNORMAL LOW (ref >60)
Glucose, Bld: 240 mg/dL — ABNORMAL HIGH (ref 70–99)
Phosphorus: 5.3 mg/dL — ABNORMAL HIGH (ref 2.5–4.6)
Potassium: 4.2 mmol/L (ref 3.5–5.1)
Sodium: 141 mmol/L (ref 135–145)

## 2019-02-18 LAB — IRON AND TIBC
Iron: 19 ug/dL — ABNORMAL LOW (ref 28–170)
Saturation Ratios: 10 % — ABNORMAL LOW (ref 10.4–31.8)
TIBC: 186 ug/dL — ABNORMAL LOW (ref 250–450)
UIBC: 167 ug/dL

## 2019-02-18 LAB — CBC
HCT: 25.7 % — ABNORMAL LOW (ref 36.0–46.0)
Hemoglobin: 7.9 g/dL — ABNORMAL LOW (ref 12.0–15.0)
MCH: 29.9 pg (ref 26.0–34.0)
MCHC: 30.7 g/dL (ref 30.0–36.0)
MCV: 97.3 fL (ref 80.0–100.0)
Platelets: 179 10*3/uL (ref 150–400)
RBC: 2.64 MIL/uL — ABNORMAL LOW (ref 3.87–5.11)
RDW: 13.9 % (ref 11.5–15.5)
WBC: 9.4 10*3/uL (ref 4.0–10.5)
nRBC: 0 % (ref 0.0–0.2)

## 2019-02-18 LAB — GLUCOSE, CAPILLARY
Glucose-Capillary: 204 mg/dL — ABNORMAL HIGH (ref 70–99)
Glucose-Capillary: 200 mg/dL — ABNORMAL HIGH (ref 70–99)
Glucose-Capillary: 299 mg/dL — ABNORMAL HIGH (ref 70–99)
Glucose-Capillary: 284 mg/dL — ABNORMAL HIGH (ref 70–99)
Glucose-Capillary: 266 mg/dL — ABNORMAL HIGH (ref 70–99)

## 2019-02-18 LAB — HEMOGLOBIN AND HEMATOCRIT, BLOOD
HCT: 24.6 % — ABNORMAL LOW (ref 36.0–46.0)
Hemoglobin: 7.6 g/dL — ABNORMAL LOW (ref 12.0–15.0)

## 2019-02-18 MED ORDER — SODIUM CHLORIDE 0.9 % IV SOLN
510.0000 mg | Freq: Once | INTRAVENOUS | Status: AC
  Administered 2019-02-18: 510 mg via INTRAVENOUS
  Filled 2019-02-18: qty 17

## 2019-02-18 MED ORDER — TORSEMIDE 20 MG PO TABS
40.0000 mg | ORAL_TABLET | Freq: Every day | ORAL | Status: DC
  Administered 2019-02-19 – 2019-02-21 (×3): 40 mg via ORAL
  Filled 2019-02-18 (×4): qty 2

## 2019-02-18 NOTE — Care Management (Cosign Needed Addendum)
    Durable Medical Equipment  (From admission, onward)         Start     Ordered   02/18/19 1409  For home use only DME 3 n 1  Once     02/18/19 1408   02/18/19 1408  For home use only DME Walker rolling  Once    Question:  Patient needs a walker to treat with the following condition  Answer:  Weakness   02/18/19 1408   02/18/19 1408  For home use only DME standard manual wheelchair with seat cushion  Once    Comments: Patient suffers from weakness which impairs their ability to perform daily activities like ADLs in the home.  A walking aid will not resolve issue with performing activities of daily living. A wheelchair will allow patient to safely perform daily activities. Patient can safely propel the wheelchair in the home or has a caregiver who can provide assistance. Length of need 6 months. Accessories: elevating leg rests (ELRs), wheel locks, extensions and anti-tippers.   02/18/19 1408   02/18/19 1407  For home use only DME Hospital bed  Once    Question Answer Comment  Length of Need 6 Months   Bed type Semi-electric      02/18/19 1406  Patient suffers from s/p R femur IM fixation which impairs their ability to perform daily activities like ADLs in the home. Patient will need frequent repositioning not attainable in a normal bed.

## 2019-02-18 NOTE — Plan of Care (Signed)
  Problem: Pain Management: Goal: Pain level will decrease Outcome: Progressing   Problem: Education: Goal: Verbalization of understanding the information provided (i.e., activity precautions, restrictions, etc) will improve Outcome: Progressing   Problem: Safety: Goal: Ability to remain free from injury will improve Outcome: Progressing   Problem: Pain Managment: Goal: General experience of comfort will improve Outcome: Progressing   Problem: Elimination: Goal: Will not experience complications related to bowel motility Outcome: Progressing   Problem: Nutrition: Goal: Adequate nutrition will be maintained Outcome: Progressing

## 2019-02-18 NOTE — Evaluation (Signed)
Physical Therapy Evaluation Patient Details Name: Danielle [name] MRN: [id number] DOB: 25-Mar-1935 Today's Date: 02/18/2019   History of Present Illness  Danielle [name] is an 84 y.o. female with multiple underlying chronic medical comorbidities including insulin-dependent diabetes, hypertension, chronic kidney disease, and coronary artery disease status post previous stenting, presented to the ER today after a mechanical fall 2 days prior to admission with subsequent inability to bear weight and ongoing complaints of right hip pain. Pt is now s/p R femur IM fixation (02/17/19)  Clinical Impression  Pt admitted with above diagnosis.  Pt currently with functional limitations due to the deficits listed below (see PT Problem List). Pt will benefit from skilled PT to increase their independence and safety with mobility to allow discharge to the venue listed below.  Pt resistant at times with therapy and required TOT A of 2 to transfer to EOB and to return supine.  Daughter states pt is fairly sedentary and does not move much at home. Family would like pt to return home. Normally I would recommend someone at this level to go to SNF, but due to language barrier and COVID restrictions, think home with family and support would be reasonable.  Family seems to understand that they will need more support.  Recommend HHPT, [place] aides, RW, w/c, and hospital bed.  Due to having steps to enter home, she will more than likely need ambulance transfer to return home.     Follow Up Recommendations Home health PT;Supervision/Assistance - 24 hour(family declines SNF)    Equipment Recommendations  Rolling walker with 5" wheels;3in1 (PT);Wheelchair (measurements PT);Hospital bed;Wheelchair cushion (measurements PT)    Recommendations for Other Services       Precautions / Restrictions Precautions Precautions: Fall Restrictions Weight Bearing Restrictions: Yes RLE Weight Bearing: Weight bearing as tolerated       Mobility  Bed Mobility Overal bed mobility: Needs Assistance Bed Mobility: Supine to Sit;Sit to Supine     Supine to sit: Total assist;+2 for physical assistance Sit to supine: Total assist;+2 for physical assistance   General bed mobility comments: Helicopter technique with use of bed pad to get pt to EOB.  Pt resistant at times and pushing back at times.  Pt unable to attempt scooting.  Pt had BM on pad and returned supine for cleaning.  Transfers                 General transfer comment: Not attempted due to weakness and needing to to be cleaned, but appears that she would need TOT A.  Ambulation/Gait             General Gait Details: NT  Stairs            Wheelchair Mobility    Modified Rankin (Stroke Patients Only)       Balance Overall balance assessment: History of Falls;Needs assistance Sitting-balance support: Feet supported Sitting balance-Leahy Scale: Poor       Standing balance-Leahy Scale: Zero                               Pertinent Vitals/Pain Pain Assessment: Faces Faces Pain Scale: Hurts whole lot Pain Location: Grimacing with bed mobility Pain Descriptors / Indicators: Guarding;Grimacing;Operative site guarding Pain Intervention(s): Limited activity within patient's tolerance;Premedicated before session;Monitored during session;Repositioned;Ice applied    Home Living Family/patient expects to be discharged to:: Private residence Living Arrangements: Children Available Help at Discharge: Available 24 hours/day Type  of Home: House Home Access: Stairs to enter Entrance Stairs-Rails: Psychiatric nurse of Steps: 3 Home Layout: Able to live on main level with bedroom/bathroom Home Equipment: Grab bars - toilet;Walker - 4 wheels Additional Comments: Grab bar near bed? Daughter reports she pulls up on something to help her stand    Prior Function Level of Independence: Needs assistance   Gait /  Transfers Assistance Needed: A with transfers and amb with rollator  ADL's / Homemaking Assistance Needed: A with toileting, dressing, bathing        Hand Dominance        Extremity/Trunk Assessment        Lower Extremity Assessment Lower Extremity Assessment: Generalized weakness;LLE deficits/detail;RLE deficits/detail RLE Deficits / Details: Limited ROM RLE: Unable to fully assess due to pain LLE Deficits / Details: Limited ROM despite being non-operative leg LLE: Unable to fully assess due to pain       Communication   Communication: Prefers language other than [place];Interpreter utilized  Cognition Arousal/Alertness: Awake/alert Behavior During Therapy: WFL for tasks assessed/performed Overall Cognitive Status: Difficult to assess                                 General Comments: Daughter states pt does not have dementia      General Comments General comments (skin integrity, edema, etc.): Spoke to daughter about home needs as the family would like to take pt home.    Exercises     Assessment/Plan    PT Assessment Patient needs continued PT services  PT Problem List Decreased strength;Decreased range of motion;Decreased activity tolerance;Decreased balance;Decreased mobility;Decreased knowledge of use of DME;Decreased safety awareness;Decreased knowledge of precautions;Pain       PT Treatment Interventions DME instruction;Functional mobility training;Therapeutic activities;Therapeutic exercise;Balance training;Neuromuscular re-education    PT Goals (Current goals can be found in the Care Plan section)  Acute Rehab PT Goals Patient Stated Goal: Pt to go home with family support PT Goal Formulation: With family Time For Goal Achievement: 03/04/19 Potential to Achieve Goals: Fair    Frequency Min 5X/week   Barriers to discharge Inaccessible home environment steps to enter    Co-evaluation               AM-PAC PT "6 Clicks"  Mobility  Outcome Measure Help needed turning from your back to your side while in a flat bed without using bedrails?: Total Help needed moving from lying on your back to sitting on the side of a flat bed without using bedrails?: Total Help needed moving to and from a bed to a chair (including a wheelchair)?: Total Help needed standing up from a chair using your arms (e.g., wheelchair or bedside chair)?: Total Help needed to walk in hospital room?: Total Help needed climbing 3-5 steps with a railing? : Total 6 Click Score: 6    End of Session   Activity Tolerance: Patient limited by pain Patient left: in bed;with call bell/phone within reach;with bed alarm set Nurse Communication: Mobility status PT Visit Diagnosis: Other abnormalities of gait and mobility (R26.89);History of falling (Z91.81)    Time: [id number] PT Time Calculation (min) (ACUTE ONLY): 24 min   Charges:   PT Evaluation $PT Eval Moderate Complexity: 1 Mod PT Treatments $Therapeutic Activity: 8-22 mins        Penn Grissett L. Tamala Julian, [place] Pager [phone] 02/18/2019   Galen Manila 02/18/2019, 11:53 AM

## 2019-02-18 NOTE — Progress Notes (Signed)
Brief cardiology update:  Patient is postop R femur intramedullary fixation. Fluid status being managed by medicine and nephrology. Cardiology will sign off at this time, but please contact us with any questions or concerns.  [facility] will sign off.   Medication Recommendations:  Return to home medications when appropriate Other recommendations (labs, testing, etc):  none Follow up as an outpatient:  We will schedule her for follow up with Dr. Ellyn Hack. She has not been seen in office since 2018.

## 2019-02-18 NOTE — Progress Notes (Addendum)
PROGRESS NOTE    Danielle [name]  [id number] DOB: 03/07/1935 DOA: 02/14/2019 PCP: Jani Gravel, MD   Brief Narrative:  84 year old [name], lives with daughter, ambulates with help of walker, mostly sedentary, requires assistance for most ADLs, hard of hearing and refuses to wear hearing aid, PMH of NSTEMI 2012 when she presented with acute pulmonary edema, severe three-vessel CAD, s/p PCI, chronic diastolic CHF, stage III chronic kidney disease (unknown baseline), type II DM/IDDM on high doses of insulins, CVA, s/p left carotid stent, HLD, HTN, obesity, sustained mechanical fall at home 2 days PTA and presented to [facility] ED on 11/7 due to left hip pain and unable to weight-bear.  Admitted for acute right intertrochanteric hip fracture, orthopedics consulted and plan surgery possibly 02/16/2019.  Course complicated by acute on chronic kidney disease and anasarca, nephrology consulted.  Cardiology consulted for preop clearance.  S/p right hip fracture surgery 11/10.  11/11: Pt transitioned to oral torsemide by Nephrology today with first dose to be given on 11/12. PT evaluation with SNF recommendation, but family wants HH PT.  Assessment & Plan:   Active Problems:   Hip fracture (HCC)   Acute right trochanteric hip fracture  Sustained status post mechanical fall.  Patient was supposed to be for surgery 11/9.  However she was not medically stable for surgery and hence surgery was canceled.    After cardiology and nephrology clearance, determined to be intermediate risk, underwent right femur intramedullary fixation on 11/10.  Management per orthopedics.  Patient reported right shoulder pain on 11/8 and x-ray of right shoulder without acute findings.  Acute on stage 4 chronic kidney disease/anasarca  As per nephrology, creatinine 2.98 in August 2020 and hence suspect advanced stage IV CKD.  Hholding Lasix and ARB.  Nephrology follow-up appreciated.  Renal ultrasound unremarkable.     Etiology unclear.  Could be related to diabetic nephropathy and?  Nephrotic range proteinuria, CKD, CHF and hypoalbuminemia.  Follow daily BMP.  Also on sodium bicarbonate.  Oral torsemide starting 11/12  Outpatient follow-up with nephrology, [facility] which will be arranged.  Acute on chronic diastolic CHF  Lacks respiratory symptoms but significant third spacing/volume overload on admission.  TTE 02/16/2019: LVEF 60-65%, severe LVH moderate MS no aortic stenosis.  Diuretic management as indicated above.  ARB on hold due to acute kidney injury.  Cardiology seen this admission.  On IV diuretics per nephrology.  Improving.  Acute respiratory failure with hypoxia  Secondary to decompensated CHF, possible OHS.  Oxygen saturations reportedly in the 70s when patient dislodged her nasal cannula oxygen this morning.  Saturating in the low 90s on 2 L/min yesterday.  Will need to reassess oxygen requirement prior to discharge.  CAD, s/p PCI (CFX and LAD) in 2012 in the setting of NSTEMI & acute diastolic CHF  No anginal symptoms and patient quite sedentary at home.  Noncompliant with diet.  Is on Brilinta without aspirin.  Continue atorvastatin, Imdur and metoprolol.  Brilinta on hold since 2 days prior to admission, was on hold for surgery.  Resume Brilinta when okay with orthopedics. Evaluation still pending on 11/11.  Poorly controlled type II DM with renal complications, obesity and hyperlipidemia.  Patient on high doses of N and R insulin at home, noncompliant with diet, does not check CBGs regularly.  Follows with Dr. Chalmers Cater, Endocrinology  A1c 7.5.  Adjusted insulins this admission.  Currently on Levemir 35 units twice daily, NovoLog moderate sensitivity SSI and mealtime NovoLog 6 units 3 times daily.  Better controlled than before, still mildly uncontrolled.  DM coordinator input appreciated.    Continue to monitor on current regimen overnight and consider increasing  Levemir to 40 units twice if persistently elevated.  Essential hypertension  Reasonably controlled on Imdur twice daily and metoprolol 25 mg twice daily.  Added as needed IV hydralazine.  Hyperlipidemia  Continue atorvastatin.  PAD  History of left carotid stent remotely in [place].  Anemia in CKD  Hemoglobin stable/9.3.  Morbid obesity/Body mass index is 42.41 kg/m.  Hard of hearing  Chest x-ray abnormality  Chest x-ray 11/7: Abnormal appearance of left hilum and left lung base of uncertain significance possibly related to chronic scarring and/or airspace disease.  Recommended PA and lateral x-rays, perform when able.  Follow-up chest x-ray two-view 11/8: Suggestive of pulmonary edema.  Asymptomatic bacteriuria   DVT prophylaxis:Heparin Code Status: Full Family Communication: Daughter at bedside Disposition Plan: [facility] PT after equipment delivered with hopeful dc in am.   Consultants:   Orthopedics  Cardiology  Nephrology  Procedures:   IM fixation R femur 11/10  Antimicrobials:  Anti-infectives (From admission, onward)   Start     Dose/Rate Route Frequency Ordered Stop   02/17/19 1530  ceFAZolin (ANCEF) IVPB 2g/100 mL premix     2 g 200 mL/hr over 30 Minutes Intravenous Every 24 hours 02/17/19 1519 02/19/19 1529   02/16/19 0600  ceFAZolin (ANCEF) IVPB 2g/100 mL premix  Status:  Discontinued     2 g 200 mL/hr over 30 Minutes Intravenous On call to O.R. 02/15/19 2006 02/17/19 0559       Subjective: Patient seen and evaluated today with no new acute complaints or concerns. No acute concerns or events noted overnight. Resting comfortably.  Objective: Vitals:   02/18/19 0253 02/18/19 0600 02/18/19 0729 02/18/19 0848  BP: (!) 107/57  (!) 113/45 (!) 117/59  Pulse: 85  91 85  Resp:   14 15  Temp: 98.9 F (37.2 C)  98.2 F (36.8 C)   TempSrc: Oral  Oral   SpO2: 96%  94% 95%  Weight:  86.8 kg    Height:        Intake/Output Summary (Last  24 hours) at 02/18/2019 1447 Last data filed at 02/18/2019 0600 Gross per 24 hour  Intake 216 ml  Output 1600 ml  Net -1384 ml   Filed Weights   02/16/19 0000 02/18/19 0600  Weight: 85.8 kg 86.8 kg    Examination:  General exam: Appears calm and comfortable  Respiratory system: Clear to auscultation. Respiratory effort normal. 2L [place]. Cardiovascular system: S1 & S2 heard, RRR. No JVD, murmurs, rubs, gallops or clicks. No pedal edema. Gastrointestinal system: Abdomen is nondistended, soft and nontender. No organomegaly or masses felt. Normal bowel sounds heard. Central nervous system: Alert and oriented. No focal neurological deficits. Extremities: Symmetric 5 x 5 power. Skin: No rashes, lesions or ulcers Psychiatry: Judgement and insight appear normal. Mood & affect appropriate.     Data Reviewed: I have personally reviewed following labs and imaging studies  CBC: Recent Labs  Lab 02/14/19 1436 02/16/19 0223 02/17/19 0557 02/18/19 0418 02/18/19 1418  WBC 11.2* 9.7 10.9* 9.4  --   HGB 9.8* 8.9* 9.3* 7.9* 7.6*  HCT 31.3* 29.5* 30.9* 25.7* 24.6*  MCV 96.0 98.0 96.3 97.3  --   PLT 154 163 185 179  --    Basic Metabolic Panel: Recent Labs  Lab 02/14/19 1436 02/15/19 0514 02/16/19 0223 02/17/19 0557 02/18/19 0418  NA 140 143  140 141 141  K 4.7 4.9 4.7 4.7 4.2  CL 106 110 107 104 103  CO2 21* 23 23 27 26   GLUCOSE 412* 289* 361* 254* 240*  BUN 65* 62* 62* 74* 80*  CREATININE 3.35* 3.12* 3.05* 3.21* 3.31*  CALCIUM 8.8* 8.6* 8.6* 9.0 8.0*  PHOS  --   --   --  5.1* 5.3*   GFR: Estimated Creatinine Clearance: 11.7 mL/min (A) (by C-G formula based on SCr of 3.31 mg/dL (H)). Liver Function Tests: Recent Labs  Lab 02/15/19 0514 02/17/19 0557 02/18/19 0418  AST 17  --   --   ALT 11  --   --   ALKPHOS 169*  --   --   BILITOT 0.5  --   --   PROT 7.6  --   --   ALBUMIN 3.0* 2.9* 2.6*   No results for input(s): LIPASE, AMYLASE in the last 168 hours. No results  for input(s): AMMONIA in the last 168 hours. Coagulation Profile: Recent Labs  Lab 02/14/19 1658  INR 1.2   Cardiac Enzymes: No results for input(s): CKTOTAL, CKMB, CKMBINDEX, TROPONINI in the last 168 hours. BNP (last 3 results) No results for input(s): PROBNP in the last 8760 hours. HbA1C: No results for input(s): HGBA1C in the last 72 hours. CBG: Recent Labs  Lab 02/17/19 1732 02/17/19 2257 02/18/19 0442 02/18/19 0840 02/18/19 1143  GLUCAP 191* 253* 204* 200* 299*   Lipid Profile: No results for input(s): CHOL, HDL, LDLCALC, TRIG, CHOLHDL, LDLDIRECT in the last 72 hours. Thyroid Function Tests: No results for input(s): TSH, T4TOTAL, FREET4, T3FREE, THYROIDAB in the last 72 hours. Anemia Panel: Recent Labs    02/18/19 0418  TIBC 186*  IRON 19*   Sepsis Labs: No results for input(s): PROCALCITON, LATICACIDVEN in the last 168 hours.  Recent Results (from the past 240 hour(s))  SARS CORONAVIRUS 2 (TAT 6-24 HRS) Nasopharyngeal Nasopharyngeal Swab     Status: None   Collection Time: 02/14/19  6:16 PM   Specimen: Nasopharyngeal Swab  Result Value Ref Range Status   SARS Coronavirus 2 NEGATIVE NEGATIVE Final    Comment: (NOTE) SARS-CoV-2 target nucleic acids are NOT DETECTED. The SARS-CoV-2 RNA is generally detectable in upper and lower respiratory specimens during the acute phase of infection. Negative results do not preclude SARS-CoV-2 infection, do not rule out co-infections with other pathogens, and should not be used as the sole basis for treatment or other patient management decisions. Negative results must be combined with clinical observations, patient history, and epidemiological information. The expected result is Negative. Fact Sheet for Patients: SugarRoll.be Fact Sheet for Healthcare Providers: https://www.woods-mathews.com/ This test is not yet approved or cleared by the [place] FDA and  has been  authorized for detection and/or diagnosis of SARS-CoV-2 by FDA under an Emergency Use Authorization (EUA). This EUA will remain  in effect (meaning this test can be used) for the duration of the COVID-19 declaration under Section 56 4(b)(1) of the Act, 21 U.S.C. section 360bbb-3(b)(1), unless the authorization is terminated or revoked sooner. Performed at [facility], [place] [address], [place], [place] [address]   Surgical pcr screen     Status: Abnormal   Collection Time: 02/14/19  9:01 PM   Specimen: Nasal Mucosa; Nasal Swab  Result Value Ref Range Status   MRSA, PCR NEGATIVE NEGATIVE Final   Staphylococcus aureus POSITIVE (A) NEGATIVE Final    Comment: (NOTE) The Xpert SA Assay (FDA approved for NASAL specimens in  patients 29 years of age and older), is one component of a comprehensive surveillance program. It is not intended to diagnose infection nor to guide or monitor treatment. Performed at [facility], [place] [address], [place], [place] [address]          Radiology Studies: Pelvis Portable  Result Date: 02/17/2019 CLINICAL DATA:  ORIF right femur. EXAM: PORTABLE PELVIS 1-2 VIEWS COMPARISON:  Prior right hip study same day. FINDINGS: ORIF right hip. Hardware intact. Anatomic alignment one-view. Degenerative changes left hip. Aortoiliac and peripheral vascular calcification. IMPRESSION: ORIF right hip with anatomic alignment on one view. Electronically Signed   By: Marcello Moores  Register   On: 02/17/2019 15:09   Dg C-arm 1-60 Min  Result Date: 02/17/2019 CLINICAL DATA:  ORIF. EXAM: RIGHT FEMUR 2 VIEWS; DG C-ARM 1-60 MIN COMPARISON:  02/14/2019. FINDINGS: ORIF right femur.  Hardware intact.  Anatomic alignment. IMPRESSION: ORIF right femur.  Anatomic alignment. Electronically Signed   By: Marcello Moores  Register   On: 02/17/2019 14:02   Dg Femur, Min 2 Views Right  Result Date: 02/17/2019 CLINICAL DATA:  ORIF. EXAM: RIGHT FEMUR 2 VIEWS; DG C-ARM 1-60 MIN COMPARISON:   02/14/2019. FINDINGS: ORIF right femur.  Hardware intact.  Anatomic alignment. IMPRESSION: ORIF right femur.  Anatomic alignment. Electronically Signed   By: Marcello Moores  Register   On: 02/17/2019 14:02   Dg Femur Port, Min 2 Views Right  Result Date: 02/17/2019 CLINICAL DATA:  84 year old female with right intertrochanteric fracture status post ORIF. EXAM: RIGHT FEMUR PORTABLE 2 VIEW COMPARISON:  Right hip radiograph dated 02/14/2019 FINDINGS: Status post ORIF of the right femoral fracture with an intramedullary rod and transcervical screw. The major fracture fragment appear in anatomic alignment. The orthopedic hardware appears intact. There is a mildly displaced fracture fragment of the lesser trochanter. There is no dislocation. There is moderate arthritic changes of the right hip. Moderate to severe arthritic changes of the noted. Probable small suprapatellar effusion. Vascular calcifications. The soft tissues are grossly unremarkable. IMPRESSION: Status post ORIF of the right femoral fracture. No needed complication. Electronically Signed   By: Anner Crete M.D.   On: 02/17/2019 15:15        Scheduled Meds: . atorvastatin  40 mg Oral QHS  . calcitRIOL  0.25 mcg Oral QODAY  . Chlorhexidine Gluconate Cloth  6 each Topical Daily  . docusate sodium  100 mg Oral BID  . heparin injection (subcutaneous)  5,000 Units Subcutaneous Q12H  . influenza vaccine adjuvanted  0.5 mL Intramuscular Tomorrow-1000  . insulin aspart  0-15 Units Subcutaneous TID WC  . insulin aspart  0-5 Units Subcutaneous QHS  . insulin aspart  6 Units Subcutaneous TID WC  . insulin detemir  35 Units Subcutaneous BID  . isosorbide mononitrate  30 mg Oral Q breakfast  . isosorbide mononitrate  60 mg Oral QPC lunch  . metoprolol tartrate  25 mg Oral BID  . mupirocin ointment  1 application Nasal BID  . pantoprazole  40 mg Oral Daily  . polyethylene glycol  17 g Oral Daily  . senna  2 tablet Oral Daily  . sodium  bicarbonate  325 mg Oral Q48H  . torsemide  40 mg Oral Daily   Continuous Infusions: .  ceFAZolin (ANCEF) IV 2 g (02/17/19 1606)     LOS: 4 days    Time spent: 30 minutes    Jannat Rosemeyer Darleen Crocker, DO [facility] Pager [phone]  If 7PM-7AM, please contact night-coverage [email or web address] Password [facility] 02/18/2019,  2:47 PM

## 2019-02-18 NOTE — Progress Notes (Signed)
Admit: 02/14/2019 LOS: 24  84 year old female with advanced CKD, CAD, hyperlipidemia, admitted with hip fracture on the right side  Subjective:  Marland Kitchen Surgical repair of hip fracture yesterday, uneventful  . no acute events overnight . 2.3 L urine output yesterday, on IV Lasix . Ate breakfast this morning . Creatinine stable at 3.3, K4.2  11/10 0701 - 11/11 0700 In: 1266 [P.O.:90; I.V.:800; IV Piggyback:256] Out: 2400 [Urine:2300; Blood:100]  Filed Weights   02/16/19 0000 02/18/19 0600  Weight: 85.8 kg 86.8 kg    Scheduled Meds: . atorvastatin  40 mg Oral QHS  . calcitRIOL  0.25 mcg Oral QODAY  . Chlorhexidine Gluconate Cloth  6 each Topical Daily  . docusate sodium  100 mg Oral BID  . heparin injection (subcutaneous)  5,000 Units Subcutaneous Q12H  . influenza vaccine adjuvanted  0.5 mL Intramuscular Tomorrow-1000  . insulin aspart  0-15 Units Subcutaneous TID WC  . insulin aspart  0-5 Units Subcutaneous QHS  . insulin aspart  6 Units Subcutaneous TID WC  . insulin detemir  35 Units Subcutaneous BID  . isosorbide mononitrate  30 mg Oral Q breakfast  . isosorbide mononitrate  60 mg Oral QPC lunch  . metoprolol tartrate  25 mg Oral BID  . mupirocin ointment  1 application Nasal BID  . pantoprazole  40 mg Oral Daily  . polyethylene glycol  17 g Oral Daily  . senna  2 tablet Oral Daily  . sodium bicarbonate  325 mg Oral Q48H  . torsemide  40 mg Oral Daily   Continuous Infusions: .  ceFAZolin (ANCEF) IV 2 g (02/17/19 1606)   PRN Meds:.acetaminophen, bisacodyl, HYDROmorphone (DILAUDID) injection, menthol-cetylpyridinium **OR** phenol, metoCLOPramide **OR** metoCLOPramide (REGLAN) injection, ondansetron **OR** ondansetron (ZOFRAN) IV, oxyCODONE  Current Labs: reviewed    Physical Exam:  Blood pressure (!) 117/59, pulse 85, temperature 98.2 F (36.8 C), temperature source Oral, resp. rate 15, height 4\' 8"  (1.422 m), weight 86.8 kg, SpO2 95 %. NAD, lying flat in bed,  obese Regular, normal S1-S2 Clear breath sounds bilaterally Trace peripheral edema No rashes or lesions Awake alert, nonfocal  A 1. Renal failure, appears this is all CKD, advanced stage IV.  Creatinine was 2.98 in August of this year.  Adequate urine output on IV Lasix.  Likely etiology is multifactorial, including DM2 2. Status post right hip fracture following fall,surgical repair 11/10 3. History of CAD 4. chronic diastolic heart failure  5. DM2 6. History of stroke with carotid stenting in [place] 7. Anemia, mild, normocytic  P  . Change to torsemide 40 mg daily . Will need outpt f/u at [facility], will start making appt . Daily weights, Daily Renal Panel, Strict I/Os, Avoid nephrotoxins (NSAIDs, judicious IV Contrast)   Pearson Grippe MD 02/18/2019, 1:02 PM  Recent Labs  Lab 02/16/19 0223 02/17/19 0557 02/18/19 0418  NA 140 141 141  K 4.7 4.7 4.2  CL 107 104 103  CO2 23 27 26   GLUCOSE 361* 254* 240*  BUN 62* 74* 80*  CREATININE 3.05* 3.21* 3.31*  CALCIUM 8.6* 9.0 8.0*  PHOS  --  5.1* 5.3*   Recent Labs  Lab 02/16/19 0223 02/17/19 0557 02/18/19 0418  WBC 9.7 10.9* 9.4  HGB 8.9* 9.3* 7.9*  HCT 29.5* 30.9* 25.7*  MCV 98.0 96.3 97.3  PLT 163 185 179

## 2019-02-18 NOTE — Progress Notes (Signed)
Results for MIRAE, LENTON (MRN [id number]) as of 02/18/2019 12:20  Ref. Range 02/17/2019 17:32 02/17/2019 22:57 02/18/2019 04:42 02/18/2019 08:40 02/18/2019 11:43  Glucose-Capillary Latest Ref Range: 70 - 99 mg/dL 191 (H) 253 (H) 204 (H) 200 (H) 299 (H)  Noted that blood sugars continue to be greater than 180 mg/dl.  Recommend increasing Levemir to 38 units BID and continue Novolog MODERATE correction scale TID & HS scale, and Novolog 6 units TID as meal coverage.    Harvel Ricks RN BSN CDE Diabetes Coordinator Pager: [phone]  8am-5pm

## 2019-02-18 NOTE — Progress Notes (Addendum)
Patient had not voided since foley cath was removed around 3pm today. Bladder scan showed 563, MD notified & I&O completed with 450 output. Patient is on purwick. Will continue to monitor.   [id number]: Bladder scan 239mL. Has voided a small amount in purwick canister.

## 2019-02-18 NOTE — TOC Initial Note (Addendum)
Transition of Care [facility]) - Initial/Assessment Note    Patient Details  Name: Danielle [name] MRN: [id number] Date of Birth: 02/12/1935  Transition of Care [facility]) CM/SW Contact:    Midge Minium RN, BSN, NCM-BC, ACM-RN [phone] Phone Number: 02/18/2019, 3:58 PM  Clinical Narrative:                 Patients daughter, Danielle [name], requesting to speak to this CM to discuss the POC; patient lives at home with daughter who provides assistance. Patient is an 84 yo female, s/p right femur IM fixation. PT eval completed with SNF declined; daughter would like for the patient to return home with HH/DME with 24hr assistance to be provided by family. CMS HH Compare and DME list provided with [facility] and [employer] for DME needs. Patients daughter would like to discuss the hospital bed with the DME company prior to making a decision on the type of hospital bed. [place] referral given to [facility] liaison); DME referral given to [place] ([place] liaison); AVS updated. CM team will continue to follow for TOC needs.   Expected Discharge Plan: [place] Barriers to Discharge: Continued Medical Work up   Patient Goals and CMS Choice Patient states their goals for this hospitalization and ongoing recovery are:: "to go home with family" CMS Medicare.gov Compare Post Acute Care list provided to:: Patient Represenative (must comment)([place] Hae) Choice offered to / list presented to : Adult Children([place] Hae)  Expected Discharge Plan and Services Expected Discharge Plan: [place] In-house Referral: NA Discharge Planning Services: CM Consult Post Acute Care Choice: Durable Medical Equipment, Home Health Living arrangements for the past 2 months: [place]                 DME Arranged: Hospital bed, Wheelchair manual, Bedside commode, Walker rolling DME Agency: [employer] Date DME Agency Contacted: 02/18/19 Time DME Agency Contacted: [phone] Representative spoke with  at DME Agency: [place]: RN, Disease Management, PT, OT, Nurse's Aide, Refused SNF, Social Work [employer] Agency: [facility] [employer] [facility] [employer] Date [place]: 02/18/19 Time [place]: 1603 Representative spoke with at [place]: [place] (liaison)  Prior Living Arrangements/Services Living arrangements for the past 2 months: [place] with:: Self, Adult Children Patient language and need for interpreter reviewed:: Yes Do you feel safe going back to the place where you live?: Yes      Need for Family Participation in Patient Care: Yes (Comment) Care giver support system in place?: Yes (comment) Current home services: DME Criminal Activity/Legal Involvement Pertinent to Current Situation/Hospitalization: No - Comment as needed  Activities of Daily Living Home Assistive Devices/Equipment: Walker (specify type), Other (Comment)(4 wheel walker) ADL Screening (condition at time of admission) Patient's cognitive ability adequate to safely complete daily activities?: Yes Is the patient deaf or have difficulty hearing?: Yes Does the patient have difficulty seeing, even when wearing glasses/contacts?: Yes Does the patient have difficulty concentrating, remembering, or making decisions?: Yes Patient able to express need for assistance with ADLs?: No Does the patient have difficulty dressing or bathing?: Yes Independently performs ADLs?: No Communication: Independent Does the patient have difficulty walking or climbing stairs?: Yes Weakness of Legs: Both Weakness of Arms/Hands: Both  Permission Sought/Granted Permission sought to share information with : Case Manager Permission granted to share information with : Yes, Verbal Permission Granted     Permission granted to share info  w AGENCY: HH/DME agencies        Emotional Assessment       Orientation: : Oriented to Self, Oriented to Place, Oriented to  Time, Oriented to  Situation Alcohol / Substance Use: Not Applicable Psych Involvement: No (comment)  Admission diagnosis:  Dehydration [E86.0] Fall [W19.XXXA] Hyperglycemia [R73.9] Displaced intertrochanteric fracture of right femur ([place]) [S72.141A] AKI (acute kidney injury) ([place]) [N17.9] Closed nondisplaced intertrochanteric fracture of right femur, initial encounter [facility]) [S72.144A] Patient Active Problem List   Diagnosis Date Noted  . Hip fracture ([place]) 02/14/2019  . Preoperative cardiovascular examination 01/07/2014  . DOE (dyspnea on exertion) 12/17/2013  . CAD (coronary artery disease), CFX (x2) and LAD PCI in setting of non-STEMI and acute diastolic heart failure 0000000    Class: History of  . History of: Non-ST elevated myocardial infarction (non-STEMI) - inferior lateral ST depression, peak troponin 16 03/07/2011    Class: Diagnosis of  . Chronic diastolic CHF (congestive heart failure), NYHA class 2 ([place]) 03/06/2011    Class: Diagnosis of  . Diabetes mellitus, type II, insulin dependent ([place]) 03/06/2011  . Essential hypertension 03/06/2011  . Dyslipidemia, goal LDL below 70 03/06/2011  . PVD (peripheral vascular disease) ([place]) 03/06/2011  . History of CVA (cerebrovascular accident) 03/06/2011  . Obesity (BMI 30-39.9) 03/06/2011  . Chronic renal insufficiency, stage III (moderate) 03/06/2011  . Bilateral knee pain 09/20/2010  . Knee osteoarthritis 09/20/2010  . History of stroke without residual deficits - status post Left Carotid Stent. 03/14/2004    Class: History of   PCP:  Jani Gravel, MD Pharmacy:   [facility] [address] Phone: [phone] Fax: [phone]  [facility] [place], [place] - [place] [address] [place] [address] Phone: [phone] Fax: [place], [place] - [place]  AT [address] [place] [address] Phone: [phone] Fax: [phone]     Social Determinants of Health ([place]) Interventions    Readmission Risk Interventions Readmission Risk Prevention Plan 02/18/2019  Transportation Screening Complete  PCP or Specialist Appt within 3-5 Days Complete  HRI or [place] Complete  Social Work Consult for [place] Planning/Counseling Complete  Palliative Care Screening Not Applicable  Medication Review Press photographer) Complete  Some recent data might be hidden

## 2019-02-19 LAB — CBC
HCT: 23.6 % — ABNORMAL LOW (ref 36.0–46.0)
Hemoglobin: 7.4 g/dL — ABNORMAL LOW (ref 12.0–15.0)
MCH: 29.8 pg (ref 26.0–34.0)
MCHC: 31.4 g/dL (ref 30.0–36.0)
MCV: 95.2 fL (ref 80.0–100.0)
Platelets: 181 10*3/uL (ref 150–400)
RBC: 2.48 MIL/uL — ABNORMAL LOW (ref 3.87–5.11)
RDW: 13.6 % (ref 11.5–15.5)
WBC: 10.3 10*3/uL (ref 4.0–10.5)
nRBC: 0.2 % (ref 0.0–0.2)

## 2019-02-19 LAB — RENAL FUNCTION PANEL
Albumin: 2.4 g/dL — ABNORMAL LOW (ref 3.5–5.0)
Anion gap: 12 (ref 5–15)
BUN: 87 mg/dL — ABNORMAL HIGH (ref 8–23)
CO2: 25 mmol/L (ref 22–32)
Calcium: 8.2 mg/dL — ABNORMAL LOW (ref 8.9–10.3)
Chloride: 102 mmol/L (ref 98–111)
Creatinine, Ser: 3.48 mg/dL — ABNORMAL HIGH (ref 0.44–1.00)
GFR calc Af Amer: 13 mL/min — ABNORMAL LOW (ref >60)
GFR calc non Af Amer: 12 mL/min — ABNORMAL LOW (ref >60)
Glucose, Bld: 270 mg/dL — ABNORMAL HIGH (ref 70–99)
Phosphorus: 4.6 mg/dL (ref 2.5–4.6)
Potassium: 4.2 mmol/L (ref 3.5–5.1)
Sodium: 139 mmol/L (ref 135–145)

## 2019-02-19 LAB — GLUCOSE, CAPILLARY
Glucose-Capillary: 238 mg/dL — ABNORMAL HIGH (ref 70–99)
Glucose-Capillary: 285 mg/dL — ABNORMAL HIGH (ref 70–99)
Glucose-Capillary: 253 mg/dL — ABNORMAL HIGH (ref 70–99)
Glucose-Capillary: 186 mg/dL — ABNORMAL HIGH (ref 70–99)

## 2019-02-19 LAB — PARATHYROID HORMONE, INTACT (NO CA): PTH: 132 pg/mL — ABNORMAL HIGH (ref 15–65)

## 2019-02-19 MED ORDER — DICLOFENAC SODIUM 1 % EX GEL
2.0000 g | Freq: Four times a day (QID) | CUTANEOUS | Status: DC | PRN
  Administered 2019-02-20: 2 g via TOPICAL
  Filled 2019-02-19: qty 100

## 2019-02-19 MED ORDER — TICAGRELOR 90 MG PO TABS
90.0000 mg | ORAL_TABLET | Freq: Two times a day (BID) | ORAL | Status: DC
  Administered 2019-02-19 – 2019-02-21 (×5): 90 mg via ORAL
  Filled 2019-02-19 (×6): qty 1

## 2019-02-19 MED ORDER — INSULIN ASPART 100 UNIT/ML ~~LOC~~ SOLN
12.0000 [IU] | Freq: Three times a day (TID) | SUBCUTANEOUS | Status: DC
  Administered 2019-02-19 – 2019-02-21 (×6): 12 [IU] via SUBCUTANEOUS

## 2019-02-19 MED ORDER — POLYSACCHARIDE IRON COMPLEX 150 MG PO CAPS
150.0000 mg | ORAL_CAPSULE | ORAL | Status: DC
  Administered 2019-02-19 – 2019-02-21 (×2): 150 mg via ORAL
  Filled 2019-02-19 (×3): qty 1

## 2019-02-19 MED ORDER — INSULIN DETEMIR 100 UNIT/ML ~~LOC~~ SOLN
38.0000 [IU] | Freq: Two times a day (BID) | SUBCUTANEOUS | Status: DC
  Administered 2019-02-19: 09:00:00 38 [IU] via SUBCUTANEOUS
  Filled 2019-02-19 (×2): qty 0.38

## 2019-02-19 MED ORDER — INSULIN ASPART 100 UNIT/ML ~~LOC~~ SOLN
8.0000 [IU] | Freq: Three times a day (TID) | SUBCUTANEOUS | Status: DC
  Administered 2019-02-19 (×2): 8 [IU] via SUBCUTANEOUS

## 2019-02-19 MED ORDER — INSULIN DETEMIR 100 UNIT/ML ~~LOC~~ SOLN
44.0000 [IU] | Freq: Two times a day (BID) | SUBCUTANEOUS | Status: DC
  Administered 2019-02-19 – 2019-02-21 (×4): 44 [IU] via SUBCUTANEOUS
  Filled 2019-02-19 (×5): qty 0.44

## 2019-02-19 NOTE — Progress Notes (Signed)
Inpatient Diabetes Program Recommendations  AACE/ADA: New Consensus Statement on Inpatient Glycemic Control (2015)  Target Ranges:  Prepandial:   less than 140 mg/dL      Peak postprandial:   less than 180 mg/dL (1-2 hours)      Critically ill patients:  140 - 180 mg/dL   Lab Results  Component Value Date   GLUCAP 285 (H) 02/19/2019   HGBA1C 7.5 (H) 02/14/2019    Review of Glycemic Control Results for LORALYN, SUSANA (MRN [id number]) as of 02/19/2019 14:30  Ref. Range 02/18/2019 15:56 02/18/2019 21:05 02/19/2019 07:32 02/19/2019 11:43  Glucose-Capillary Latest Ref Range: 70 - 99 mg/dL 284 (H) 266 (H) 238 (H) 285 (H)   Diabetes history: Type 2 DM Outpatient Diabetes medications: NPH 55 units BID, Novolog 40 units TID, Trulicity 1.5 mg Qwk Current orders for Inpatient glycemic control: Levemir 38 units BID, Novolog 8 units TID, Novolog 0-15 units TID, Novolog 0-5 units QHS  Inpatient Diabetes Program Recommendations:    Consider further increasing Levemir to 44 units BID and Novolog 12 units TID (assuming patient is consuming >50% of meal).   Thanks, Bronson Curb, MSN, RNC-OB Diabetes Coordinator [phone] (8a-5p)

## 2019-02-19 NOTE — Progress Notes (Addendum)
o2 removed for 5 minutes, Patient o2 satting at 88% on R/A while sitting up in chair. O2 applied patient satting at 95%. Will cont to monitor.

## 2019-02-19 NOTE — Plan of Care (Signed)

## 2019-02-19 NOTE — Progress Notes (Signed)
Physical Therapy Treatment Patient Details Name: Danielle [name] MRN: [id number] DOB: 05/03/1934 Today's Date: 02/19/2019    History of Present Illness Danielle [name] is an 84 y.o. female with multiple underlying chronic medical comorbidities including insulin-dependent diabetes, hypertension, chronic kidney disease, and coronary artery disease status post previous stenting, presented to the ER today after a mechanical fall 2 days prior to admission with subsequent inability to bear weight and ongoing complaints of right hip pain. Pt is now s/p R femur IM fixation (02/17/19)    PT Comments    Pt in bed with daughter present upon PT arrival. Pt daughter requests to translate for her mother through PT session due to pt's HOH. Pt continues to require significant assistance for bed mobility and transfers (maxA of 2 for bed mobility, helicopter maxA of 2 for sup-sit, and maxA of 2 to stand). Pt was able to be transferred to a recliner today with use of stedy and maxa of 2. Pt tolerated activity, despite appearing to be in some pain. Pt daughter still requesting home instead of SNF (what I would normally recommend based on pt's presentation), but expressed some concern with level of assist needed to complete transfer today during PT session. Recommend continued skilled PT while in hospital and attempt to progress to transfer with RW when safe to mimic transfers that pt will be doing with family at d/c.    Follow Up Recommendations  Home health PT;Supervision/Assistance - 24 hour(family denies SNF)     Equipment Recommendations  Rolling walker with 5" wheels;3in1 (PT);Wheelchair (measurements PT);Hospital bed;Wheelchair cushion (measurements PT)    Recommendations for Other Services       Precautions / Restrictions Precautions Precautions: Fall Restrictions Weight Bearing Restrictions: Yes RLE Weight Bearing: Weight bearing as tolerated    Mobility  Bed Mobility Overal bed mobility: Needs  Assistance Bed Mobility: Supine to Sit;Rolling Rolling: Max assist;+2 for physical assistance   Supine to sit: Total assist;+2 for physical assistance;HOB elevated     General bed mobility comments: Pt required maxA for rolling to complete peri-care following BM in bed. Pt reports to daughter that she was unaware she had had a BM. Helicopter technique with use of bed pad to get pt to EOB.  Pt resistant at times and pushing back at times.  Pt unable to attempt scooting.  Transfers Overall transfer level: Needs assistance   Transfers: Sit to/from Stand;Stand Pivot Transfers Sit to Stand: +2 physical assistance;Max assist Stand pivot transfers: Total assist;+2 physical assistance       General transfer comment: Pt able to stand in stedy with maxA and VCs of 2, then used total assist of stedy to pivot to recliner in room. lift pad left in recliner with pt for RN staff use  Ambulation/Gait             General Gait Details: unable   Stairs             Wheelchair Mobility    Modified Rankin (Stroke Patients Only)       Balance Overall balance assessment: History of Falls;Needs assistance Sitting-balance support: Feet supported Sitting balance-Leahy Scale: Poor Sitting balance - Comments: Pt initially with sig post lean at EOB, sig post lean (possibly slightly resistive) in recliner which complicates repositioning of pt in recliner. Postural control: Posterior lean Standing balance support: Bilateral upper extremity supported;During functional activity Standing balance-Leahy Scale: Zero Standing balance comment: pt in stedy, full support  Cognition Arousal/Alertness: Lethargic Behavior During Therapy: WFL for tasks assessed/performed;Flat affect Overall Cognitive Status: Difficult to assess                                 General Comments: Daughter states pt does not have dementia, pt does not interact with  therapy or daughter much through session, possibly due to impaired hearing but seems to be intermittently answering daughters questions.      Exercises      General Comments General comments (skin integrity, edema, etc.): Daughter is still reporting they prefer to take pt home, seemed concerned about pts ability to transfer at home after seeing the amount of assist and equipment needed for trasnfer to recliner today.      Pertinent Vitals/Pain Pain Assessment: Faces Faces Pain Scale: Hurts whole lot Pain Location: Grimacing and yelling with bed mobility Pain Descriptors / Indicators: Guarding;Grimacing;Operative site guarding Pain Intervention(s): Limited activity within patient's tolerance;Monitored during session;Repositioned    Home Living                      Prior Function            PT Goals (current goals can now be found in the care plan section) Acute Rehab PT Goals Patient Stated Goal: Pt to go home with family support PT Goal Formulation: With family Time For Goal Achievement: 03/04/19 Potential to Achieve Goals: Fair Additional Goals Additional Goal #1: Family will be knowledgeable in ways to assist pt with mobility and HEP. Progress towards PT goals: Progressing toward goals    Frequency    Min 5X/week      PT Plan Current plan remains appropriate    Co-evaluation              AM-PAC PT "6 Clicks" Mobility   Outcome Measure  Help needed turning from your back to your side while in a flat bed without using bedrails?: Total Help needed moving from lying on your back to sitting on the side of a flat bed without using bedrails?: Total Help needed moving to and from a bed to a chair (including a wheelchair)?: Total Help needed standing up from a chair using your arms (e.g., wheelchair or bedside chair)?: A Lot Help needed to walk in hospital room?: Total Help needed climbing 3-5 steps with a railing? : Total 6 Click Score: 7    End of  Session Equipment Utilized During Treatment: Gait belt;Oxygen;Other (comment)(stedy) Activity Tolerance: Patient limited by pain Patient left: in chair;with call bell/phone within reach;with chair alarm set;with family/visitor present Nurse Communication: Mobility status;Need for lift equipment PT Visit Diagnosis: Other abnormalities of gait and mobility (R26.89);History of falling (Z91.81)     Time: [id number] PT Time Calculation (min) (ACUTE ONLY): 40 min  Charges:  $Therapeutic Activity: 23-37 mins $Self Care/Home Management: 8-22                     Mickey Farber, PT, DPT   Acute Rehabilitation Department [phone]   Otho Bellows 02/19/2019, 11:49 AM

## 2019-02-19 NOTE — Progress Notes (Signed)
Admit: 02/14/2019 LOS: 75  84 year old Danielle [name] with advanced CKD, CAD, hyperlipidemia, admitted with hip fracture on the right side  Subjective:  . No interval events . SCr 3.5 this AM, K and HCO3 ok . 1.7L UOP  11/11 0701 - 11/12 0700 In: 440 [P.O.:240; IV Piggyback:200] Out: [id number] [Urine:1650]  Filed Weights   02/16/19 0000 02/18/19 0600  Weight: 85.8 kg 86.8 kg    Scheduled Meds: . atorvastatin  40 mg Oral QHS  . calcitRIOL  0.25 mcg Oral QODAY  . Chlorhexidine Gluconate Cloth  6 each Topical Daily  . docusate sodium  100 mg Oral BID  . heparin injection (subcutaneous)  5,000 Units Subcutaneous Q12H  . influenza vaccine adjuvanted  0.5 mL Intramuscular Tomorrow-1000  . insulin aspart  0-15 Units Subcutaneous TID WC  . insulin aspart  0-5 Units Subcutaneous QHS  . insulin aspart  12 Units Subcutaneous TID WC  . insulin detemir  44 Units Subcutaneous BID  . iron polysaccharides  150 mg Oral QODAY  . isosorbide mononitrate  30 mg Oral Q breakfast  . isosorbide mononitrate  60 mg Oral QPC lunch  . metoprolol tartrate  25 mg Oral BID  . mupirocin ointment  1 application Nasal BID  . pantoprazole  40 mg Oral Daily  . polyethylene glycol  17 g Oral Daily  . senna  2 tablet Oral Daily  . sodium bicarbonate  325 mg Oral Q48H  . ticagrelor  90 mg Oral BID  . torsemide  40 mg Oral Daily   Continuous Infusions:  PRN Meds:.acetaminophen, bisacodyl, HYDROmorphone (DILAUDID) injection, menthol-cetylpyridinium **OR** phenol, metoCLOPramide **OR** metoCLOPramide (REGLAN) injection, ondansetron **OR** ondansetron (ZOFRAN) IV, oxyCODONE  Current Labs: reviewed    Physical Exam:  Blood pressure (!) 86/40, pulse 82, temperature 97.8 F (36.6 C), temperature source Oral, resp. rate 17, height 4\' 8"  (1.422 m), weight 86.8 kg, SpO2 97 %. NAD, lying flat in bed, obese Regular, normal S1-S2 Clear breath sounds bilaterally Trace peripheral edema No rashes or lesions Awake alert,  nonfocal  A 1. Renal failure, appears this is all CKD, advanced stage IV.  Creatinine was 2.98 in August of this year.  Slight worsening post-op, CTM, 2. Status post right hip fracture following fall,surgical repair 11/10 3. History of CAD 4. chronic diastolic heart failure  5. DM2 6. History of stroke with carotid stenting in [place] 7. Anemia, mild, normocytic  P  . Cont torsemide 40 mg daily . Stop q48h NaHCO3, Serum HCO3 stable at target . Will need outpt f/u at [place], we will reach out to family . Daily weights, Daily Renal Panel, Strict I/Os, Avoid nephrotoxins (NSAIDs, judicious IV Contrast)   Pearson Grippe MD 02/19/2019, 4:47 PM  Recent Labs  Lab 02/17/19 0557 02/18/19 0418 02/19/19 0351  NA 141 141 139  K 4.7 4.2 4.2  CL 104 103 102  CO2 27 26 25   GLUCOSE 254* 240* 270*  BUN 74* Danielle Danielle [name]* 87*  CREATININE 3.21* 3.31* 3.48*  CALCIUM 9.0 8.0* 8.2*  PHOS 5.1* 5.3* 4.6   Recent Labs  Lab 02/17/19 0557 02/18/19 0418 02/18/19 1418 02/19/19 0351  WBC 10.9* 9.4  --  10.3  HGB 9.3* 7.9* 7.6* 7.4*  HCT 30.9* 25.7* 24.6* 23.6*  MCV 96.3 97.3  --  95.2  PLT 185 179  --  181

## 2019-02-19 NOTE — Progress Notes (Signed)
Patient transferred back to bed. Patient was incontinent of urine and pad was wet. Bladder scan was performed and revealed >600cc of urine. MD notified. Order received to Place a Foley catheter.

## 2019-02-19 NOTE — Consult Note (Signed)
Urology Consult Note   Requesting Attending Physician:  Rodena Goldmann, DO Service Providing Consult: Urology   Reason for Consult:  Difficult urethral catheterization  HPI: Danielle [name] is seen in consultation for reasons noted above at the request of Rodena Goldmann, DO for evaluation of difficult urethral catheterization.  This is a 84 y.o. female with history of CKD, CAD, HLD, hip fracture s/p IM nail 02/17/2019. Urology was consulted for difficult urethral catheterization in the setting of urinary retention.   Baseline creatinine at time of admission was 3.3 Over the past 3 days have averaged 1.6-2.3L/day.  Today, had decreased voided urine output, bladder scanned for ~600cc (02/19/2019 1500).  Primary team elected for I/O catheterization.  Unfortunately, inability to catheterize with reliable urine output. Thus urology was called.  History is obtained from the patient and daughter.  Denies any prior episodes of urinary retention.  Denies any prior episodes of hematuria.  Does report one remote stone episode several decades ago.  Has never seen a urologist.  Does report using up to 6 diapers per day but she does also go to the bathroom and void volitionally.  She does walk with a walker but is largely sedentary.  This sounds like mixed urinary incontinence.  Endorses not at her baseline since fracture and repair.   Past Medical History: Past Medical History:  Diagnosis Date   CAD S/P percutaneous coronary angioplasty November 2012   a)Severe 3 Vessel, s/p PCI with Resolute DES to LAD x1, LCx/OM x2, diffise distal LAD & entire RCA disease - not PCI amenable;; b) Myoview 12/2013: EF 67%,OINTERMEDIATE RISK - Inferior Ischemia c/w known anatomy (RCA CAD that is not PCI amenable)      Chronic diastolic heart failure, NYHA class 2-3    Grade 1 Diastolic Dysfunction by echo   CKD (chronic kidney disease), stage III    Diabetes mellitus, type II, insulin dependent ([place])     Difficult to  control; in obese, with CAD and PAD complications    History of Non-ST elevated myocardial infarction (non-STEMI) 03/06/2011   Presentation was acute diastolic heart failure with edema and pulmonary edema.  Symptom was dyspnea but not specifically anginal chest pressure.   History of seasonal allergies    History of stroke with residual effects Unsure   Old left-sided CVA --> S/P Left Carotid STENT in [place];    History of stroke without residual deficits August 2011   Right Internal Capsule Stroke, nonhemorrhagic   Hyperlipidemia    Hypertension    Obesity (BMI 30-39.9)    Osteoarthritis     Past Surgical History:  Past Surgical History:  Procedure Laterality Date   CAROTID STENT Left    Performed in [place]  03/16/11   LAD x1 (3.0 mm 20 mm (postdilated - 3.3 mm) Resolute DES, LCx/OM1 x2; distal 2.5 mm x 14 mm and proximal 3.0 mm at 26 mm Resolute DES; post-dilation 2.9 distal, 3.25 proximal   DOPPLER ECHOCARDIOGRAPHY  03/07/2011   EF 55-60%; moderate concentric LVH, mild posterolateral hypokinesis.  Grade 1 Diastolic Dysfunction with high filling pressures.   INTRAMEDULLARY (IM) NAIL INTERTROCHANTERIC Right 02/17/2019   Procedure: INTRAMEDULLARY (IM) NAIL RIGHT INTERTROCHANTERIC;  Surgeon: Rod Can, MD;  Location: [place] [facility];  Service: Orthopedics;  Laterality: Right;   INTRAVASCULAR ULTRASOUND  03/12/2011   Procedure: INTRAVASCULAR ULTRASOUND;  Surgeon: Leonie Man, MD;  Location[facility] C[facility] LAB;  Service: Cardiovascular;;  LEFT HEART CATHETERIZATION WITH CORONARY ANGIOGRAM N/A 03/12/2011   Procedure: LEFT HEART CATHETERIZATION WITH CORONARY ANGIOGRAM;  Surgeon: Leonie Man, MD;  Location: [facility] CATH LAB;  Service: Cardiovascular;  Laterality: N/A;   NM MYOVIEW LTD  September 2015   EF 67%,OINTERMEDIATE RISK - Inferior Ischemia c/w known anatomy (RCA CAD that is not PCI amenable)      PERCUTANEOUS  CORONARY STENT INTERVENTION (PCI-S) N/A 03/16/2011   Procedure: PERCUTANEOUS CORONARY STENT INTERVENTION (PCI-S);  Surgeon: Troy Sine, MD;  Location: [facility] CATH LAB;  Service: Cardiovascular;  Laterality: N/A;    Medication: Current Facility-Administered Medications  Medication Dose Route Frequency Provider Last Rate Last Dose   acetaminophen (TYLENOL) tablet 1,000 mg  1,000 mg Oral Q6H PRN Rod Can, MD   1,000 mg at 02/18/19 2336   atorvastatin (LIPITOR) tablet 40 mg  40 mg Oral QHS Swinteck, Aaron Edelman, MD   40 mg at 02/18/19 2315   bisacodyl (DULCOLAX) suppository 10 mg  10 mg Rectal Daily PRN Rod Can, MD       calcitRIOL (ROCALTROL) capsule 0.25 mcg  0.25 mcg Oral Leola Brazil, MD   0.25 mcg at 02/18/19 1025   Chlorhexidine Gluconate Cloth 2 % PADS 6 each  6 each Topical Daily Modena Jansky, MD   6 each at 02/19/19 0909   docusate sodium (COLACE) capsule 100 mg  100 mg Oral BID Rod Can, MD   100 mg at 02/18/19 2315   heparin injection 5,000 Units  5,000 Units Subcutaneous Q12H Rod Can, MD   5,000 Units at 02/19/19 0908   HYDROmorphone (DILAUDID) injection 0.5 mg  0.5 mg Intravenous Q8H PRN Rod Can, MD   0.5 mg at 02/16/19 0533   influenza vaccine adjuvanted (FLUAD) injection 0.5 mL  0.5 mL Intramuscular Tomorrow-1000 Hongalgi, Lenis Dickinson, MD       insulin aspart (novoLOG) injection 0-15 Units  0-15 Units Subcutaneous TID WC Rod Can, MD   8 Units at 02/19/19 1825   insulin aspart (novoLOG) injection 0-5 Units  0-5 Units Subcutaneous QHS Rod Can, MD   3 Units at 02/18/19 2316   insulin aspart (novoLOG) injection 12 Units  12 Units Subcutaneous TID WC Shah, Pratik D, DO   12 Units at 02/19/19 1824   insulin detemir (LEVEMIR) injection 44 Units  44 Units Subcutaneous BID Manuella Ghazi, Pratik D, DO       iron polysaccharides (NIFEREX) capsule 150 mg  150 mg Oral QODAY Shah, Pratik D, DO   150 mg at 02/19/19 1245   isosorbide  mononitrate (IMDUR) 24 hr tablet 30 mg  30 mg Oral Q breakfast Swinteck, Aaron Edelman, MD   30 mg at 02/19/19 [id number]   isosorbide mononitrate (IMDUR) 24 hr tablet 60 mg  60 mg Oral QPC lunch Swinteck, Aaron Edelman, MD   60 mg at 02/19/19 1245   menthol-cetylpyridinium (CEPACOL) lozenge 3 mg  1 lozenge Oral PRN Swinteck, Aaron Edelman, MD       Or   phenol (CHLORASEPTIC) mouth spray 1 spray  1 spray Mouth/Throat PRN Swinteck, Aaron Edelman, MD       metoCLOPramide (REGLAN) tablet 5-10 mg  5-10 mg Oral Q8H PRN Swinteck, Aaron Edelman, MD       Or   metoCLOPramide (REGLAN) injection 5-10 mg  5-10 mg Intravenous Q8H PRN Swinteck, Aaron Edelman, MD       metoprolol tartrate (LOPRESSOR) tablet 25 mg  25 mg Oral BID Rod Can, MD   25 mg at 02/19/19 0907   mupirocin ointment (BACTROBAN)  2 % 1 application  1 application Nasal BID Rod Can, MD   1 application at [id number]   ondansetron (ZOFRAN) tablet 4 mg  4 mg Oral Q6H PRN Swinteck, Aaron Edelman, MD       Or   ondansetron [facility]) injection 4 mg  4 mg Intravenous Q6H PRN Swinteck, Aaron Edelman, MD       oxyCODONE (Oxy IR/ROXICODONE) immediate release tablet 5 mg  5 mg Oral Q4H PRN Rod Can, MD   5 mg at 02/19/19 0907   pantoprazole (PROTONIX) EC tablet 40 mg  40 mg Oral Daily Rod Can, MD   40 mg at 02/19/19 [id number]   polyethylene glycol (MIRALAX / GLYCOLAX) packet 17 g  17 g Oral Daily Rod Can, MD   17 g at 02/16/19 1229   senna (SENOKOT) tablet 17.2 mg  2 tablet Oral Daily Swinteck, Aaron Edelman, MD   17.2 mg at 02/16/19 1229   ticagrelor (BRILINTA) tablet 90 mg  90 mg Oral BID Heath Lark D, DO   90 mg at 02/19/19 1245   torsemide (DEMADEX) tablet 40 mg  40 mg Oral Daily Rexene Agent, MD   40 mg at 02/19/19 [id number]    Allergies: Allergies  Allergen Reactions   Lisinopril Cough    Social History: Social History   Tobacco Use   Smoking status: Never Smoker   Smokeless tobacco: Never Used  Substance Use Topics   Alcohol use: No   Drug use: No     Family History Family History  Family history unknown: Yes    Review of Systems 10 systems were reviewed and are negative except as noted specifically in the HPI.  Objective   Vital signs in last 24 hours: BP (!) 86/40 (BP Location: Left Arm)    Pulse 82    Temp 97.8 F (36.6 C) (Oral)    Resp 17    Ht 4\' 8"  (1.422 m)    Wt 86.8 kg    SpO2 97%    BMI 42.90 kg/m   Physical Exam General: NAD, resting, appropriate Pulmonary: Normal work of breathing Cardiovascular: HDS, adequate peripheral perfusion Abdomen: Soft, NTTP, nondistended, obese. GU: Mild vaginal atrophy, urethral meatus is retracted/retropubic making visualization difficult.  Clitoris with superficial irritation.  Foley catheter was placed with occlusion of vaginal vault. Extremities: warm and well perfused.  Right lower extremity immobilized  Most Recent Labs: Lab Results  Component Value Date   WBC 10.3 02/19/2019   HGB 7.4 (L) 02/19/2019   HCT 23.6 (L) 02/19/2019   PLT 181 02/19/2019    Lab Results  Component Value Date   NA 139 02/19/2019   K 4.2 02/19/2019   CL 102 02/19/2019   CO2 25 02/19/2019   BUN 87 (H) 02/19/2019   CREATININE 3.48 (H) 02/19/2019   CALCIUM 8.2 (L) 02/19/2019   MG 2.1 03/11/2011   PHOS 4.6 02/19/2019    Lab Results  Component Value Date   INR 1.2 02/14/2019   APTT 36 03/06/2011    ------  Assessment:  84 y.o. female with PMHx of CKD, BM, CAD s/p stent, stroke, HTN, HLD, obesity, and hip fracture s/p IM nail on 02/17/2019, now with urinary retention.   Creatinine on admission ~3.3, known CKD RUS 02/15/2019 without hydronephrosis, unremarkable bladder.   Post operative urinary retention is often multifactorial including suboptimal ambulation, narcotics, polypharmacy, and/or constipation. Given catheterized for 1000cc of urine, likely has bladder stretch injury. Plan for bladder rest for 7 days, may then attempt TOV.  Urethral was noted to be retracted, if trial of void  fails and patient requires new catheter, RN to place with coud catheter occluding vaginal canal.   Recommendations: - Continue Foley catheter for at least 5-7 days given urinary retention with bladder stretch injury.   -Trial of void may take place as an inpatient with formal bladder scans after.  Patient will discharge prior to trial of void, please contact urology to ensure follow-up in clinic. -If patient describes Foley catheter discomfort, can consider Flomax 0.5 mg and/or Myrbetriq 25 mg.   Thank you for this consult. Please contact the urology consult pager with any further questions/concerns.

## 2019-02-19 NOTE — Progress Notes (Addendum)
PROGRESS NOTE    Danielle [name]  [id number] DOB: Mar 28, 1935 DOA: 02/14/2019 PCP: Jani Gravel, MD   Brief Narrative:  84 year old [name], lives with daughter, ambulates with help of walker, mostly sedentary, requires assistance for most ADLs, hard of hearing and refuses to wear hearing aid, PMH of NSTEMI 2012 when she presented with acute pulmonary edema, severe three-vessel CAD, s/p PCI, chronic diastolic CHF, stage III chronic kidney disease (unknown baseline), type II DM/IDDM on high doses of insulins, CVA, s/p left carotid stent, HLD, HTN, obesity, sustained mechanical fall at home 2 days PTA and presented to [facility] ED on 11/7 due to left hip pain and unable to weight-bear. Admitted for acute right intertrochanteric hip fracture, orthopedics consulted and plan surgery possibly 02/16/2019. Course complicated by acute on chronic kidney disease and anasarca, nephrology consulted. Cardiology consulted for preop clearance. S/p right hip fracture surgery 11/10.  11/11: Pt transitioned to oral torsemide by Nephrology today with first dose to be given on 11/12. PT evaluation with SNF recommendation, but family wants [facility].  11/12: Patient has mildly elevated creatinine levels noted this morning and has been assessed by nephrology.  Continue torsemide as prescribed with good urine output.  She has not ambulated much with physical therapy as of yet and family members would like to wait until this has happened prior to discharge.  She may need home oxygen and has some mild urinary retention noted on bladder scan.  Assessment & Plan:   Active Problems:   Hip fracture (HCC)   Acute right trochanteric hip fracture  Sustained status post mechanical fall.  Patient was supposed to be for surgery 11/9. However shewasnot medically stable for surgery and hence surgery was canceled.   After cardiology and nephrology clearance, determined to be intermediate risk, underwent right femur  intramedullary fixation on 11/10.  Management per orthopedics.  Patient reported right shoulder pain on 11/8 and x-ray of right shoulder without acute findings.  Acute on stage4 chronic kidney disease/anasarca  As per nephrology, creatinine 2.98 in August 2020 and hence suspect advanced stage IV CKD.  Hholding Lasix and ARB.  Nephrologyfollow-up appreciated. Renal ultrasound unremarkable.   Etiology unclear. Could be related to diabetic nephropathy and? Nephrotic range proteinuria, CKD, CHF and hypoalbuminemia.  Follow daily BMP. Also on sodium bicarbonate.  Oral torsemide starting 11/12  Outpatient follow-up with nephrology, [facility] which will be arranged.  Acute on chronic diastolic CHF  Lacks respiratory symptoms but significant third spacing/volume overloadon admission.  TTE 02/16/2019: LVEF 60-65%, severe LVH moderate MS no aortic stenosis.  Diuretic management as indicated above. ARB on hold due to acute kidney injury.  Cardiology seen this admission.  On IV diuretics per nephrology. Improving.  -1.2 L fluid balance noted today.  Acute respiratory failure with hypoxia  Secondary to decompensated CHF, and OHS.  Oxygen saturations reportedly in the 70s when patient dislodged her nasal cannula oxygen this morning.  Saturating in the low 90s on 2 L/minyesterday.  Will need to reassess oxygen requirement prior to discharge.  CAD, s/p PCI (CFX and LAD) in 2012 in the setting of NSTEMI & acute diastolic CHF  No anginal symptoms and patient quite sedentary at home.  Noncompliant with diet.  Is on Brilinta without aspirin. Continue atorvastatin, Imdur and metoprolol.  Brilinta on hold since 2 days prior to admission,was on hold for surgery. Resume Brilinta today as hemoglobin has remained stable.  Poorly controlled type II DM with renal complications, obesity and hyperlipidemia.  Patient on high  doses of N and R insulin at home, noncompliant with  diet, does not check CBGs regularly.  Follows with Dr. Chalmers Cater, Endocrinology  A1c 7.5.  Adjusted insulins this admission. Currently on Levemir 35 units twice daily, NovoLog moderate sensitivity SSI and mealtime NovoLog6units 3 times daily.Better controlled than before, still mildly uncontrolled.  DM coordinator input appreciated.   Continue to monitor on current regimen overnight and consider increasing Levemir to 40 units twice if persistently elevated.  Essential hypertension  Reasonably controlled on Imdur twice daily and metoprolol 25 mg twice daily.  Added as needed IV hydralazine.  Hyperlipidemia  Continue atorvastatin.  PAD  History of left carotid stent remotely in [place].  Anemia in CKD-stable  Hemoglobinstable/7.4  Transfuse for hemoglobin less than 7  She is noted to have iron deficiency for which Feraheme was given 11/11  Resume home iron supplementation  Okay to resume Brilinta with no overt bleeding identified  Morbid obesity/Body mass index is 42.41 kg/m.  Hard of hearing  Chest x-ray abnormality  Chest x-ray 11/7: Abnormal appearance of left hilum and left lung base of uncertain significance possibly related to chronic scarring and/or airspace disease. Recommended PA and lateral x-rays, perform when able.  Follow-up chest x-ray two-view 11/8: Suggestive of pulmonary edema.  Asymptomatic bacteriuria   DVT prophylaxis:Heparin Code Status: Full Family Communication: Daughter at bedside Disposition Plan:  Anticipate discharge with home health physical therapy once equipment delivered at home.  Needs further ambulation with physical therapy today and possible Foley catheter insertion prior to discharge as she is having some urinary retention.  Evaluate for home oxygen needs.  Hopefully can be discharged by a.m. once everything settled.   Consultants:   Orthopedics  Cardiology  Nephrology  Procedures:   IM fixation R  femur 11/10  Antimicrobials:  Anti-infectives (From admission, onward)   Start     Dose/Rate Route Frequency Ordered Stop   02/17/19 1530  ceFAZolin (ANCEF) IVPB 2g/100 mL premix     2 g 200 mL/hr over 30 Minutes Intravenous Every 24 hours 02/17/19 1519 02/18/19 1617   02/16/19 0600  ceFAZolin (ANCEF) IVPB 2g/100 mL premix  Status:  Discontinued     2 g 200 mL/hr over 30 Minutes Intravenous On call to O.R. 02/15/19 2006 02/17/19 0559       Subjective: Patient seen and evaluated today with no new acute complaints or concerns. No acute concerns or events noted overnight.  Lying comfortably in bed.  Objective: Vitals:   02/18/19 1931 02/18/19 2300 02/19/19 0331 02/19/19 0751  BP: (!) 109/57 (!) 106/43 (!) 105/45 (!) 120/55  Pulse: 90 86 88 85  Resp: 18   16  Temp: 99.1 F (37.3 C)  98.3 F (36.8 C) 97.8 F (36.6 C)  TempSrc: Oral  Oral Oral  SpO2: 93% 92% 92% 97%  Weight:      Height:        Intake/Output Summary (Last 24 hours) at 02/19/2019 1006 Last data filed at 02/19/2019 0536 Gross per 24 hour  Intake 440 ml  Output 1650 ml  Net -1210 ml   Filed Weights   02/16/19 0000 02/18/19 0600  Weight: 85.8 kg 86.8 kg    Examination:  General exam: Appears calm and comfortable  Respiratory system: Clear to auscultation. Respiratory effort normal.  Currently on 2 L nasal cannula oxygen. Cardiovascular system: S1 & S2 heard, RRR. No JVD, murmurs, rubs, gallops or clicks. No pedal edema. Gastrointestinal system: Abdomen is nondistended, soft and nontender. No organomegaly or  masses felt. Normal bowel sounds heard. Central nervous system: Alert and oriented. No focal neurological deficits. Extremities: Symmetric 5 x 5 power. Skin: No rashes, lesions or ulcers Psychiatry: Judgement and insight appear normal. Mood & affect appropriate.     Data Reviewed: I have personally reviewed following labs and imaging studies  CBC: Recent Labs  Lab 02/14/19 1436 02/16/19 0223  02/17/19 0557 02/18/19 0418 02/18/19 1418 02/19/19 0351  WBC 11.2* 9.7 10.9* 9.4  --  10.3  HGB 9.8* 8.9* 9.3* 7.9* 7.6* 7.4*  HCT 31.3* 29.5* 30.9* 25.7* 24.6* 23.6*  MCV 96.0 98.0 96.3 97.3  --  95.2  PLT 154 163 185 179  --  0000000   Basic Metabolic Panel: Recent Labs  Lab 02/15/19 0514 02/16/19 0223 02/17/19 0557 02/18/19 0418 02/19/19 0351  NA 143 140 141 141 139  K 4.9 4.7 4.7 4.2 4.2  CL 110 107 104 103 102  CO2 23 23 27 26 25   GLUCOSE 289* 361* 254* 240* 270*  BUN 62* 62* 74* 80* 87*  CREATININE 3.12* 3.05* 3.21* 3.31* 3.48*  CALCIUM 8.6* 8.6* 9.0 8.0* 8.2*  PHOS  --   --  5.1* 5.3* 4.6   GFR: Estimated Creatinine Clearance: 11.1 mL/min (A) (by C-G formula based on SCr of 3.48 mg/dL (H)). Liver Function Tests: Recent Labs  Lab 02/15/19 0514 02/17/19 0557 02/18/19 0418 02/19/19 0351  AST 17  --   --   --   ALT 11  --   --   --   ALKPHOS 169*  --   --   --   BILITOT 0.5  --   --   --   PROT 7.6  --   --   --   ALBUMIN 3.0* 2.9* 2.6* 2.4*   No results for input(s): LIPASE, AMYLASE in the last 168 hours. No results for input(s): AMMONIA in the last 168 hours. Coagulation Profile: Recent Labs  Lab 02/14/19 1658  INR 1.2   Cardiac Enzymes: No results for input(s): CKTOTAL, CKMB, CKMBINDEX, TROPONINI in the last 168 hours. BNP (last 3 results) No results for input(s): PROBNP in the last 8760 hours. HbA1C: No results for input(s): HGBA1C in the last 72 hours. CBG: Recent Labs  Lab 02/18/19 0840 02/18/19 1143 02/18/19 1556 02/18/19 2105 02/19/19 0732  GLUCAP 200* 299* 284* 266* 238*   Lipid Profile: No results for input(s): CHOL, HDL, LDLCALC, TRIG, CHOLHDL, LDLDIRECT in the last 72 hours. Thyroid Function Tests: No results for input(s): TSH, T4TOTAL, FREET4, T3FREE, THYROIDAB in the last 72 hours. Anemia Panel: Recent Labs    02/18/19 0418  TIBC 186*  IRON 19*   Sepsis Labs: No results for input(s): PROCALCITON, LATICACIDVEN in the last  168 hours.  Recent Results (from the past 240 hour(s))  SARS CORONAVIRUS 2 (TAT 6-24 HRS) Nasopharyngeal Nasopharyngeal Swab     Status: None   Collection Time: 02/14/19  6:16 PM   Specimen: Nasopharyngeal Swab  Result Value Ref Range Status   SARS Coronavirus 2 NEGATIVE NEGATIVE Final    Comment: (NOTE) SARS-CoV-2 target nucleic acids are NOT DETECTED. The SARS-CoV-2 RNA is generally detectable in upper and lower respiratory specimens during the acute phase of infection. Negative results do not preclude SARS-CoV-2 infection, do not rule out co-infections with other pathogens, and should not be used as the sole basis for treatment or other patient management decisions. Negative results must be combined with clinical observations, patient history, and epidemiological information. The expected result is Negative. Fact  Sheet for Patients: SugarRoll.be Fact Sheet for Healthcare Providers: https://www.woods-mathews.com/ This test is not yet approved or cleared by the [place] FDA and  has been authorized for detection and/or diagnosis of SARS-CoV-2 by FDA under an Emergency Use Authorization (EUA). This EUA will remain  in effect (meaning this test can be used) for the duration of the COVID-19 declaration under Section 56 4(b)(1) of the Act, 21 U.S.C. section 360bbb-3(b)(1), unless the authorization is terminated or revoked sooner. Performed at [facility], [place] [address] [place], [place] [address]   Surgical pcr screen     Status: Abnormal   Collection Time: 02/14/19  9:01 PM   Specimen: Nasal Mucosa; Nasal Swab  Result Value Ref Range Status   MRSA, PCR NEGATIVE NEGATIVE Final   Staphylococcus aureus POSITIVE (A) NEGATIVE Final    Comment: (NOTE) The Xpert SA Assay (FDA approved for NASAL specimens in patients 32 years of age and older), is one component of a comprehensive surveillance program. It is not intended to diagnose  infection nor to guide or monitor treatment. Performed at [facility], [place] [address] [place], [place] [address]          Radiology Studies: Pelvis Portable  Result Date: 02/17/2019 CLINICAL DATA:  ORIF right femur. EXAM: PORTABLE PELVIS 1-2 VIEWS COMPARISON:  Prior right hip study same day. FINDINGS: ORIF right hip. Hardware intact. Anatomic alignment one-view. Degenerative changes left hip. Aortoiliac and peripheral vascular calcification. IMPRESSION: ORIF right hip with anatomic alignment on one view. Electronically Signed   By: Marcello Moores  Register   On: 02/17/2019 15:09   Dg C-arm 1-60 Min  Result Date: 02/17/2019 CLINICAL DATA:  ORIF. EXAM: RIGHT FEMUR 2 VIEWS; DG C-ARM 1-60 MIN COMPARISON:  02/14/2019. FINDINGS: ORIF right femur.  Hardware intact.  Anatomic alignment. IMPRESSION: ORIF right femur.  Anatomic alignment. Electronically Signed   By: Marcello Moores  Register   On: 02/17/2019 14:02   Dg Femur, Min 2 Views Right  Result Date: 02/17/2019 CLINICAL DATA:  ORIF. EXAM: RIGHT FEMUR 2 VIEWS; DG C-ARM 1-60 MIN COMPARISON:  02/14/2019. FINDINGS: ORIF right femur.  Hardware intact.  Anatomic alignment. IMPRESSION: ORIF right femur.  Anatomic alignment. Electronically Signed   By: Marcello Moores  Register   On: 02/17/2019 14:02   Dg Femur Port, Min 2 Views Right  Result Date: 02/17/2019 CLINICAL DATA:  84 year old female with right intertrochanteric fracture status post ORIF. EXAM: RIGHT FEMUR PORTABLE 2 VIEW COMPARISON:  Right hip radiograph dated 02/14/2019 FINDINGS: Status post ORIF of the right femoral fracture with an intramedullary rod and transcervical screw. The major fracture fragment appear in anatomic alignment. The orthopedic hardware appears intact. There is a mildly displaced fracture fragment of the lesser trochanter. There is no dislocation. There is moderate arthritic changes of the right hip. Moderate to severe arthritic changes of the noted. Probable small suprapatellar  effusion. Vascular calcifications. The soft tissues are grossly unremarkable. IMPRESSION: Status post ORIF of the right femoral fracture. No needed complication. Electronically Signed   By: Anner Crete M.D.   On: 02/17/2019 15:15        Scheduled Meds:  atorvastatin  40 mg Oral QHS   calcitRIOL  0.25 mcg Oral QODAY   Chlorhexidine Gluconate Cloth  6 each Topical Daily   docusate sodium  100 mg Oral BID   heparin injection (subcutaneous)  5,000 Units Subcutaneous Q12H   influenza vaccine adjuvanted  0.5 mL Intramuscular Tomorrow-1000   insulin aspart  0-15 Units Subcutaneous TID WC  insulin aspart  0-5 Units Subcutaneous QHS   insulin aspart  8 Units Subcutaneous TID WC   insulin detemir  38 Units Subcutaneous BID   isosorbide mononitrate  30 mg Oral Q breakfast   isosorbide mononitrate  60 mg Oral QPC lunch   metoprolol tartrate  25 mg Oral BID   mupirocin ointment  1 application Nasal BID   pantoprazole  40 mg Oral Daily   polyethylene glycol  17 g Oral Daily   senna  2 tablet Oral Daily   sodium bicarbonate  325 mg Oral Q48H   torsemide  40 mg Oral Daily   Continuous Infusions:   LOS: 5 days    Time spent: 30 minutes    Ellanie Oppedisano Darleen Crocker, DO [facility] Pager [phone]  If 7PM-7AM, please contact night-coverage [email or web address] Password [email or web address] 02/19/2019, 10:06 AM

## 2019-02-20 LAB — CBC
HCT: 22.8 % — ABNORMAL LOW (ref 36.0–46.0)
Hemoglobin: 7.1 g/dL — ABNORMAL LOW (ref 12.0–15.0)
MCH: 29.1 pg (ref 26.0–34.0)
MCHC: 31.1 g/dL (ref 30.0–36.0)
MCV: 93.4 fL (ref 80.0–100.0)
Platelets: 194 10*3/uL (ref 150–400)
RBC: 2.44 MIL/uL — ABNORMAL LOW (ref 3.87–5.11)
RDW: 13.5 % (ref 11.5–15.5)
WBC: 12.8 10*3/uL — ABNORMAL HIGH (ref 4.0–10.5)
nRBC: 0.2 % (ref 0.0–0.2)

## 2019-02-20 LAB — BASIC METABOLIC PANEL
Anion gap: 14 (ref 5–15)
BUN: 93 mg/dL — ABNORMAL HIGH (ref 8–23)
CO2: 24 mmol/L (ref 22–32)
Calcium: 8.2 mg/dL — ABNORMAL LOW (ref 8.9–10.3)
Chloride: 99 mmol/L (ref 98–111)
Creatinine, Ser: 3.28 mg/dL — ABNORMAL HIGH (ref 0.44–1.00)
GFR calc Af Amer: 14 mL/min — ABNORMAL LOW (ref >60)
GFR calc non Af Amer: 12 mL/min — ABNORMAL LOW (ref >60)
Glucose, Bld: 209 mg/dL — ABNORMAL HIGH (ref 70–99)
Potassium: 3.9 mmol/L (ref 3.5–5.1)
Sodium: 137 mmol/L (ref 135–145)

## 2019-02-20 LAB — GLUCOSE, CAPILLARY
Glucose-Capillary: 188 mg/dL — ABNORMAL HIGH (ref 70–99)
Glucose-Capillary: 211 mg/dL — ABNORMAL HIGH (ref 70–99)
Glucose-Capillary: 161 mg/dL — ABNORMAL HIGH (ref 70–99)
Glucose-Capillary: 163 mg/dL — ABNORMAL HIGH (ref 70–99)

## 2019-02-20 LAB — HEMOGLOBIN AND HEMATOCRIT, BLOOD
HCT: 28.1 % — ABNORMAL LOW (ref 36.0–46.0)
Hemoglobin: 9.1 g/dL — ABNORMAL LOW (ref 12.0–15.0)

## 2019-02-20 LAB — PREPARE RBC (CROSSMATCH)

## 2019-02-20 MED ORDER — SODIUM CHLORIDE 0.9% IV SOLUTION
Freq: Once | INTRAVENOUS | Status: AC
  Administered 2019-02-20: 10:00:00 via INTRAVENOUS

## 2019-02-20 NOTE — Progress Notes (Signed)
PT Cancellation Note  Patient Details Name: Danielle [name] MRN: [id number] DOB: 05-15-1934   Cancelled Treatment:    Reason Eval/Treat Not Completed: Medical issues which prohibited therapy. Per RN Iona Beard, pt is currently receiving blood and PT should hold until later this afternoon to mobilize Pt. PT will return as time/schedule allow.   Mickey Farber, PT, DPT   Acute Rehabilitation Department [phone]   Otho Bellows 02/20/2019, 12:05 PM

## 2019-02-20 NOTE — Discharge Instructions (Signed)
Dr. Rod Can Adult Hip & Knee Specialist [facility] [address], [place], [place] [address] [phone]   POSTOPERATIVE DIRECTIONS    Hip Rehabilitation, Guidelines Following Surgery   WEIGHT BEARING Weight bearing as tolerated with assist device (walker, cane, etc) as directed, use it as long as suggested by your surgeon or therapist, typically at least 4-6 weeks.   HOME CARE INSTRUCTIONS  Remove items at home which could result in a fall. This includes throw rugs or furniture in walking pathways.  Continue medications as instructed at time of discharge.  You may have some home medications which will be placed on hold until you complete the course of blood thinner medication.  4 days after discharge, you may start showering. No tub baths or soaking your incisions. Do not put on socks or shoes without following the instructions of your caregivers.   Sit on chairs with arms. Use the chair arms to help push yourself up when arising.  Arrange for the use of a toilet seat elevator so you are not sitting low.   Walk with walker as instructed.  You may resume a sexual relationship in one month or when given the OK by your caregiver.  Use walker as long as suggested by your caregivers.  Avoid periods of inactivity such as sitting longer than an hour when not asleep. This helps prevent blood clots.  You may return to work once you are cleared by [employer], production.  Do not drive a car for 6 weeks or until released by your surgeon.  Do not drive while taking narcotics.  Wear elastic stockings for two weeks following surgery during the day but you may remove then at night.  Make sure you keep all of your appointments after your operation with all of your doctors and caregivers. You should call the office at the above phone number and make an appointment for approximately two weeks after the date of your surgery. Please pick up a stool softener and laxative  for home use as long as you are requiring pain medications.  ICE to the affected hip every three hours for 30 minutes at a time and then as needed for pain and swelling. Continue to use ice on the hip for pain and swelling from surgery. You may notice swelling that will progress down to the foot and ankle.  This is normal after surgery.  Elevate the leg when you are not up walking on it.   It is important for you to complete the blood thinner medication as prescribed by your doctor.  Continue to use the breathing machine which will help keep your temperature down.  It is common for your temperature to cycle up and down following surgery, especially at night when you are not up moving around and exerting yourself.  The breathing machine keeps your lungs expanded and your temperature down.  RANGE OF MOTION AND STRENGTHENING EXERCISES  These exercises are designed to help you keep full movement of your hip joint. Follow your caregiver's or physical therapist's instructions. Perform all exercises about fifteen times, three times per day or as directed. Exercise both hips, even if you have had only one joint replacement. These exercises can be done on a training (exercise) mat, on the floor, on a table or on a bed. Use whatever works the best and is most comfortable for you. Use music or television while you are exercising so that the exercises are a pleasant break in your day. This  will make your life better with the exercises acting as a break in routine you can look forward to.  Lying on your back, slowly slide your foot toward your buttocks, raising your knee up off the floor. Then slowly slide your foot back down until your leg is straight again.  Lying on your back spread your legs as far apart as you can without causing discomfort.  Lying on your side, raise your upper leg and foot straight up from the floor as far as is comfortable. Slowly lower the leg and repeat.  Lying on your back, tighten up the  muscle in the front of your thigh (quadriceps muscles). You can do this by keeping your leg straight and trying to raise your heel off the floor. This helps strengthen the largest muscle supporting your knee.  Lying on your back, tighten up the muscles of your buttocks both with the legs straight and with the knee bent at a comfortable angle while keeping your heel on the floor.   SKILLED REHAB INSTRUCTIONS: If the patient is transferred to a skilled rehab facility following release from the hospital, a list of the current medications will be sent to the facility for the patient to continue.  When discharged from the skilled rehab facility, please have the facility set up the patient's [place] prior to being released. Also, the skilled facility will be responsible for providing the patient with their medications at time of release from the facility to include their pain medication and their blood thinner medication. If the patient is still at the rehab facility at time of the two week follow up appointment, the skilled rehab facility will also need to assist the patient in arranging follow up appointment in our office and any transportation needs.  MAKE SURE YOU:  Understand these instructions.  Will watch your condition.  Will get help right away if you are not doing well or get worse.  Pick up stool softner and laxative for home use following surgery while on pain medications. Daily dry dressing changes as needed. In 4 days, you may remove your dressings and begin taking showers - no tub baths or soaking the incisions. Continue to use ice for pain and swelling after surgery. Do not use any lotions or creams on the incision until instructed by your surgeon.   Indwelling Urinary Catheter Care, Adult An indwelling urinary catheter is a thin tube that is put into your bladder. The tube helps to drain pee (urine) out of your body. The tube goes in through your urethra. Your  urethra is where pee comes out of your body. Your pee will come out through the catheter, then it will go into a bag (drainage bag). Take good care of your catheter so it will work well. How to wear your catheter and bag Supplies needed  Sticky tape (adhesive tape) or a leg strap.  Alcohol wipe or soap and water (if you use tape).  A clean towel (if you use tape).  Large overnight bag.  Smaller bag (leg bag). Wearing your catheter Attach your catheter to your leg with tape or a leg strap.  Make sure the catheter is not pulled tight.  If a leg strap gets wet, take it off and put on a dry strap.  If you use tape to hold the bag on your leg: 1. Use an alcohol wipe or soap and water to wash your skin where the tape made it sticky before. 2. Use a  clean towel to pat-dry that skin. 3. Use new tape to make the bag stay on your leg. Wearing your bags You should have been given a large overnight bag.  You may wear the overnight bag in the day or night.  Always have the overnight bag lower than your bladder.  Do not let the bag touch the floor.  Before you go to sleep, put a clean plastic bag in a wastebasket. Then hang the overnight bag inside the wastebasket. You should also have a smaller leg bag that fits under your clothes.  Always wear the leg bag below your knee.  Do not wear your leg bag at night. How to care for your skin and catheter Supplies needed  A clean washcloth.  Water and mild soap.  A clean towel. Caring for your skin and catheter      Clean the skin around your catheter every day: ? Wash your hands with soap and water. ? Wet a clean washcloth in warm water and mild soap. ? Clean the skin around your urethra. ? If you are female: ? Gently spread the folds of skin around your vagina (labia). ? With the washcloth in your other hand, wipe the inner side of your labia on each side. Wipe from front to back. ? If you are female: ? Pull back any skin that  covers the end of your penis (foreskin). ? With the washcloth in your other hand, wipe your penis in small circles. Start wiping at the tip of your penis, then move away from the catheter. ? Move the foreskin back in place, if needed. ? With your free hand, hold the catheter close to where it goes into your body. ? Keep holding the catheter during cleaning so it does not get pulled out. ? With the washcloth in your other hand, clean the catheter. ? Only wipe downward on the catheter. ? Do not wipe upward toward your body. Doing this may push germs into your urethra and cause infection. ? Use a clean towel to pat-dry the catheter and the skin around it. Make sure to wipe off all soap. ? Wash your hands with soap and water.  Shower every day. Do not take baths.  Do not use cream, ointment, or lotion on the area where the catheter goes into your body, unless your doctor tells you to.  Do not use powders, sprays, or lotions on your genital area.  Check your skin around the catheter every day for signs of infection. Check for: ? Redness, swelling, or pain. ? Fluid or blood. ? Warmth. ? Pus or a bad smell. How to empty the bag Supplies needed  Rubbing alcohol.  Gauze pad or cotton ball.  Tape or a leg strap. Emptying the bag Pour the pee out of your bag when it is ?- full, or at least 2-3 times a day. Do this for your overnight bag and your leg bag. 1. Wash your hands with soap and water. 2. Separate (detach) the bag from your leg. 3. Hold the bag over the toilet or a clean pail. Keep the bag lower than your hips and bladder. This is so the pee (urine) does not go back into the tube. 4. Open the pour spout. It is at the bottom of the bag. 5. Empty the pee into the toilet or pail. Do not let the pour spout touch any surface. 6. Put rubbing alcohol on a gauze pad or cotton ball. 7. Use the gauze pad or  cotton ball to clean the pour spout. 8. Close the pour spout. 9. Attach the bag to  your leg with tape or a leg strap. 10. Wash your hands with soap and water. Follow instructions for cleaning the drainage bag:  From the product maker.  As told by your doctor. How to change the bag Supplies needed  Alcohol wipes.  A clean bag.  Tape or a leg strap. Changing the bag Replace your bag when it starts to leak, smell bad, or look dirty. 1. Wash your hands with soap and water. 2. Separate the dirty bag from your leg. 3. Pinch the catheter with your fingers so that pee does not spill out. 4. Separate the catheter tube from the bag tube where these tubes connect (at the connection valve). Do not let the tubes touch any surface. 5. Clean the end of the catheter tube with an alcohol wipe. Use a different alcohol wipe to clean the end of the bag tube. 6. Connect the catheter tube to the tube of the clean bag. 7. Attach the clean bag to your leg with tape or a leg strap. Do not make the bag tight on your leg. 8. Wash your hands with soap and water. General rules   Never pull on your catheter. Never try to take it out. Doing that can hurt you.  Always wash your hands before and after you touch your catheter or bag. Use a mild, fragrance-free soap. If you do not have soap and water, use hand sanitizer.  Always make sure there are no twists or bends (kinks) in the catheter tube.  Always make sure there are no leaks in the catheter or bag.  Drink enough fluid to keep your pee pale yellow.  Do not take baths, swim, or use a hot tub.  If you are female, wipe from front to back after you poop (have a bowel movement). Contact a doctor if:  Your pee is cloudy.  Your pee smells worse than usual.  Your catheter gets clogged.  Your catheter leaks.  Your bladder feels full. Get help right away if:  You have redness, swelling, or pain where the catheter goes into your body.  You have fluid, blood, pus, or a bad smell coming from the area where the catheter goes into  your body.  Your skin feels warm where the catheter goes into your body.  You have a fever.  You have pain in your: ? Belly (abdomen). ? Legs. ? Lower back. ? Bladder.  You see blood in the catheter.  Your pee is pink or red.  You feel sick to your stomach (nauseous).  You throw up (vomit).  You have chills.  Your pee is not draining into the bag.  Your catheter gets pulled out. Summary  An indwelling urinary catheter is a thin tube that is placed into the bladder to help drain pee (urine) out of the body.  The catheter is placed into the part of the body that drains pee from the bladder (urethra).  Taking good care of your catheter will keep it working properly and help prevent problems.  Always wash your hands before and after touching your catheter or bag.  Never pull on your catheter or try to take it out. This information is not intended to replace advice given to you by your health care provider. Make sure you discuss any questions you have with your health care provider. Document Released: 07/21/2012 Document Revised: 07/18/2018 Document Reviewed: 11/09/2016 Elsevier  Patient Education  [employer].

## 2019-02-20 NOTE — Progress Notes (Signed)
Physical Therapy Treatment Patient Details Name: Danielle [name] MRN: [id number] DOB: 1934/07/08 Today's Date: 02/20/2019    History of Present Illness Danielle [name] is an 84 y.o. female with multiple underlying chronic medical comorbidities including insulin-dependent diabetes, hypertension, chronic kidney disease, and coronary artery disease status post previous stenting, presented to the ER today after a mechanical fall 2 days prior to admission with subsequent inability to bear weight and ongoing complaints of right hip pain. Pt is now s/p R femur IM fixation (02/17/19)    PT Comments    Pt in bed with daughter in room upon PT arrival. Blood transfusion is complete and pt is agreeable to therapy. Pt still requires heavy assist of 2 with use of helicopter technique to come to sitting EOB despite being given extra time to initiate bed mobility. Pt was then able to sit EOB with min guard, but appears to prefer having something to hold onto for added security. Pt attempted stand with RW, but was unable even with maxA of 2 and encouragement of daughter. Pt had B knee buckling and became upset due to probable increase in pain. Pt then given stedy for transfer to chair and was able to stand with maxA of 2 to participate in transfer to recliner. Recommend continued skilled PT at this time to improve pt tolerance to activity and ability to participate in mobility to decrease caregiver burden.    Follow Up Recommendations  Home health PT;Supervision/Assistance - 24 hour Normally, when a pt presents as described in this note, I recommend SNF, but family is requesting home with HHPT due to language barrier. Daughter seems to understand amount of assist needed by the pt, still prefers home.      Equipment Recommendations  Rolling walker with 5" wheels;3in1 (PT);Wheelchair (measurements PT);Hospital bed;Wheelchair cushion (measurements PT)    Recommendations for Other Services       Precautions /  Restrictions Precautions Precautions: Fall Precaution Comments: speaks [place] Restrictions Weight Bearing Restrictions: No RLE Weight Bearing: Weight bearing as tolerated    Mobility  Bed Mobility Overal bed mobility: Needs Assistance Bed Mobility: Supine to Sit;Rolling Rolling: Max assist;+2 for physical assistance   Supine to sit: Total assist;+2 for physical assistance;HOB elevated     General bed mobility comments: Pt required maxA for rolling. Helicopter technique with use of bed pad to get pt to EOB.  Pt resistant at times and pushing back at times.  Pt unable to attempt scooting.  Transfers Overall transfer level: Needs assistance Equipment used: Rolling walker (2 wheeled) Transfers: Sit to/from [facility] Sit to Stand: +2 physical assistance;Max assist Stand pivot transfers: Total assist;+2 physical assistance       General transfer comment: Pt attempted to stand with RW, but was unable and appeared to be in a lot of pain (loud yelling), returned to bed to use stedy for safer transfer. Able to stand in stedy with maxA and VCs of 2, then able to stand in stedy with heavy use of BUE. used total assist of stedy to pivot to recliner in room. lift pad left in recliner with pt for RN staff use  Ambulation/Gait             General Gait Details: unable   Stairs             Wheelchair Mobility    Modified Rankin (Stroke Patients Only)       Balance Overall balance assessment: History of Falls;Needs assistance Sitting-balance support: Feet supported Sitting  balance-Leahy Scale: Poor Sitting balance - Comments: Pt initially with sig post lean at EOB, sig post lean (possibly slightly resistive) in recliner which complicates repositioning of pt in recliner. Postural control: Posterior lean Standing balance support: Bilateral upper extremity supported;During functional activity Standing balance-Leahy Scale: Zero Standing balance comment: pt in  stedy, full support. bilateral knee buckling during attempted stance in RW                            Cognition Arousal/Alertness: Awake/alert Behavior During Therapy: [facility] for tasks assessed/performed;Flat affect Overall Cognitive Status: Difficult to assess                                 General Comments: Daughter states pt does not have dementia, pt does not interact with therapy or daughter much through session, possibly due to impaired hearing but seems to be intermittently answering daughters questions.      Exercises      General Comments General comments (skin integrity, edema, etc.): Daughter is still reporting they prefer to take pt home, seemed concerned about pts ability to transfer at home after seeing the amount of assist and equipment needed for trasnfer to recliner today.      Pertinent Vitals/Pain Pain Assessment: Faces Faces Pain Scale: Hurts whole lot Pain Location: Grimacing and yelling with bed mobility and trasnfers Pain Descriptors / Indicators: Guarding;Grimacing;Operative site guarding Pain Intervention(s): Limited activity within patient's tolerance;Monitored during session;Repositioned    Home Living                      Prior Function            PT Goals (current goals can now be found in the care plan section) Acute Rehab PT Goals Patient Stated Goal: Pt to go home with family support PT Goal Formulation: With family Time For Goal Achievement: 03/04/19 Potential to Achieve Goals: Fair Additional Goals Additional Goal #1: Family will be knowledgeable in ways to assist pt with mobility and HEP.    Frequency    Min 5X/week      PT Plan Current plan remains appropriate    Co-evaluation              AM-PAC PT "6 Clicks" Mobility   Outcome Measure  Help needed turning from your back to your side while in a flat bed without using bedrails?: Total Help needed moving from lying on your back to sitting  on the side of a flat bed without using bedrails?: Total Help needed moving to and from a bed to a chair (including a wheelchair)?: Total Help needed standing up from a chair using your arms (e.g., wheelchair or bedside chair)?: A Lot Help needed to walk in hospital room?: Total Help needed climbing 3-5 steps with a railing? : Total 6 Click Score: 7    End of Session Equipment Utilized During Treatment: Gait belt;Oxygen;Other (comment)(stedy) Activity Tolerance: Patient limited by pain Patient left: in chair;with call bell/phone within reach;with chair alarm set;with family/visitor present Nurse Communication: Mobility status;Need for lift equipment PT Visit Diagnosis: Other abnormalities of gait and mobility (R26.89);History of falling (Z91.81)     Time: [id number] PT Time Calculation (min) (ACUTE ONLY): 26 min  Charges:  $Therapeutic Activity: 23-37 mins  Mickey Farber, PT, DPT   Acute Rehabilitation Department [phone]   Otho Bellows 02/20/2019, 3:57 PM

## 2019-02-20 NOTE — Progress Notes (Signed)
PROGRESS NOTE    Danielle [name]  [id number] DOB: [birth year] DOA: 02/14/2019 PCP: Jani Gravel, MD   Brief Narrative:  84 year old [name], lives with daughter, ambulates with help of walker, mostly sedentary, requires assistance for most ADLs, hard of hearing and refuses to wear hearing aid, PMH of NSTEMI 2012 when she presented with acute pulmonary edema, severe three-vessel CAD, s/p PCI, chronic diastolic CHF, stage III chronic kidney disease (unknown baseline), type II DM/IDDM on high doses of insulins, CVA, s/p left carotid stent, HLD, HTN, obesity, sustained mechanical fall at home 2 days PTA and presented to [facility] ED on 11/7 due to left hip pain and unable to weight-bear. Admitted for acute right intertrochanteric hip fracture, orthopedics consulted and plan surgery possibly 02/16/2019. Course complicated by acute on chronic kidney disease and anasarca, nephrology consulted. Cardiology consulted for preop clearance. S/p right hip fracture surgery 11/10.  11/11: Pt transitioned to oral torsemide by Nephrology today with first dose to be given on 11/12. PT evaluation with SNF recommendation, but family wants [facility].  11/12: Patient has mildly elevated creatinine levels noted this morning and has been assessed by nephrology.  Continue torsemide as prescribed with good urine output.  She has not ambulated much with physical therapy as of yet and family members would [name] to wait until this has happened prior to discharge.  She may need home oxygen and has some mild urinary retention noted on bladder scan.  11/13: Patient noted to have a downward trend in hemoglobin levels that is approaching need for transfusion.  We will go ahead and transfuse 1 unit PRBC today and hopefully oxygen can be weaned off.  Still working with physical therapy until safe for discharge.  Will need to follow-up with urology outpatient in 5 to 7 days regarding Foley catheter placement for postoperative urinary  retention.  Anticipate discharge in next 1-2 days to home with home health once equipment has been delivered and stable from PT perspective.  Assessment & Plan:   Active Problems:   Hip fracture (HCC)   Acute right trochanteric hip fracture  Sustained status post mechanical fall.  Patient was supposed to be for surgery 11/9. However shewasnot medically stable for surgery and hence surgery was canceled.   After cardiology and nephrology clearance, determined to be intermediate risk, underwent right femur intramedullary fixation on 11/10.  Management per orthopedics.  Patient reported right shoulder pain on 11/8 and x-ray of right shoulder without acute findings.  Acute on stage4 chronic kidney disease/anasarca  As per nephrology, creatinine 2.98 in August 2020 and hence suspect advanced stage IV CKD.  Hholding Lasix and ARB.  Nephrologyfollow-up appreciated. Renal ultrasound unremarkable.   Etiology unclear. Could be related to diabetic nephropathy and? Nephrotic range proteinuria, CKD, CHF and hypoalbuminemia.  Follow daily BMP. Also on sodium bicarbonate.  Oral torsemide starting 11/12  Outpatient follow-up with nephrology, [facility] which will be arranged.  Acute on chronic diastolic CHF  Lacks respiratory symptoms but significant third spacing/volume overloadon admission.  TTE 02/16/2019: LVEF 60-65%, severe LVH moderate MS no aortic stenosis.  Diuretic management as indicated above. ARB on hold due to acute kidney injury.  Cardiology seen this admission.  On IV diuretics per nephrology. Improving.  -1.2 L fluid balance noted today.  Acute respiratory failure with hypoxia  Secondary to decompensated CHF, and OHS.  Oxygen saturations reportedly in the 70s when patient dislodged her nasal cannula oxygen this morning.  Saturating in the low 90s on 2 L/minyesterday.  Will  need to reassess oxygen requirement prior to discharge.  CAD, s/p PCI (CFX  and LAD) in 2012 in the setting of NSTEMI & acute diastolic CHF  No anginal symptoms and patient quite sedentary at home.  Noncompliant with diet.  Is on Brilinta without aspirin. Continue atorvastatin, Imdur and metoprolol.  Brilinta on hold since 2 days prior to admission,was on hold for surgery. Resume Brilinta  on 11/12 with no overt bleeding noted  Poorly controlled type II DM with renal complications, obesity and hyperlipidemia.  Patient on high doses of N and R insulin at home, noncompliant with diet, does not check CBGs regularly.  Follows with Dr. Chalmers Cater, Endocrinology  A1c 7.5.  Adjusted insulins this admission. Currently on Levemir 35 units twice daily, NovoLog moderate sensitivity SSI and mealtime NovoLog6units 3 times daily.Better controlled than before, still mildly uncontrolled.  DM coordinator input appreciated.   Continue to monitor on current regimen overnight and consider increasing Levemir to 40 units twice if persistently elevated.  Essential hypertension  Reasonably controlled on Imdur twice daily and metoprolol 25 mg twice daily.  Added as needed IV hydralazine.  Hyperlipidemia  Continue atorvastatin.  PAD  History of left carotid stent remotely in [place].  Anemia in CKD-worsening  Transfuse 1 unit PRBC today and recheck CBC in a.m.  She is noted to have iron deficiency for which Feraheme was given 11/11  Resume home iron supplementation  Continue home Brilinta with no overt bleeding noted  Morbid obesity/Body mass index is 42.41 kg/m.  Hard of hearing  Chest x-ray abnormality  Chest x-ray 11/7: Abnormal appearance of left hilum and left lung base of uncertain significance possibly related to chronic scarring and/or airspace disease. Recommended PA and lateral x-rays, perform when able.  Follow-up chest x-ray two-view 11/8: Suggestive of pulmonary edema.  Asymptomatic bacteriuria   DVT prophylaxis:Heparin Code  Status:Full Family Communication:Daughter at bedside Disposition Plan: Anticipate discharge with home health physical therapy once equipment delivered at home.  Needs further ambulation with physical therapy today and will be safe to go home once she is transferring to rolling walker.  Will need to follow-up with urology outpatient for void trial in 5-7 days.  PRBC transfusion of 1 unit today.   Consultants:  Orthopedics  Cardiology  Nephrology  Procedures:  IM fixation R femur 11/10  Antimicrobials:  Anti-infectives (From admission, onward)   Start     Dose/Rate Route Frequency Ordered Stop   02/17/19 1530  ceFAZolin (ANCEF) IVPB 2g/100 mL premix     2 g 200 mL/hr over 30 Minutes Intravenous Every 24 hours 02/17/19 1519 02/18/19 1617   02/16/19 0600  ceFAZolin (ANCEF) IVPB 2g/100 mL premix  Status:  Discontinued     2 g 200 mL/hr over 30 Minutes Intravenous On call to O.R. 02/15/19 2006 02/17/19 0559       Subjective: Patient seen and evaluated today with no new acute complaints or concerns. No acute concerns or events noted overnight.  She continues to have declining hemoglobin levels.  She is not safe for discharge to home with home health just yet according to physical therapy.  She required placement of Foley catheter by urology yesterday.  Objective: Vitals:   02/19/19 1345 02/19/19 2136 02/20/19 0516 02/20/19 0753  BP: (!) 86/40 (!) 116/51 (!) 109/47 (!) 127/58  Pulse: 82 82 79 79  Resp: 17 17  16   Temp: 97.8 F (36.6 C) 97.7 F (36.5 C) 98.5 F (36.9 C) 97.9 F (36.6 C)  TempSrc: Oral Oral  Oral Oral  SpO2: 97% 95% 95% 98%  Weight:      Height:        Intake/Output Summary (Last 24 hours) at 02/20/2019 1059 Last data filed at 02/20/2019 1000 Gross per 24 hour  Intake 360 ml  Output 1675 ml  Net -1315 ml   Filed Weights   02/16/19 0000 02/18/19 0600  Weight: 85.8 kg 86.8 kg    Examination:  General exam: Appears calm and comfortable, obese  Respiratory system: Clear to auscultation. Respiratory effort normal.  On 2 L nasal cannula Cardiovascular system: S1 & S2 heard, RRR. No JVD, murmurs, rubs, gallops or clicks. No pedal edema. Gastrointestinal system: Abdomen is nondistended, soft and nontender. No organomegaly or masses felt. Normal bowel sounds heard. Central nervous system: Alert and awake Extremities: Symmetric 5 x 5 power. Skin: No rashes, lesions or ulcers Psychiatry: Difficult to assess given language barrier Foley with clear, yellow urine output    Data Reviewed: I have personally reviewed following labs and imaging studies  CBC: Recent Labs  Lab 02/16/19 0223 02/17/19 0557 02/18/19 0418 02/18/19 1418 02/19/19 0351 02/20/19 0313  WBC 9.7 10.9* 9.4  --  10.3 12.8*  HGB 8.9* 9.3* 7.9* 7.6* 7.4* 7.1*  HCT 29.5* 30.9* 25.7* 24.6* 23.6* 22.8*  MCV 98.0 96.3 97.3  --  95.2 93.4  PLT 163 185 179  --  181 [id number]   Basic Metabolic Panel: Recent Labs  Lab 02/16/19 0223 02/17/19 0557 02/18/19 0418 02/19/19 0351 02/20/19 0313  NA 140 141 141 139 137  K 4.7 4.7 4.2 4.2 3.9  CL 107 104 103 102 99  CO2 23 27 26 25 24   GLUCOSE 361* 254* 240* 270* 209*  BUN 62* 74* 80* 87* 93*  CREATININE 3.05* 3.21* 3.31* 3.48* 3.28*  CALCIUM 8.6* 9.0 8.0* 8.2* 8.2*  PHOS  --  5.1* 5.3* 4.6  --    GFR: Estimated Creatinine Clearance: 11.8 mL/min (A) (by C-G formula based on SCr of 3.28 mg/dL (H)). Liver Function Tests: Recent Labs  Lab 02/15/19 0514 02/17/19 0557 02/18/19 0418 02/19/19 0351  AST 17  --   --   --   ALT 11  --   --   --   ALKPHOS 169*  --   --   --   BILITOT 0.5  --   --   --   PROT 7.6  --   --   --   ALBUMIN 3.0* 2.9* 2.6* 2.4*   No results for input(s): LIPASE, AMYLASE in the last 168 hours. No results for input(s): AMMONIA in the last 168 hours. Coagulation Profile: Recent Labs  Lab 02/14/19 1658  INR 1.2   Cardiac Enzymes: No results for input(s): CKTOTAL, CKMB, CKMBINDEX, TROPONINI in  the last 168 hours. BNP (last 3 results) No results for input(s): PROBNP in the last 8760 hours. HbA1C: No results for input(s): HGBA1C in the last 72 hours. CBG: Recent Labs  Lab 02/19/19 0732 02/19/19 1143 02/19/19 1631 02/19/19 2132 02/20/19 0751  GLUCAP 238* 285* 253* 186* 188*   Lipid Profile: No results for input(s): CHOL, HDL, LDLCALC, TRIG, CHOLHDL, LDLDIRECT in the last 72 hours. Thyroid Function Tests: No results for input(s): TSH, T4TOTAL, FREET4, T3FREE, THYROIDAB in the last 72 hours. Anemia Panel: Recent Labs    02/18/19 0418  TIBC 186*  IRON 19*   Sepsis Labs: No results for input(s): PROCALCITON, LATICACIDVEN in the last 168 hours.  Recent Results (from the past 240 hour(s))  SARS  CORONAVIRUS 2 (TAT 6-24 HRS) Nasopharyngeal Nasopharyngeal Swab     Status: None   Collection Time: 02/14/19  6:16 PM   Specimen: Nasopharyngeal Swab  Result Value Ref Range Status   SARS Coronavirus 2 NEGATIVE NEGATIVE Final    Comment: (NOTE) SARS-CoV-2 target nucleic acids are NOT DETECTED. The SARS-CoV-2 RNA is generally detectable in upper and lower respiratory specimens during the acute phase of infection. Negative results Danielle [name] not preclude SARS-CoV-2 infection, Danielle [name] not rule out co-infections with other pathogens, and should not be used as the sole basis for treatment or other patient management decisions. Negative results must be combined with clinical observations, patient history, and epidemiological information. The expected result is Negative. Fact Sheet for Patients: SugarRoll.be Fact Sheet for Healthcare Providers: https://www.woods-mathews.com/ This test is not yet approved or cleared by the [place] FDA and  has been authorized for detection and/or diagnosis of SARS-CoV-2 by FDA under an Emergency Use Authorization (EUA). This EUA will remain  in effect (meaning this test can be used) for the duration of the  COVID-19 declaration under Section 56 4(b)(1) of the Act, 21 U.S.C. section 360bbb-3(b)(1), unless the authorization is terminated or revoked sooner. Performed at [facility], [place] [address] [place], [place] [address]   Surgical pcr screen     Status: Abnormal   Collection Time: 02/14/19  9:01 PM   Specimen: Nasal Mucosa; Nasal Swab  Result Value Ref Range Status   MRSA, PCR NEGATIVE NEGATIVE Final   Staphylococcus aureus POSITIVE (A) NEGATIVE Final    Comment: (NOTE) The Xpert SA Assay (FDA approved for NASAL specimens in patients 79 years of age and older), is one component of a comprehensive surveillance program. It is not intended to diagnose infection nor to guide or monitor treatment. Performed at [facility], [place] [address], [place], [place] [address]          Radiology Studies: No results found.      Scheduled Meds: . atorvastatin  40 mg Oral QHS  . calcitRIOL  0.25 mcg Oral QODAY  . Chlorhexidine Gluconate Cloth  6 each Topical Daily  . docusate sodium  100 mg Oral BID  . heparin injection (subcutaneous)  5,000 Units Subcutaneous Q12H  . influenza vaccine adjuvanted  0.5 mL Intramuscular Tomorrow-1000  . insulin aspart  0-15 Units Subcutaneous TID WC  . insulin aspart  0-5 Units Subcutaneous QHS  . insulin aspart  12 Units Subcutaneous TID WC  . insulin detemir  44 Units Subcutaneous BID  . iron polysaccharides  150 mg Oral QODAY  . isosorbide mononitrate  30 mg Oral Q breakfast  . isosorbide mononitrate  60 mg Oral QPC lunch  . metoprolol tartrate  25 mg Oral BID  . mupirocin ointment  1 application Nasal BID  . pantoprazole  40 mg Oral Daily  . polyethylene glycol  17 g Oral Daily  . senna  2 tablet Oral Daily  . ticagrelor  90 mg Oral BID  . torsemide  40 mg Oral Daily   Continuous Infusions:   LOS: 6 days    Time spent: 30 minutes    Danielle Eutsler Darleen Crocker, Danielle [name] [facility] Pager [phone]  If 7PM-7AM, please contact  night-coverage [email or web address] Password [email or web address] 02/20/2019, 10:59 AM

## 2019-02-20 NOTE — Progress Notes (Signed)
Admit: 02/14/2019 LOS: 26  84 year old female with advanced CKD, CAD, hyperlipidemia, admitted with hip fracture on the right side  Subjective:  . Required urinary catheter for retention . Stable creatinine at 3.3, 1.3 L UOP, electrolytes stable . On 2 L, working with PT  11/12 0701 - 11/13 0700 In: 120 [P.O.:120] Out: 1275 [Urine:1275]  Filed Weights   02/16/19 0000 02/18/19 0600  Weight: 85.8 kg 86.8 kg    Scheduled Meds: . atorvastatin  40 mg Oral QHS  . calcitRIOL  0.25 mcg Oral QODAY  . Chlorhexidine Gluconate Cloth  6 each Topical Daily  . docusate sodium  100 mg Oral BID  . heparin injection (subcutaneous)  5,000 Units Subcutaneous Q12H  . influenza vaccine adjuvanted  0.5 mL Intramuscular Tomorrow-1000  . insulin aspart  0-15 Units Subcutaneous TID WC  . insulin aspart  0-5 Units Subcutaneous QHS  . insulin aspart  12 Units Subcutaneous TID WC  . insulin detemir  44 Units Subcutaneous BID  . iron polysaccharides  150 mg Oral QODAY  . isosorbide mononitrate  30 mg Oral Q breakfast  . isosorbide mononitrate  60 mg Oral QPC lunch  . metoprolol tartrate  25 mg Oral BID  . mupirocin ointment  1 application Nasal BID  . pantoprazole  40 mg Oral Daily  . polyethylene glycol  17 g Oral Daily  . senna  2 tablet Oral Daily  . ticagrelor  90 mg Oral BID  . torsemide  40 mg Oral Daily   Continuous Infusions:  PRN Meds:.acetaminophen, bisacodyl, diclofenac Sodium, HYDROmorphone (DILAUDID) injection, menthol-cetylpyridinium **OR** phenol, metoCLOPramide **OR** metoCLOPramide (REGLAN) injection, ondansetron **OR** ondansetron (ZOFRAN) IV, oxyCODONE  Current Labs: reviewed    Physical Exam:  Blood pressure (!) 113/42, pulse 71, temperature 98.3 F (36.8 C), temperature source Oral, resp. rate 16, height 4\' 8"  (1.422 m), weight 86.8 kg, SpO2 97 %. NAD, lying flat in bed, obese Regular, normal S1-S2 Clear breath sounds bilaterally Trace peripheral edema No rashes or  lesions Awake alert, nonfocal  A 1. Renal failure, appears this is all CKD, advanced stage IV.  Creatinine was 2.98 in August of this year.  Slight worsening post-op, CTM, 2. Status post right hip fracture following fall,surgical repair 11/10 3. History of CAD 4. chronic diastolic heart failure  5. DM2 6. History of stroke with carotid stenting in [place] 7. Anemia, mild, normocytic  P  . Cont torsemide 40 mg daily . No further suggestions at the current time, outpatient follow-up being arranged . Will sign off for now.  Please call with any questions or concerns. Family has contact info.   Pearson Grippe MD 02/20/2019, 1:07 PM  Recent Labs  Lab 02/17/19 0557 02/18/19 0418 02/19/19 0351 02/20/19 0313  NA 141 141 139 137  K 4.7 4.2 4.2 3.9  CL 104 103 102 99  CO2 27 26 25 24   GLUCOSE 254* 240* 270* 209*  BUN 74* 80* 87* 93*  CREATININE 3.21* 3.31* 3.48* 3.28*  CALCIUM 9.0 8.0* 8.2* 8.2*  PHOS 5.1* 5.3* 4.6  --    Recent Labs  Lab 02/18/19 0418 02/18/19 1418 02/19/19 0351 02/20/19 0313  WBC 9.4  --  10.3 12.8*  HGB 7.9* 7.6* 7.4* 7.1*  HCT 25.7* 24.6* 23.6* 22.8*  MCV 97.3  --  95.2 93.4  PLT 179  --  181 194

## 2019-02-21 DIAGNOSIS — N179 Acute kidney failure, unspecified: Secondary | ICD-10-CM

## 2019-02-21 DIAGNOSIS — E860 Dehydration: Secondary | ICD-10-CM

## 2019-02-21 DIAGNOSIS — S72144A Nondisplaced intertrochanteric fracture of right femur, initial encounter for closed fracture: Secondary | ICD-10-CM

## 2019-02-21 DIAGNOSIS — W19XXXD Unspecified fall, subsequent encounter: Secondary | ICD-10-CM

## 2019-02-21 DIAGNOSIS — W19XXXA Unspecified fall, initial encounter: Secondary | ICD-10-CM

## 2019-02-21 LAB — TYPE AND SCREEN
ABO/RH(D): O POS
Antibody Screen: NEGATIVE
Unit division: 0

## 2019-02-21 LAB — BASIC METABOLIC PANEL
Anion gap: 14 (ref 5–15)
BUN: 97 mg/dL — ABNORMAL HIGH (ref 8–23)
CO2: 22 mmol/L (ref 22–32)
Calcium: 8.4 mg/dL — ABNORMAL LOW (ref 8.9–10.3)
Chloride: 100 mmol/L (ref 98–111)
Creatinine, Ser: 2.87 mg/dL — ABNORMAL HIGH (ref 0.44–1.00)
GFR calc Af Amer: 17 mL/min — ABNORMAL LOW (ref >60)
GFR calc non Af Amer: 15 mL/min — ABNORMAL LOW (ref >60)
Glucose, Bld: 252 mg/dL — ABNORMAL HIGH (ref 70–99)
Potassium: 4.3 mmol/L (ref 3.5–5.1)
Sodium: 136 mmol/L (ref 135–145)

## 2019-02-21 LAB — CBC
HCT: 27.5 % — ABNORMAL LOW (ref 36.0–46.0)
Hemoglobin: 8.9 g/dL — ABNORMAL LOW (ref 12.0–15.0)
MCH: 30.2 pg (ref 26.0–34.0)
MCHC: 32.4 g/dL (ref 30.0–36.0)
MCV: 93.2 fL (ref 80.0–100.0)
Platelets: 212 10*3/uL (ref 150–400)
RBC: 2.95 MIL/uL — ABNORMAL LOW (ref 3.87–5.11)
RDW: 14.8 % (ref 11.5–15.5)
WBC: 12.5 10*3/uL — ABNORMAL HIGH (ref 4.0–10.5)
nRBC: 0.4 % — ABNORMAL HIGH (ref 0.0–0.2)

## 2019-02-21 LAB — BPAM RBC
Blood Product Expiration Date: 202012132359
ISSUE DATE / TIME: 202011131128
Unit Type and Rh: 5100

## 2019-02-21 LAB — GLUCOSE, CAPILLARY
Glucose-Capillary: 255 mg/dL — ABNORMAL HIGH (ref 70–99)
Glucose-Capillary: 247 mg/dL — ABNORMAL HIGH (ref 70–99)

## 2019-02-21 MED ORDER — POLYETHYLENE GLYCOL 3350 17 G PO PACK: 17.0000 g | PACK | Freq: Every day | ORAL | 0 refills | Status: DC

## 2019-02-21 MED ORDER — DOCUSATE SODIUM 100 MG PO CAPS: 100.0000 mg | ORAL_CAPSULE | Freq: Two times a day (BID) | ORAL | 0 refills | Status: DC

## 2019-02-21 MED ORDER — OXYCODONE HCL 5 MG PO TABS: 5.0000 mg | ORAL_TABLET | ORAL | 0 refills | Status: AC | PRN

## 2019-02-21 MED ORDER — ISOSORBIDE MONONITRATE ER 60 MG PO TB24: 60.0000 mg | ORAL_TABLET | Freq: Every day | ORAL | 0 refills | Status: DC

## 2019-02-21 MED ORDER — TORSEMIDE 20 MG PO TABS: 40.0000 mg | ORAL_TABLET | Freq: Every day | ORAL | 0 refills | Status: AC

## 2019-02-21 MED ORDER — SENNA 8.6 MG PO TABS: 2.0000 | ORAL_TABLET | Freq: Every day | ORAL | 0 refills | Status: DC

## 2019-02-21 MED ORDER — ISOSORBIDE MONONITRATE ER 30 MG PO TB24: 30.0000 mg | ORAL_TABLET | Freq: Every day | ORAL | 0 refills | Status: AC

## 2019-02-21 NOTE — Progress Notes (Signed)
Reached out to Dr. Marthenia Rolling to clarify urology orders. Per urology and Dr. Marthenia Rolling, Danielle [name] is to be d/c home with foley cath. Bladder rest was ordered on 11/12 for 7 days, when foley was placed. Danielle [name] will be d/c home with foley and follow up with urology in office to d/c foley. Danielle [name]'s daughter at bedside and understands.

## 2019-02-21 NOTE — Progress Notes (Addendum)
Discharge paperwork and instructions given to pt. Pt not in distress and tolerated well. [facility] ready to transport pt.

## 2019-02-21 NOTE — Progress Notes (Signed)
Subjective: 4 Days Post-Op Procedure(s) (LRB): INTRAMEDULLARY (IM) NAIL RIGHT INTERTROCHANTERIC (Right) Patient reports pain as mild.    Objective: Vital signs in last 24 hours: Temp:  [97.8 F (36.6 C)-99.1 F (37.3 C)] 99 F (37.2 C) (11/14 0758) Pulse Rate:  [68-89] 68 (11/14 0758) Resp:  [16] 16 (11/14 0758) BP: (101-130)/(42-62) 121/50 (11/14 0758) SpO2:  [97 %-99 %] 97 % (11/14 0758)  Intake/Output from previous day: 11/13 0701 - 11/14 0700 In: 675 [P.O.:360; Blood:315] Out: 2600 [Urine:2600] Intake/Output this shift: No intake/output data recorded.  Recent Labs    02/18/19 1418 02/19/19 0351 02/20/19 0313 02/20/19 1551 02/21/19 0237  HGB 7.6* 7.4* 7.1* 9.1* 8.9*   Recent Labs    02/20/19 0313 02/20/19 1551 02/21/19 0237  WBC 12.8*  --  12.5*  RBC 2.44*  --  2.95*  HCT 22.8* 28.1* 27.5*  PLT 194  --  212   Recent Labs    02/20/19 0313 02/21/19 0237  NA 137 136  K 3.9 4.3  CL 99 100  CO2 24 22  BUN 93* 97*  CREATININE 3.28* 2.87*  GLUCOSE 209* 252*  CALCIUM 8.2* 8.4*   No results for input(s): LABPT, INR in the last 72 hours.  Neurologically intact ABD soft Neurovascular intact Sensation intact distally Intact pulses distally Dorsiflexion/Plantar flexion intact Incision: dressing C/D/I and no drainage No cellulitis present Compartment soft no sign of DVT   Assessment/Plan: 4 Days Post-Op Procedure(s) (LRB): INTRAMEDULLARY (IM) NAIL RIGHT INTERTROCHANTERIC (Right) Advance diet Up with therapy D/C IV fluids  Stable from ortho standpoint WBAT with walker Continue DVT ppx   Cecilie Kicks 02/21/2019, 9:05 AM

## 2019-02-21 NOTE — Discharge Summary (Signed)
Physician Discharge Summary  Patient ID: Danielle [name] MRN: [id number] DOB/AGE: 1934/10/15 84 y.o.  Admit date: 02/14/2019 Discharge date: 02/21/2019  Admission Diagnoses:  Discharge Diagnoses:  Active Problems:   Hip fracture (HCC) CKD, advanced stage IV.   Status post right hip fracture following fall,surgical repair 11/10 History of CAD chronic diastolic heart failure  DM2 History of stroke with carotid stenting in [place] Anemia, mild, normocytic  Discharged Condition: stable  Hospital Course: Patient is an 84 year old female with past medical history significant for diabetes mellitus type 2 on insulin, hyperlipidemia, hypertension, chronic kidney disease and coronary artery disease with history of prior MI.  Patient was admitted with impacted right trochanteric fracture following a mechanical fall.  Patient was admitted for further assessment and management.  Orthopedic team directed the surgical aspect of patient's care.  Patient underwent surgical repair of the right hip fracture.  Chronic medical problems were managed by the medical team.  Patient has been optimized and will be discharged.  Renal failure, appears this is all CKD, advanced stage IV.  Creatinine was 2.98 in August of this year.  Slight worsening post-op, CTM  Status post right hip fracture following fall,surgical repair 11/10   Consults: orthopedic surgery  Significant Diagnostic Studies:   Treatments: Intramedullary fixation, Right femur.   Discharge Exam: Blood pressure (!) 121/50, pulse 68, temperature 99 F (37.2 C), temperature source Oral, resp. rate 16, height 4\' 8"  (1.422 m), weight 86.8 kg, SpO2 97 %.   Disposition: Discharge disposition: 01-Home or Self Care  Discharge Instructions    Diet - low sodium heart healthy   Complete by: As directed    Diet Carb Modified   Complete by: As directed    Increase activity slowly   Complete by: As directed    Activity as per Orthopedics team and  Physical therapy     Allergies as of 02/21/2019      Reactions   Lisinopril Cough      Medication List    STOP taking these medications   amitriptyline 25 MG tablet Commonly known as: ELAVIL   COQ10 PO   furosemide 40 MG tablet Commonly known as: LASIX   losartan 50 MG tablet Commonly known as: COZAAR   Vascepa 0.5 g Caps Generic drug: Icosapent Ethyl     TAKE these medications   Accu-Chek Compact Plus test strip Generic drug: glucose blood   acetaminophen 500 MG tablet Commonly known as: TYLENOL Take 1,000 mg by mouth every 6 (six) hours as needed (pain).   atorvastatin 40 MG tablet Commonly known as: LIPITOR TAKE 1 TABLET BY MOUTH AT BEDTIME What changed: when to take this   B-D INS SYR ULTRAFINE 1CC/31G 31G X 5/16" 1 ML Misc Generic drug: Insulin Syringe-Needle U-100 Use as directed   calcitRIOL 0.25 MCG capsule Commonly known as: ROCALTROL Take 0.25 mcg by mouth every other day.   denosumab 60 MG/ML Sosy injection Commonly known as: PROLIA Inject 60 mg into the skin every 6 (six) months.   diclofenac sodium 1 % Gel Commonly known as: VOLTAREN Apply 1 application topically 3 (three) times daily as needed (pain).   docusate sodium 100 MG capsule Commonly known as: COLACE Take 1 capsule (100 mg total) by mouth 2 (two) times daily.   Dulaglutide 1.5 MG/0.5ML Sopn Inject 1.5 mg into the skin every Monday. Trulicity   insulin NPH Human 100 UNIT/ML injection Commonly known as: NOVOLIN N Inject 55 Units into the skin 2 (two) times daily before a  meal.   insulin regular 100 units/mL injection Commonly known as: NOVOLIN R Inject 40 Units into the skin 3 (three) times daily before meals.   isosorbide mononitrate 60 MG 24 hr tablet Commonly known as: IMDUR Take 1 tablet (60 mg total) by mouth daily after lunch. What changed: You were already taking a medication with the same name, and this prescription was added. Make sure you understand how and when  to take each.   isosorbide mononitrate 30 MG 24 hr tablet Commonly known as: IMDUR Take 1 tablet (30 mg total) by mouth daily with breakfast. Start taking on: February 22, 2019 What changed:   medication strength  See the new instructions.   metoprolol tartrate 25 MG tablet Commonly known as: LOPRESSOR TAKE 1 TABLET BY MOUTH 2 TIMES DAILY What changed: when to take this   oxyCODONE 5 MG immediate release tablet Commonly known as: Oxy IR/ROXICODONE Take 1 tablet (5 mg total) by mouth every 4 (four) hours as needed for moderate pain.   pantoprazole 40 MG tablet Commonly known as: PROTONIX TAKE 1 TABLET BY MOUTH EVERY DAY What changed: when to take this   Poly-Iron 150 150 MG capsule Generic drug: iron polysaccharides TAKE ONE CAPSULE BY MOUTH EVERY OTHER DAY. What changed:   how much to take  when to take this  additional instructions   polyethylene glycol 17 g packet Commonly known as: MIRALAX / GLYCOLAX Take 17 g by mouth daily. Start taking on: February 22, 2019   senna 8.6 MG Tabs tablet Commonly known as: SENOKOT Take 2 tablets (17.2 mg total) by mouth daily. Start taking on: February 22, 2019   sodium bicarbonate 325 MG tablet Take 162.5 mg by mouth daily after breakfast.   ticagrelor 90 MG Tabs tablet Commonly known as: Brilinta Take 1 tablet (90 mg total) by mouth 2 (two) times daily. NEEDS APPOINTMENT FOR FUTURE REFILLS What changed: when to take this   torsemide 20 MG tablet Commonly known as: DEMADEX Take 2 tablets (40 mg total) by mouth daily. Start taking on: February 22, 2019   VITAMIN B-12 SL Place 100 mcg under the tongue daily with lunch.            Durable Medical Equipment  (From admission, onward)         Start     Ordered   02/19/19 1030  For home use only DME Hospital bed  Once    Comments: Fully electric  Question Answer Comment  Length of Need 6 Months   Patient has (list medical condition): Hip fracture   Head must  be elevated greater than: 45 degrees   Bed type Semi-electric   Support Surface: Alternating Pressure Pad and Pump      02/19/19 1034   02/19/19 0927  For home use only DME oxygen  Once    Question Answer Comment  Length of Need 6 Months   Mode or (Route) Nasal cannula   Liters per Minute 2   Frequency Continuous (stationary and portable oxygen unit needed)   Oxygen delivery system Gas      02/19/19 0926   02/18/19 1409  For home use only DME 3 n 1  Once     02/18/19 1408   02/18/19 1408  For home use only DME Walker rolling  Once    Question:  Patient needs a walker to treat with the following condition  Answer:  Weakness   02/18/19 1408   02/18/19 1408  For home use  only DME standard manual wheelchair with seat cushion  Once    Comments: Patient suffers from weakness which impairs their ability to perform daily activities like ADLs in the home.  A walking aid will not resolve issue with performing activities of daily living. A wheelchair will allow patient to safely perform daily activities. Patient can safely propel the wheelchair in the home or has a caregiver who can provide assistance. Length of need 6 months. Accessories: elevating leg rests (ELRs), wheel locks, extensions and anti-tippers.   02/18/19 1408         Follow-up Information    Swinteck, Aaron Edelman, MD. Schedule an appointment as soon as possible for a visit in 2 weeks.   Specialty: Orthopedic Surgery Why: For wound re-check Contact information: [address] [place] [address] [place] [address] [id number]        Leonie Man, MD Follow up.   Specialty: Cardiology Why: You have a visit with Dr. Ellyn Hack scheduled for 03/12/2019 at 1:20pm. If this date/time does not work for you, please call our office to reschedule. Contact information: [address] [place] [address] [phone]        Jani Gravel, MD Follow up in 1 week(s).   Specialty: Internal Medicine Contact  information: [place] [address] [phone]        [place], [place] Follow up in 1 week(s).   Contact information: [place] [address] [phone]        [facility] Follow up.   Specialty: Home Health Services Why: Home Health physical, social worker and home health aide Contact information: [place] [address] [place], [place] Follow up.   Why: hospital bed, bedside commode, walker, home oxygen and wheelchair Contact information: [address] [place] [address] [phone]        Festus Aloe, MD. Call.   Specialty: Urology Why: to make plan for void trial/foley removal  Contact information: [place] [address] [phone]           Signed: Bonnell Public 02/21/2019, 12:06 PM

## 2019-02-21 NOTE — Care Management (Signed)
Confirmed with daughter, Manson Allan, that  Bed, WC, RW, 3n1, and oxygen were delivered to the home and ready for patient use.  RN and family request that PTAR be arranged for 4pm. A second daughter at bedside requests to ride with patient in ambulance.

## 2019-02-23 DIAGNOSIS — E1151 Type 2 diabetes mellitus with diabetic peripheral angiopathy without gangrene: Secondary | ICD-10-CM | POA: Diagnosis not present

## 2019-02-23 DIAGNOSIS — E1122 Type 2 diabetes mellitus with diabetic chronic kidney disease: Secondary | ICD-10-CM | POA: Diagnosis not present

## 2019-02-23 DIAGNOSIS — Z9181 History of falling: Secondary | ICD-10-CM | POA: Diagnosis not present

## 2019-02-23 DIAGNOSIS — I5033 Acute on chronic diastolic (congestive) heart failure: Secondary | ICD-10-CM | POA: Diagnosis not present

## 2019-02-23 DIAGNOSIS — N184 Chronic kidney disease, stage 4 (severe): Secondary | ICD-10-CM | POA: Diagnosis not present

## 2019-02-23 DIAGNOSIS — I252 Old myocardial infarction: Secondary | ICD-10-CM | POA: Diagnosis not present

## 2019-02-23 DIAGNOSIS — Z7401 Bed confinement status: Secondary | ICD-10-CM | POA: Diagnosis not present

## 2019-02-23 DIAGNOSIS — D631 Anemia in chronic kidney disease: Secondary | ICD-10-CM | POA: Diagnosis not present

## 2019-02-23 DIAGNOSIS — I2510 Atherosclerotic heart disease of native coronary artery without angina pectoris: Secondary | ICD-10-CM | POA: Diagnosis not present

## 2019-02-23 DIAGNOSIS — S72144D Nondisplaced intertrochanteric fracture of right femur, subsequent encounter for closed fracture with routine healing: Secondary | ICD-10-CM | POA: Diagnosis not present

## 2019-02-23 DIAGNOSIS — E785 Hyperlipidemia, unspecified: Secondary | ICD-10-CM | POA: Diagnosis not present

## 2019-02-23 DIAGNOSIS — Z8673 Personal history of transient ischemic attack (TIA), and cerebral infarction without residual deficits: Secondary | ICD-10-CM | POA: Diagnosis not present

## 2019-02-23 DIAGNOSIS — Z794 Long term (current) use of insulin: Secondary | ICD-10-CM | POA: Diagnosis not present

## 2019-02-23 DIAGNOSIS — Z471 Aftercare following joint replacement surgery: Secondary | ICD-10-CM | POA: Diagnosis not present

## 2019-02-23 DIAGNOSIS — I130 Hypertensive heart and chronic kidney disease with heart failure and stage 1 through stage 4 chronic kidney disease, or unspecified chronic kidney disease: Secondary | ICD-10-CM | POA: Diagnosis not present

## 2019-02-25 DIAGNOSIS — E785 Hyperlipidemia, unspecified: Secondary | ICD-10-CM | POA: Diagnosis not present

## 2019-02-25 DIAGNOSIS — D631 Anemia in chronic kidney disease: Secondary | ICD-10-CM | POA: Diagnosis not present

## 2019-02-25 DIAGNOSIS — E1122 Type 2 diabetes mellitus with diabetic chronic kidney disease: Secondary | ICD-10-CM | POA: Diagnosis not present

## 2019-02-25 DIAGNOSIS — Z794 Long term (current) use of insulin: Secondary | ICD-10-CM | POA: Diagnosis not present

## 2019-02-25 DIAGNOSIS — Z471 Aftercare following joint replacement surgery: Secondary | ICD-10-CM | POA: Diagnosis not present

## 2019-02-25 DIAGNOSIS — I2510 Atherosclerotic heart disease of native coronary artery without angina pectoris: Secondary | ICD-10-CM | POA: Diagnosis not present

## 2019-02-25 DIAGNOSIS — Z7401 Bed confinement status: Secondary | ICD-10-CM | POA: Diagnosis not present

## 2019-02-25 DIAGNOSIS — Z9181 History of falling: Secondary | ICD-10-CM | POA: Diagnosis not present

## 2019-02-25 DIAGNOSIS — I130 Hypertensive heart and chronic kidney disease with heart failure and stage 1 through stage 4 chronic kidney disease, or unspecified chronic kidney disease: Secondary | ICD-10-CM | POA: Diagnosis not present

## 2019-02-25 DIAGNOSIS — S72144D Nondisplaced intertrochanteric fracture of right femur, subsequent encounter for closed fracture with routine healing: Secondary | ICD-10-CM | POA: Diagnosis not present

## 2019-02-25 DIAGNOSIS — Z8673 Personal history of transient ischemic attack (TIA), and cerebral infarction without residual deficits: Secondary | ICD-10-CM | POA: Diagnosis not present

## 2019-02-25 DIAGNOSIS — I5033 Acute on chronic diastolic (congestive) heart failure: Secondary | ICD-10-CM | POA: Diagnosis not present

## 2019-02-25 DIAGNOSIS — I252 Old myocardial infarction: Secondary | ICD-10-CM | POA: Diagnosis not present

## 2019-02-25 DIAGNOSIS — N184 Chronic kidney disease, stage 4 (severe): Secondary | ICD-10-CM | POA: Diagnosis not present

## 2019-02-25 DIAGNOSIS — E1151 Type 2 diabetes mellitus with diabetic peripheral angiopathy without gangrene: Secondary | ICD-10-CM | POA: Diagnosis not present

## 2019-02-26 DIAGNOSIS — D631 Anemia in chronic kidney disease: Secondary | ICD-10-CM | POA: Diagnosis not present

## 2019-02-26 DIAGNOSIS — Z471 Aftercare following joint replacement surgery: Secondary | ICD-10-CM | POA: Diagnosis not present

## 2019-02-26 DIAGNOSIS — E785 Hyperlipidemia, unspecified: Secondary | ICD-10-CM | POA: Diagnosis not present

## 2019-02-26 DIAGNOSIS — I2510 Atherosclerotic heart disease of native coronary artery without angina pectoris: Secondary | ICD-10-CM | POA: Diagnosis not present

## 2019-02-26 DIAGNOSIS — N184 Chronic kidney disease, stage 4 (severe): Secondary | ICD-10-CM | POA: Diagnosis not present

## 2019-02-26 DIAGNOSIS — E1122 Type 2 diabetes mellitus with diabetic chronic kidney disease: Secondary | ICD-10-CM | POA: Diagnosis not present

## 2019-02-26 DIAGNOSIS — Z8673 Personal history of transient ischemic attack (TIA), and cerebral infarction without residual deficits: Secondary | ICD-10-CM | POA: Diagnosis not present

## 2019-02-26 DIAGNOSIS — I130 Hypertensive heart and chronic kidney disease with heart failure and stage 1 through stage 4 chronic kidney disease, or unspecified chronic kidney disease: Secondary | ICD-10-CM | POA: Diagnosis not present

## 2019-02-26 DIAGNOSIS — E1151 Type 2 diabetes mellitus with diabetic peripheral angiopathy without gangrene: Secondary | ICD-10-CM | POA: Diagnosis not present

## 2019-02-26 DIAGNOSIS — Z7401 Bed confinement status: Secondary | ICD-10-CM | POA: Diagnosis not present

## 2019-02-26 DIAGNOSIS — I5033 Acute on chronic diastolic (congestive) heart failure: Secondary | ICD-10-CM | POA: Diagnosis not present

## 2019-02-26 DIAGNOSIS — I252 Old myocardial infarction: Secondary | ICD-10-CM | POA: Diagnosis not present

## 2019-02-26 DIAGNOSIS — Z9181 History of falling: Secondary | ICD-10-CM | POA: Diagnosis not present

## 2019-02-26 DIAGNOSIS — S72144D Nondisplaced intertrochanteric fracture of right femur, subsequent encounter for closed fracture with routine healing: Secondary | ICD-10-CM | POA: Diagnosis not present

## 2019-02-26 DIAGNOSIS — Z794 Long term (current) use of insulin: Secondary | ICD-10-CM | POA: Diagnosis not present

## 2019-03-02 DIAGNOSIS — E785 Hyperlipidemia, unspecified: Secondary | ICD-10-CM | POA: Diagnosis not present

## 2019-03-02 DIAGNOSIS — I130 Hypertensive heart and chronic kidney disease with heart failure and stage 1 through stage 4 chronic kidney disease, or unspecified chronic kidney disease: Secondary | ICD-10-CM | POA: Diagnosis not present

## 2019-03-02 DIAGNOSIS — Z8673 Personal history of transient ischemic attack (TIA), and cerebral infarction without residual deficits: Secondary | ICD-10-CM | POA: Diagnosis not present

## 2019-03-02 DIAGNOSIS — E1151 Type 2 diabetes mellitus with diabetic peripheral angiopathy without gangrene: Secondary | ICD-10-CM | POA: Diagnosis not present

## 2019-03-02 DIAGNOSIS — I2510 Atherosclerotic heart disease of native coronary artery without angina pectoris: Secondary | ICD-10-CM | POA: Diagnosis not present

## 2019-03-02 DIAGNOSIS — N184 Chronic kidney disease, stage 4 (severe): Secondary | ICD-10-CM | POA: Diagnosis not present

## 2019-03-02 DIAGNOSIS — S72144D Nondisplaced intertrochanteric fracture of right femur, subsequent encounter for closed fracture with routine healing: Secondary | ICD-10-CM | POA: Diagnosis not present

## 2019-03-02 DIAGNOSIS — D631 Anemia in chronic kidney disease: Secondary | ICD-10-CM | POA: Diagnosis not present

## 2019-03-02 DIAGNOSIS — Z7401 Bed confinement status: Secondary | ICD-10-CM | POA: Diagnosis not present

## 2019-03-02 DIAGNOSIS — Z9181 History of falling: Secondary | ICD-10-CM | POA: Diagnosis not present

## 2019-03-02 DIAGNOSIS — Z471 Aftercare following joint replacement surgery: Secondary | ICD-10-CM | POA: Diagnosis not present

## 2019-03-02 DIAGNOSIS — I252 Old myocardial infarction: Secondary | ICD-10-CM | POA: Diagnosis not present

## 2019-03-02 DIAGNOSIS — Z794 Long term (current) use of insulin: Secondary | ICD-10-CM | POA: Diagnosis not present

## 2019-03-02 DIAGNOSIS — E1122 Type 2 diabetes mellitus with diabetic chronic kidney disease: Secondary | ICD-10-CM | POA: Diagnosis not present

## 2019-03-02 DIAGNOSIS — I5033 Acute on chronic diastolic (congestive) heart failure: Secondary | ICD-10-CM | POA: Diagnosis not present

## 2019-03-04 DIAGNOSIS — Z9181 History of falling: Secondary | ICD-10-CM | POA: Diagnosis not present

## 2019-03-04 DIAGNOSIS — D631 Anemia in chronic kidney disease: Secondary | ICD-10-CM | POA: Diagnosis not present

## 2019-03-04 DIAGNOSIS — E1151 Type 2 diabetes mellitus with diabetic peripheral angiopathy without gangrene: Secondary | ICD-10-CM | POA: Diagnosis not present

## 2019-03-04 DIAGNOSIS — I2510 Atherosclerotic heart disease of native coronary artery without angina pectoris: Secondary | ICD-10-CM | POA: Diagnosis not present

## 2019-03-04 DIAGNOSIS — Z471 Aftercare following joint replacement surgery: Secondary | ICD-10-CM | POA: Diagnosis not present

## 2019-03-04 DIAGNOSIS — S72144D Nondisplaced intertrochanteric fracture of right femur, subsequent encounter for closed fracture with routine healing: Secondary | ICD-10-CM | POA: Diagnosis not present

## 2019-03-04 DIAGNOSIS — E1122 Type 2 diabetes mellitus with diabetic chronic kidney disease: Secondary | ICD-10-CM | POA: Diagnosis not present

## 2019-03-04 DIAGNOSIS — Z794 Long term (current) use of insulin: Secondary | ICD-10-CM | POA: Diagnosis not present

## 2019-03-04 DIAGNOSIS — I130 Hypertensive heart and chronic kidney disease with heart failure and stage 1 through stage 4 chronic kidney disease, or unspecified chronic kidney disease: Secondary | ICD-10-CM | POA: Diagnosis not present

## 2019-03-04 DIAGNOSIS — N184 Chronic kidney disease, stage 4 (severe): Secondary | ICD-10-CM | POA: Diagnosis not present

## 2019-03-04 DIAGNOSIS — I252 Old myocardial infarction: Secondary | ICD-10-CM | POA: Diagnosis not present

## 2019-03-04 DIAGNOSIS — Z8673 Personal history of transient ischemic attack (TIA), and cerebral infarction without residual deficits: Secondary | ICD-10-CM | POA: Diagnosis not present

## 2019-03-04 DIAGNOSIS — E785 Hyperlipidemia, unspecified: Secondary | ICD-10-CM | POA: Diagnosis not present

## 2019-03-04 DIAGNOSIS — I5033 Acute on chronic diastolic (congestive) heart failure: Secondary | ICD-10-CM | POA: Diagnosis not present

## 2019-03-04 DIAGNOSIS — Z7401 Bed confinement status: Secondary | ICD-10-CM | POA: Diagnosis not present

## 2019-03-08 DIAGNOSIS — N184 Chronic kidney disease, stage 4 (severe): Secondary | ICD-10-CM | POA: Diagnosis not present

## 2019-03-08 DIAGNOSIS — E1151 Type 2 diabetes mellitus with diabetic peripheral angiopathy without gangrene: Secondary | ICD-10-CM | POA: Diagnosis not present

## 2019-03-08 DIAGNOSIS — Z794 Long term (current) use of insulin: Secondary | ICD-10-CM | POA: Diagnosis not present

## 2019-03-08 DIAGNOSIS — I252 Old myocardial infarction: Secondary | ICD-10-CM | POA: Diagnosis not present

## 2019-03-08 DIAGNOSIS — D631 Anemia in chronic kidney disease: Secondary | ICD-10-CM | POA: Diagnosis not present

## 2019-03-08 DIAGNOSIS — Z8673 Personal history of transient ischemic attack (TIA), and cerebral infarction without residual deficits: Secondary | ICD-10-CM | POA: Diagnosis not present

## 2019-03-08 DIAGNOSIS — Z9181 History of falling: Secondary | ICD-10-CM | POA: Diagnosis not present

## 2019-03-08 DIAGNOSIS — S72144D Nondisplaced intertrochanteric fracture of right femur, subsequent encounter for closed fracture with routine healing: Secondary | ICD-10-CM | POA: Diagnosis not present

## 2019-03-08 DIAGNOSIS — I5033 Acute on chronic diastolic (congestive) heart failure: Secondary | ICD-10-CM | POA: Diagnosis not present

## 2019-03-08 DIAGNOSIS — I2510 Atherosclerotic heart disease of native coronary artery without angina pectoris: Secondary | ICD-10-CM | POA: Diagnosis not present

## 2019-03-08 DIAGNOSIS — E1122 Type 2 diabetes mellitus with diabetic chronic kidney disease: Secondary | ICD-10-CM | POA: Diagnosis not present

## 2019-03-08 DIAGNOSIS — Z7401 Bed confinement status: Secondary | ICD-10-CM | POA: Diagnosis not present

## 2019-03-08 DIAGNOSIS — Z471 Aftercare following joint replacement surgery: Secondary | ICD-10-CM | POA: Diagnosis not present

## 2019-03-08 DIAGNOSIS — E785 Hyperlipidemia, unspecified: Secondary | ICD-10-CM | POA: Diagnosis not present

## 2019-03-08 DIAGNOSIS — I130 Hypertensive heart and chronic kidney disease with heart failure and stage 1 through stage 4 chronic kidney disease, or unspecified chronic kidney disease: Secondary | ICD-10-CM | POA: Diagnosis not present

## 2019-03-09 DIAGNOSIS — I130 Hypertensive heart and chronic kidney disease with heart failure and stage 1 through stage 4 chronic kidney disease, or unspecified chronic kidney disease: Secondary | ICD-10-CM | POA: Diagnosis not present

## 2019-03-09 DIAGNOSIS — I5033 Acute on chronic diastolic (congestive) heart failure: Secondary | ICD-10-CM | POA: Diagnosis not present

## 2019-03-09 DIAGNOSIS — Z9181 History of falling: Secondary | ICD-10-CM | POA: Diagnosis not present

## 2019-03-09 DIAGNOSIS — I2510 Atherosclerotic heart disease of native coronary artery without angina pectoris: Secondary | ICD-10-CM | POA: Diagnosis not present

## 2019-03-09 DIAGNOSIS — N184 Chronic kidney disease, stage 4 (severe): Secondary | ICD-10-CM | POA: Diagnosis not present

## 2019-03-09 DIAGNOSIS — Z7401 Bed confinement status: Secondary | ICD-10-CM | POA: Diagnosis not present

## 2019-03-09 DIAGNOSIS — E785 Hyperlipidemia, unspecified: Secondary | ICD-10-CM | POA: Diagnosis not present

## 2019-03-09 DIAGNOSIS — E1151 Type 2 diabetes mellitus with diabetic peripheral angiopathy without gangrene: Secondary | ICD-10-CM | POA: Diagnosis not present

## 2019-03-09 DIAGNOSIS — Z8673 Personal history of transient ischemic attack (TIA), and cerebral infarction without residual deficits: Secondary | ICD-10-CM | POA: Diagnosis not present

## 2019-03-09 DIAGNOSIS — I252 Old myocardial infarction: Secondary | ICD-10-CM | POA: Diagnosis not present

## 2019-03-09 DIAGNOSIS — D631 Anemia in chronic kidney disease: Secondary | ICD-10-CM | POA: Diagnosis not present

## 2019-03-09 DIAGNOSIS — E1122 Type 2 diabetes mellitus with diabetic chronic kidney disease: Secondary | ICD-10-CM | POA: Diagnosis not present

## 2019-03-09 DIAGNOSIS — Z471 Aftercare following joint replacement surgery: Secondary | ICD-10-CM | POA: Diagnosis not present

## 2019-03-09 DIAGNOSIS — S72144D Nondisplaced intertrochanteric fracture of right femur, subsequent encounter for closed fracture with routine healing: Secondary | ICD-10-CM | POA: Diagnosis not present

## 2019-03-09 DIAGNOSIS — Z794 Long term (current) use of insulin: Secondary | ICD-10-CM | POA: Diagnosis not present

## 2019-03-09 DIAGNOSIS — S72141D Displaced intertrochanteric fracture of right femur, subsequent encounter for closed fracture with routine healing: Secondary | ICD-10-CM | POA: Diagnosis not present

## 2019-03-10 DIAGNOSIS — Z7401 Bed confinement status: Secondary | ICD-10-CM | POA: Diagnosis not present

## 2019-03-10 DIAGNOSIS — I2510 Atherosclerotic heart disease of native coronary artery without angina pectoris: Secondary | ICD-10-CM | POA: Diagnosis not present

## 2019-03-10 DIAGNOSIS — Z471 Aftercare following joint replacement surgery: Secondary | ICD-10-CM | POA: Diagnosis not present

## 2019-03-10 DIAGNOSIS — E1122 Type 2 diabetes mellitus with diabetic chronic kidney disease: Secondary | ICD-10-CM | POA: Diagnosis not present

## 2019-03-10 DIAGNOSIS — E785 Hyperlipidemia, unspecified: Secondary | ICD-10-CM | POA: Diagnosis not present

## 2019-03-10 DIAGNOSIS — D631 Anemia in chronic kidney disease: Secondary | ICD-10-CM | POA: Diagnosis not present

## 2019-03-10 DIAGNOSIS — S72144D Nondisplaced intertrochanteric fracture of right femur, subsequent encounter for closed fracture with routine healing: Secondary | ICD-10-CM | POA: Diagnosis not present

## 2019-03-10 DIAGNOSIS — I252 Old myocardial infarction: Secondary | ICD-10-CM | POA: Diagnosis not present

## 2019-03-10 DIAGNOSIS — E1151 Type 2 diabetes mellitus with diabetic peripheral angiopathy without gangrene: Secondary | ICD-10-CM | POA: Diagnosis not present

## 2019-03-10 DIAGNOSIS — Z9181 History of falling: Secondary | ICD-10-CM | POA: Diagnosis not present

## 2019-03-10 DIAGNOSIS — Z8673 Personal history of transient ischemic attack (TIA), and cerebral infarction without residual deficits: Secondary | ICD-10-CM | POA: Diagnosis not present

## 2019-03-10 DIAGNOSIS — N184 Chronic kidney disease, stage 4 (severe): Secondary | ICD-10-CM | POA: Diagnosis not present

## 2019-03-10 DIAGNOSIS — Z794 Long term (current) use of insulin: Secondary | ICD-10-CM | POA: Diagnosis not present

## 2019-03-10 DIAGNOSIS — I5033 Acute on chronic diastolic (congestive) heart failure: Secondary | ICD-10-CM | POA: Diagnosis not present

## 2019-03-10 DIAGNOSIS — I130 Hypertensive heart and chronic kidney disease with heart failure and stage 1 through stage 4 chronic kidney disease, or unspecified chronic kidney disease: Secondary | ICD-10-CM | POA: Diagnosis not present

## 2019-03-11 DIAGNOSIS — E785 Hyperlipidemia, unspecified: Secondary | ICD-10-CM | POA: Diagnosis not present

## 2019-03-11 DIAGNOSIS — N184 Chronic kidney disease, stage 4 (severe): Secondary | ICD-10-CM | POA: Diagnosis not present

## 2019-03-11 DIAGNOSIS — Z8673 Personal history of transient ischemic attack (TIA), and cerebral infarction without residual deficits: Secondary | ICD-10-CM | POA: Diagnosis not present

## 2019-03-11 DIAGNOSIS — S72144D Nondisplaced intertrochanteric fracture of right femur, subsequent encounter for closed fracture with routine healing: Secondary | ICD-10-CM | POA: Diagnosis not present

## 2019-03-11 DIAGNOSIS — E1122 Type 2 diabetes mellitus with diabetic chronic kidney disease: Secondary | ICD-10-CM | POA: Diagnosis not present

## 2019-03-11 DIAGNOSIS — N2581 Secondary hyperparathyroidism of renal origin: Secondary | ICD-10-CM | POA: Diagnosis not present

## 2019-03-11 DIAGNOSIS — Z471 Aftercare following joint replacement surgery: Secondary | ICD-10-CM | POA: Diagnosis not present

## 2019-03-11 DIAGNOSIS — I5033 Acute on chronic diastolic (congestive) heart failure: Secondary | ICD-10-CM | POA: Diagnosis not present

## 2019-03-11 DIAGNOSIS — Z9181 History of falling: Secondary | ICD-10-CM | POA: Diagnosis not present

## 2019-03-11 DIAGNOSIS — I2510 Atherosclerotic heart disease of native coronary artery without angina pectoris: Secondary | ICD-10-CM | POA: Diagnosis not present

## 2019-03-11 DIAGNOSIS — D631 Anemia in chronic kidney disease: Secondary | ICD-10-CM | POA: Diagnosis not present

## 2019-03-11 DIAGNOSIS — E1151 Type 2 diabetes mellitus with diabetic peripheral angiopathy without gangrene: Secondary | ICD-10-CM | POA: Diagnosis not present

## 2019-03-11 DIAGNOSIS — R809 Proteinuria, unspecified: Secondary | ICD-10-CM | POA: Diagnosis not present

## 2019-03-11 DIAGNOSIS — I130 Hypertensive heart and chronic kidney disease with heart failure and stage 1 through stage 4 chronic kidney disease, or unspecified chronic kidney disease: Secondary | ICD-10-CM | POA: Diagnosis not present

## 2019-03-11 DIAGNOSIS — Z7401 Bed confinement status: Secondary | ICD-10-CM | POA: Diagnosis not present

## 2019-03-11 DIAGNOSIS — Z794 Long term (current) use of insulin: Secondary | ICD-10-CM | POA: Diagnosis not present

## 2019-03-11 DIAGNOSIS — I252 Old myocardial infarction: Secondary | ICD-10-CM | POA: Diagnosis not present

## 2019-03-12 ENCOUNTER — Encounter: Payer: Self-pay | Admitting: Cardiology

## 2019-03-12 ENCOUNTER — Other Ambulatory Visit: Payer: Self-pay

## 2019-03-12 ENCOUNTER — Ambulatory Visit: Payer: Medicare Other | Admitting: Cardiology

## 2019-03-12 VITALS — BP 106/52 | Temp 96.8°F | Ht <= 58 in

## 2019-03-12 DIAGNOSIS — E1122 Type 2 diabetes mellitus with diabetic chronic kidney disease: Secondary | ICD-10-CM | POA: Diagnosis not present

## 2019-03-12 DIAGNOSIS — I5032 Chronic diastolic (congestive) heart failure: Secondary | ICD-10-CM

## 2019-03-12 DIAGNOSIS — I252 Old myocardial infarction: Secondary | ICD-10-CM | POA: Diagnosis not present

## 2019-03-12 DIAGNOSIS — I2510 Atherosclerotic heart disease of native coronary artery without angina pectoris: Secondary | ICD-10-CM

## 2019-03-12 DIAGNOSIS — I10 Essential (primary) hypertension: Secondary | ICD-10-CM

## 2019-03-12 DIAGNOSIS — I5033 Acute on chronic diastolic (congestive) heart failure: Secondary | ICD-10-CM | POA: Diagnosis not present

## 2019-03-12 DIAGNOSIS — E785 Hyperlipidemia, unspecified: Secondary | ICD-10-CM

## 2019-03-12 DIAGNOSIS — Z7401 Bed confinement status: Secondary | ICD-10-CM | POA: Diagnosis not present

## 2019-03-12 DIAGNOSIS — Z471 Aftercare following joint replacement surgery: Secondary | ICD-10-CM | POA: Diagnosis not present

## 2019-03-12 DIAGNOSIS — N183 Chronic kidney disease, stage 3 unspecified: Secondary | ICD-10-CM

## 2019-03-12 DIAGNOSIS — S72144D Nondisplaced intertrochanteric fracture of right femur, subsequent encounter for closed fracture with routine healing: Secondary | ICD-10-CM | POA: Diagnosis not present

## 2019-03-12 DIAGNOSIS — E1151 Type 2 diabetes mellitus with diabetic peripheral angiopathy without gangrene: Secondary | ICD-10-CM | POA: Diagnosis not present

## 2019-03-12 DIAGNOSIS — Z8673 Personal history of transient ischemic attack (TIA), and cerebral infarction without residual deficits: Secondary | ICD-10-CM | POA: Diagnosis not present

## 2019-03-12 DIAGNOSIS — Z23 Encounter for immunization: Secondary | ICD-10-CM | POA: Diagnosis not present

## 2019-03-12 DIAGNOSIS — D631 Anemia in chronic kidney disease: Secondary | ICD-10-CM | POA: Diagnosis not present

## 2019-03-12 DIAGNOSIS — Z9181 History of falling: Secondary | ICD-10-CM | POA: Diagnosis not present

## 2019-03-12 DIAGNOSIS — Z9861 Coronary angioplasty status: Secondary | ICD-10-CM

## 2019-03-12 DIAGNOSIS — I214 Non-ST elevation (NSTEMI) myocardial infarction: Secondary | ICD-10-CM

## 2019-03-12 DIAGNOSIS — I130 Hypertensive heart and chronic kidney disease with heart failure and stage 1 through stage 4 chronic kidney disease, or unspecified chronic kidney disease: Secondary | ICD-10-CM | POA: Diagnosis not present

## 2019-03-12 DIAGNOSIS — Z794 Long term (current) use of insulin: Secondary | ICD-10-CM | POA: Diagnosis not present

## 2019-03-12 DIAGNOSIS — N184 Chronic kidney disease, stage 4 (severe): Secondary | ICD-10-CM | POA: Diagnosis not present

## 2019-03-12 NOTE — Patient Instructions (Signed)

## 2019-03-12 NOTE — Progress Notes (Signed)
Primary Care Provider: Jani Gravel, MD Cardiologist: Glenetta Hew, MD Electrophysiologist:   Clinic Note: Chief Complaint  Patient presents with  . Hospitalization Follow-up    Recent fall with hip fracture status post ORIF  . Coronary Artery Disease    No angina  . Congestive Heart Failure    Chronic HFpEF; stable    HPI:    Danielle [name] is a 84 y.o. female with a PMH below who presents today for what a mass will be a 2-year follow-up/hospital follow-up after recent hip fracture..  NSTEMI with Acute Combined HF in Nov 2012 -- severe pulmonary edema; after diuresis, Cath with Staged PCI ? PCI to LAD, LCx-OM with 2 stents; diffusely diseased RCA was not re-vascularizeable via PCI or CABG  Myoview in summer 2105 - INTERMEDIATE RISK - Inferior wall ischemia - but consistent with known RCA disease.  NO Invasive evaluation.  Opted for medical management. ? EF improved. - Now HFPF. ? Prior L CVA (while in [place]) s/p L Carotid Stent  - minimal residual Sx. ? Also has difficult control diabetes, & CKD III-IV with chronic anemia.   Her daughter usually accompanies her and serves as her interpreter.  Is actually easier than using the contracted interpreter because Danielle [name] does not provide much of the answers anyway  I last saw her back in October 2018.  Was doing well.  Stable from a cardiac standpoint.  Not really doing much in the way exercise.  Limited by ankle and knee pain.  Walks some on treadmill.  Able to do most ADLs.  She was a little bit depressed because her husband just died.  Recent Hospitalizations:   February 14, 2019 admitted after a fall with hip fracture (right intertrochanteric fracture)--s/p ORIF.Marland Kitchen  Danielle [name] was last seen on November 8 for preoperative evaluation by Dr. Johnsie Cancel for her right hip surgery.  Echocardiogram performed reviewed below demonstrated moderate mitral stenosis and aortic sclerosis but normal EF.  Suggested severe LVH, but normal atrial size and  indeterminate diastolic pressures.  Reviewed  CV studies:    The following studies were reviewed today: (if available, images/films reviewed: From Epic Chart or Care Everywhere) . Echo 02/16/2019: EF 60 to 65%.  Severe LVH.  Unable to determine diastolic parameters.  Normal RV size.  Normal biatrial size.  Moderate mitral stenosis.  Mild to moderate aortic sclerosis but no stenosis.  Unable to assess pulmonary arterial pressure.  History provided by her daughter who serves as Astronomer.  The patient herself would not answer questions via contracted interpreter anyway.  Previous attempted using contracted interpreter did not go well.  Her daughter speaks fluent English, and is also very familiar with her mother's health.  For the most part the daughter answers questions that she is able to answer and will specifically ask questions of her mother for clarification.  Interval History:   Danielle [name] returns here today pretty stable from cardiac standpoint.  She did well with her surgery.  Gradually gaining back to what ever mobility she had had.  She did not lose consciousness, had a mechanical fall leading to her hip fracture.  This is only serving to make her more deconditioned. Several medications were stopped prior to her discharge and now she is only on Imdur Lopressor and was switched to torsemide.  No longer on ARB.  Pretty stable from cardiac standpoint.  Doing rehab.  CV Review of Symptoms (Summary) positive for - chest pain, edema and That has  really trivial.  Stable.  Unable to really assess for exertional dyspnea because he did not really doing much.  Right now she struggles with doing physical therapy because of her hip and deconditioning. negative for - chest pain, irregular heartbeat, orthopnea, palpitations, paroxysmal nocturnal dyspnea, rapid heart rate, shortness of breath or Syncope/near syncope, TIA Summers fugax, claudication  The patient does not have symptoms concerning for  COVID-19 infection (fever, chills, cough, or new shortness of breath).  The patient is practicing social distancing.  Barely goes out.  She does wear her mask when doing her outpatient home PT rehab   REVIEWED OF SYSTEMS   A comprehensive ROS was performed. Review of Systems  Constitutional: Negative for malaise/fatigue (Just does not do much, hard to tell) and weight loss.  HENT: Positive for hearing loss. Negative for congestion and nosebleeds.   Respiratory: Negative for shortness of breath and wheezing.   Gastrointestinal: Negative for blood in stool, heartburn and melena.  Genitourinary: Negative for hematuria.  Musculoskeletal: Positive for joint pain (Obviously her hip, but is always had knee pain).  Neurological: Positive for weakness (Global). Negative for dizziness (When she first stands up, but otherwise just unsteady gait.).  Endo/Heme/Allergies: Negative for environmental allergies.  Psychiatric/Behavioral: Positive for depression (She is still down and has been since her husband died.) and memory loss (Probably, but hard to assess). The patient is not nervous/anxious and does not have insomnia.   All other systems reviewed and are negative.  Hard of hearing.  Leg swelling.  Joint pain.  I have reviewed and (if needed) personally updated the patient's problem list, medications, allergies, past medical and surgical history, social and family history.   PAST MEDICAL HISTORY   Past Medical History:  Diagnosis Date  . CAD S/P percutaneous coronary angioplasty November 2012   a)Severe 3 Vessel, s/p PCI with Resolute DES to LAD x1, LCx/OM x2, diffise distal LAD & entire RCA disease - not PCI amenable;; b) Myoview 12/2013: EF 67%,OINTERMEDIATE RISK - Inferior Ischemia c/w known anatomy (RCA CAD that is not PCI amenable)     . Chronic diastolic heart failure, NYHA class 2-3    Grade 1 Diastolic Dysfunction by echo  . CKD (chronic kidney disease), stage III   . Diabetes mellitus,  type II, insulin dependent ([place])     Difficult to control; in obese, with CAD and PAD complications   . History of Non-ST elevated myocardial infarction (non-STEMI) 03/06/2011   Presentation was acute diastolic heart failure with edema and pulmonary edema.  Symptom was dyspnea but not specifically anginal chest pressure.  Marland Kitchen History of seasonal allergies   . History of stroke with residual effects Unsure   Old left-sided CVA --> S/P Left Carotid STENT in [place];   . History of stroke without residual deficits August 2011   Right Internal Capsule Stroke, nonhemorrhagic  . Hyperlipidemia   . Hypertension   . Obesity (BMI 30-39.9)   . Osteoarthritis      PAST SURGICAL HISTORY   Past Surgical History:  Procedure Laterality Date  . CAROTID STENT Left    Performed in [place]  . CHOLECYSTECTOMY    . CORONARY ANGIOPLASTY WITH STENT PLACEMENT  03/16/11   LAD x1 (3.0 mm 20 mm (postdilated - 3.3 mm) Resolute DES, LCx/OM1 x2; distal 2.5 mm x 14 mm and proximal 3.0 mm at 26 mm Resolute DES; post-dilation 2.9 distal, 3.25 proximal  . INTRAMEDULLARY (IM) NAIL INTERTROCHANTERIC Right 02/17/2019   Procedure: INTRAMEDULLARY (IM) NAIL RIGHT INTERTROCHANTERIC;  Surgeon: Rod Can, MD;  Location: [facility];  Service: Orthopedics;  Laterality: Right;  . INTRAVASCULAR ULTRASOUND  03/12/2011   Procedure: INTRAVASCULAR ULTRASOUND;  Surgeon: Leonie Man, MD;  Location[facility] CATH LAB;  Service: Cardiovascular;;  . LEFT HEART CATHETERIZATION WITH CORONARY ANGIOGRAM N/A 03/12/2011   Procedure: LEFT HEART CATHETERIZATION WITH CORONARY ANGIOGRAM;  Surgeon: Leonie Man, MD;  Location: [facility] CATH LAB;  Service: Cardiovascular;  Laterality: N/A;  . NM MYOVIEW LTD  September 2015   EF 67%,OINTERMEDIATE RISK - Inferior Ischemia c/w known anatomy (RCA CAD that is not PCI amenable)     . PERCUTANEOUS CORONARY STENT INTERVENTION (PCI-S) N/A 03/16/2011   Procedure: PERCUTANEOUS CORONARY STENT INTERVENTION (PCI-S);  Surgeon:  Troy Sine, MD;  Location: [facility] CATH LAB;  Service: Cardiovascular;  Laterality: N/A;  . TRANSTHORACIC ECHOCARDIOGRAM  03/07/2011   EF 55-60%; moderate concentric LVH, mild posterolateral hypokinesis.  Grade 1 Diastolic Dysfunction with high filling pressures.  . TRANSTHORACIC ECHOCARDIOGRAM  02/16/2019   EF 60 to 65%.  Severe LVH.  Unable to determine diastolic parameters.  Normal RV size.  Normal biatrial size.  Moderate mitral stenosis.  Mild to moderate aortic sclerosis but no stenosis.  Unable to assess pulmonary arterial pressure.     MEDICATIONS/ALLERGIES   Current Meds  Medication Sig  . ACCU-CHEK COMPACT PLUS test strip   . acetaminophen (TYLENOL) 500 MG tablet Take 1,000 mg by mouth every 6 (six) hours as needed (pain).  Marland Kitchen atorvastatin (LIPITOR) 40 MG tablet TAKE 1 TABLET BY MOUTH AT BEDTIME (Patient taking differently: Take 40 mg by mouth daily after supper. )  . B-D INS SYR ULTRAFINE 1CC/31G 31G X 5/16" 1 ML MISC Use as directed  . calcitRIOL (ROCALTROL) 0.25 MCG capsule Take 0.25 mcg by mouth every other day.  . denosumab (PROLIA) 60 MG/ML SOSY injection Inject 60 mg into the skin every 6 (six) months.  . diclofenac sodium (VOLTAREN) 1 % GEL Apply 1 application topically 3 (three) times daily as needed (pain).  . Dulaglutide 1.5 MG/0.5ML SOPN Inject 1.5 mg into the skin every Monday. Trulicity  . insulin NPH (HUMULIN N,NOVOLIN N) 100 UNIT/ML injection Inject 55 Units into the skin 2 (two) times daily before a meal.   . insulin regular (NOVOLIN R,HUMULIN R) 100 units/mL injection Inject 40 Units into the skin 3 (three) times daily before meals.   . isosorbide mononitrate (IMDUR) 30 MG 24 hr tablet Take 1 tablet (30 mg total) by mouth daily with breakfast.  . metoprolol tartrate (LOPRESSOR) 25 MG tablet TAKE 1 TABLET BY MOUTH 2 TIMES DAILY (Patient taking differently: Take 25 mg by mouth 2 (two) times daily after a meal. )  . oxyCODONE (OXY IR/ROXICODONE) 5 MG immediate release  tablet Take 1 tablet (5 mg total) by mouth every 4 (four) hours as needed for moderate pain.  . pantoprazole (PROTONIX) 40 MG tablet TAKE 1 TABLET BY MOUTH EVERY DAY (Patient taking differently: Take 40 mg by mouth daily with lunch. )  . POLY-IRON 150 150 MG capsule TAKE ONE CAPSULE BY MOUTH EVERY OTHER DAY. (Patient taking differently: Take 150 mg by mouth See admin instructions. Take one capsule (150 mg) by mouth every other day after lunch)  . sodium bicarbonate 325 MG tablet Take 162.5 mg by mouth daily after breakfast.   . ticagrelor (BRILINTA) 90 MG TABS tablet Take 1 tablet (90 mg total) by mouth 2 (two) times daily. NEEDS APPOINTMENT FOR FUTURE REFILLS (Patient taking differently: Take 90 mg  by mouth 2 (two) times daily after a meal. NEEDS APPOINTMENT FOR FUTURE REFILLS)  . torsemide (DEMADEX) 20 MG tablet Take 2 tablets (40 mg total) by mouth daily.  . traZODone (DESYREL) 50 MG tablet Take 50 mg by mouth at bedtime as needed.  . vitamin B-12 (CYANOCOBALAMIN) 100 MCG tablet Take 100 mcg by mouth daily.  . [DISCONTINUED] Cyanocobalamin (VITAMIN B-12 SL) Place 100 mcg under the tongue daily with lunch.  . [DISCONTINUED] docusate sodium (COLACE) 100 MG capsule Take 1 capsule (100 mg total) by mouth 2 (two) times daily.  . [DISCONTINUED] isosorbide mononitrate (IMDUR) 60 MG 24 hr tablet Take 1 tablet (60 mg total) by mouth daily after lunch.  . [DISCONTINUED] polyethylene glycol (MIRALAX / GLYCOLAX) 17 g packet Take 17 g by mouth daily.  . [DISCONTINUED] senna (SENOKOT) 8.6 MG TABS tablet Take 2 tablets (17.2 mg total) by mouth daily.    Allergies  Allergen Reactions  . Lisinopril Cough     SOCIAL HISTORY/FAMILY HISTORY   Social History   Tobacco Use  . Smoking status: Never Smoker  . Smokeless tobacco: Never Used  Substance Use Topics  . Alcohol use: No  . Drug use: No   Social History   Social History Narrative   She is a married, mother of 68 with 6 grandchildren.  She does  not smoke or drink.  She is essentially sedentary, does not do much at all besides around the house.  She does range of motion exercises for a few minutes in the morning, but does not get routine walking or aerobic exercise.  She speaks no [place], but her daughter is essentially her primary care provider both in the house as well as for clinic visits.    Family History Family history is unknown by patient.   OBJCTIVE -PE, EKG, labs   Wt Readings from Last 3 Encounters:  02/18/19 191 lb 5.8 oz (86.8 kg)  01/22/17 189 lb 3.2 oz (85.8 kg)  09/14/16 185 lb (83.9 kg)    Physical Exam: BP (!) 106/52   Temp (!) 96.8 F (36 C)   Ht 4\' 8"  (1.422 m)   SpO2 98%   BMI 42.90 kg/m  Physical Exam  Constitutional: She is oriented to person, place, and time. She appears well-developed and well-nourished. No distress.  Pleasant morbidly obese elderly woman.  Currently in a wheelchair.  Well-groomed.  HENT:  Head: Normocephalic and atraumatic.  Neck: Normal range of motion. Neck supple. No JVD (Cannot assess due to body habitus) present. Carotid bruit is not present.  Cardiovascular: Normal rate, regular rhythm, S1 normal and S2 normal.  No extrasystoles are present. PMI is not displaced (Unable to palpate). Exam reveals distant heart sounds and decreased pulses (Difficult to palpate pedal pulses because of obesity and' mild edema). Exam reveals no gallop and no friction rub.  Murmur (Soft 1/6 SEM at RUSB.) heard. Pulmonary/Chest: Effort normal and breath sounds normal. No respiratory distress. She has no wheezes. She has no rales.  Abdominal: Soft. Bowel sounds are normal. She exhibits no distension. There is no abdominal tenderness. There is no rebound.  Obese.  Unable assess HSM.  Musculoskeletal: Normal range of motion.        General: Edema (Trace bilateral pedal) present.  Neurological: She is alert and oriented to person, place, and time.  Skin: Skin is warm and dry.  Psychiatric: She has  a normal mood and affect.  She seems to be quite agitated today.  Not happy about  being here.  Uncomfortable sitting in the chair.  Very jittery.  Unlike usual visits, she is actually arguing with her daughter.  Cannot really determine her thought content or judgment based on language barrier.   Adult ECG Report n/a  Recent Labs: November 27, 2018: TC 149, TG 235, HDL 40.  LDL not available.   February 21, 2019: Hgb 8.9.  A1c 7.5. Lab Results  Component Value Date   CHOL 116 03/07/2011   HDL 60 03/07/2011   LDLCALC 36 03/07/2011   TRIG 100 03/07/2011   CHOLHDL 1.9 03/07/2011   Lab Results  Component Value Date   CREATININE 2.87 (H) 02/21/2019   BUN 97 (H) 02/21/2019   NA 136 02/21/2019   K 4.3 02/21/2019   CL 100 02/21/2019   CO2 22 02/21/2019    ASSESSMENT/PLAN    Problem List Items Addressed This Visit    Chronic diastolic CHF (congestive heart failure), NYHA class 2 (HCC) (Chronic)    No real active heart failure symptoms.  Stable edema.  Continue torsemide.  Tolerating low-dose Lopressor and Imdur.  Not on ARB any longer due to borderline hypotension.      History of: Non-ST elevated myocardial infarction (non-STEMI) - inferior lateral ST depression, peak troponin 16 (Chronic)    Distant history of MI.  Mostly inferior and inferior lateral.  The presentation was with an acute CHF exacerbation.  She has been pretty much stable since PCI at that time.  Preserved EF by echo.  No recurrent angina or significant CHF.      CAD (coronary artery disease), CFX (x2) and LAD PCI in setting of non-STEMI and acute diastolic heart failure - Primary (Chronic)    Diffuse multivessel disease but no further angina or heart failure symptoms since her PCI of the LAD and circumflex.  She is not very active but is not having any angina symptoms. Plan: Continue statin, Imdur, beta-blocker and current maintenance dose of Brilinta.  No aspirin.  Okay to hold Brilinta for surgeries (5 to 7  days preop).  She is on maintenance dose of 60 mg twice daily.      Essential hypertension (Chronic)    Blood pressure actually is borderline low.  Now longer on ARB.  She is on stable dose of Imdur and metoprolol along with torsemide.  Was converted to torsemide for better responsiveness.      Dyslipidemia, goal LDL below 70 (Chronic)    Labs have been pretty stable on atorvastatin.  Had been on fenofibrate.  No longer on them.  PCP/endocrinology have been following her labs and I do not have the lipid panel for this year.  Appears to be stable.      Chronic renal insufficiency, stage III (moderate) (Chronic)    Renal function definitely got worse during her hospitalization.  ARB was discontinued.  Would not restart now unless blood pressure becomes significant elevated.  Also converted to torsemide for better bioavailability.       Other Visit Diagnoses    Need for immunization against influenza       Relevant Orders   Flu Vaccine QUAD High Dose(Fluad) (Completed)       COVID-19 Education: The signs and symptoms of COVID-19 were discussed with the patient and how to seek care for testing (follow up with PCP or arrange E-visit).   The importance of social distancing was discussed today.  I spent a total of 16minutes with the patient and chart review. >  50% of the time  was spent in direct patient consultation.  Additional time spent with chart review (studies, outside notes, etc): 10 Total Time: 28 min   Current medicines are reviewed at length with the patient today.  (+/- concerns) none   Patient Instructions / Medication Changes & Studies & Tests Ordered   Patient Instructions  Medication Instructions:  NO CHANGES *If you need a refill on your cardiac medications before your next appointment, please call your pharmacy*  Lab Work: NOT NEEDED  Testing/Procedures: NOT NEEDED  Follow-Up: At [facility], you and your health needs are our priority.  As part of our  continuing mission to provide you with exceptional heart care, we have created designated Provider Care Teams.  These Care Teams include your primary Cardiologist (physician) and Advanced Practice Providers (APPs -  Physician Assistants and Nurse Practitioners) who all work together to provide you with the care you need, when you need it.  Your next appointment:   12 month(s)  The format for your next appointment:   In Person  Provider:   Glenetta Hew, MD  Other Instructions     Studies Ordered:   Orders Placed This Encounter  Procedures  . Flu Vaccine QUAD High Dose(Fluad)     Glenetta Hew, M.D., M.S. Interventional Cardiologist   Pager # [phone] Phone # [phone] [address] [place], [place] [address]   Thank you for choosing Heart[facility] at [facility]!!

## 2019-03-14 DIAGNOSIS — S72144D Nondisplaced intertrochanteric fracture of right femur, subsequent encounter for closed fracture with routine healing: Secondary | ICD-10-CM | POA: Diagnosis not present

## 2019-03-14 DIAGNOSIS — E1151 Type 2 diabetes mellitus with diabetic peripheral angiopathy without gangrene: Secondary | ICD-10-CM | POA: Diagnosis not present

## 2019-03-14 DIAGNOSIS — N184 Chronic kidney disease, stage 4 (severe): Secondary | ICD-10-CM | POA: Diagnosis not present

## 2019-03-14 DIAGNOSIS — I5033 Acute on chronic diastolic (congestive) heart failure: Secondary | ICD-10-CM | POA: Diagnosis not present

## 2019-03-14 DIAGNOSIS — I130 Hypertensive heart and chronic kidney disease with heart failure and stage 1 through stage 4 chronic kidney disease, or unspecified chronic kidney disease: Secondary | ICD-10-CM | POA: Diagnosis not present

## 2019-03-14 DIAGNOSIS — Z471 Aftercare following joint replacement surgery: Secondary | ICD-10-CM | POA: Diagnosis not present

## 2019-03-14 DIAGNOSIS — Z7401 Bed confinement status: Secondary | ICD-10-CM | POA: Diagnosis not present

## 2019-03-14 DIAGNOSIS — Z794 Long term (current) use of insulin: Secondary | ICD-10-CM | POA: Diagnosis not present

## 2019-03-14 DIAGNOSIS — Z8673 Personal history of transient ischemic attack (TIA), and cerebral infarction without residual deficits: Secondary | ICD-10-CM | POA: Diagnosis not present

## 2019-03-14 DIAGNOSIS — E785 Hyperlipidemia, unspecified: Secondary | ICD-10-CM | POA: Diagnosis not present

## 2019-03-14 DIAGNOSIS — E1122 Type 2 diabetes mellitus with diabetic chronic kidney disease: Secondary | ICD-10-CM | POA: Diagnosis not present

## 2019-03-14 DIAGNOSIS — D631 Anemia in chronic kidney disease: Secondary | ICD-10-CM | POA: Diagnosis not present

## 2019-03-14 DIAGNOSIS — Z9181 History of falling: Secondary | ICD-10-CM | POA: Diagnosis not present

## 2019-03-14 DIAGNOSIS — I2510 Atherosclerotic heart disease of native coronary artery without angina pectoris: Secondary | ICD-10-CM | POA: Diagnosis not present

## 2019-03-14 DIAGNOSIS — I252 Old myocardial infarction: Secondary | ICD-10-CM | POA: Diagnosis not present

## 2019-03-15 ENCOUNTER — Encounter: Payer: Self-pay | Admitting: Cardiology

## 2019-03-15 NOTE — Assessment & Plan Note (Signed)
Renal function definitely got worse during her hospitalization.  ARB was discontinued.  Would not restart now unless blood pressure becomes significant elevated.  Also converted to torsemide for better bioavailability.

## 2019-03-15 NOTE — Assessment & Plan Note (Addendum)
No real active heart failure symptoms.  Stable edema.  Continue torsemide.  Tolerating low-dose Lopressor and Imdur.  Not on ARB any longer due to borderline hypotension.

## 2019-03-15 NOTE — Assessment & Plan Note (Signed)
Distant history of MI.  Mostly inferior and inferior lateral.  The presentation was with an acute CHF exacerbation.  She has been pretty much stable since PCI at that time.  Preserved EF by echo.  No recurrent angina or significant CHF.

## 2019-03-15 NOTE — Assessment & Plan Note (Signed)
Diffuse multivessel disease but no further angina or heart failure symptoms since her PCI of the LAD and circumflex.  She is not very active but is not having any angina symptoms. Plan: Continue statin, Imdur, beta-blocker and current maintenance dose of Brilinta.  No aspirin.  Okay to hold Brilinta for surgeries (5 to 7 days preop).  She is on maintenance dose of 60 mg twice daily.

## 2019-03-15 NOTE — Assessment & Plan Note (Signed)
Blood pressure actually is borderline low.  Now longer on ARB.  She is on stable dose of Imdur and metoprolol along with torsemide.  Was converted to torsemide for better responsiveness.

## 2019-03-15 NOTE — Assessment & Plan Note (Signed)
Labs have been pretty stable on atorvastatin.  Had been on fenofibrate.  No longer on them.  PCP/endocrinology have been following her labs and I do not have the lipid panel for this year.  Appears to be stable.

## 2019-03-17 DIAGNOSIS — Z794 Long term (current) use of insulin: Secondary | ICD-10-CM | POA: Diagnosis not present

## 2019-03-17 DIAGNOSIS — Z471 Aftercare following joint replacement surgery: Secondary | ICD-10-CM | POA: Diagnosis not present

## 2019-03-17 DIAGNOSIS — Z8673 Personal history of transient ischemic attack (TIA), and cerebral infarction without residual deficits: Secondary | ICD-10-CM | POA: Diagnosis not present

## 2019-03-17 DIAGNOSIS — I2510 Atherosclerotic heart disease of native coronary artery without angina pectoris: Secondary | ICD-10-CM | POA: Diagnosis not present

## 2019-03-17 DIAGNOSIS — I252 Old myocardial infarction: Secondary | ICD-10-CM | POA: Diagnosis not present

## 2019-03-17 DIAGNOSIS — E1122 Type 2 diabetes mellitus with diabetic chronic kidney disease: Secondary | ICD-10-CM | POA: Diagnosis not present

## 2019-03-17 DIAGNOSIS — I5033 Acute on chronic diastolic (congestive) heart failure: Secondary | ICD-10-CM | POA: Diagnosis not present

## 2019-03-17 DIAGNOSIS — E1151 Type 2 diabetes mellitus with diabetic peripheral angiopathy without gangrene: Secondary | ICD-10-CM | POA: Diagnosis not present

## 2019-03-17 DIAGNOSIS — E785 Hyperlipidemia, unspecified: Secondary | ICD-10-CM | POA: Diagnosis not present

## 2019-03-17 DIAGNOSIS — N184 Chronic kidney disease, stage 4 (severe): Secondary | ICD-10-CM | POA: Diagnosis not present

## 2019-03-17 DIAGNOSIS — I130 Hypertensive heart and chronic kidney disease with heart failure and stage 1 through stage 4 chronic kidney disease, or unspecified chronic kidney disease: Secondary | ICD-10-CM | POA: Diagnosis not present

## 2019-03-17 DIAGNOSIS — Z9181 History of falling: Secondary | ICD-10-CM | POA: Diagnosis not present

## 2019-03-17 DIAGNOSIS — S72144D Nondisplaced intertrochanteric fracture of right femur, subsequent encounter for closed fracture with routine healing: Secondary | ICD-10-CM | POA: Diagnosis not present

## 2019-03-17 DIAGNOSIS — Z7401 Bed confinement status: Secondary | ICD-10-CM | POA: Diagnosis not present

## 2019-03-17 DIAGNOSIS — D631 Anemia in chronic kidney disease: Secondary | ICD-10-CM | POA: Diagnosis not present

## 2019-03-18 DIAGNOSIS — Z8673 Personal history of transient ischemic attack (TIA), and cerebral infarction without residual deficits: Secondary | ICD-10-CM | POA: Diagnosis not present

## 2019-03-18 DIAGNOSIS — I130 Hypertensive heart and chronic kidney disease with heart failure and stage 1 through stage 4 chronic kidney disease, or unspecified chronic kidney disease: Secondary | ICD-10-CM | POA: Diagnosis not present

## 2019-03-18 DIAGNOSIS — I2510 Atherosclerotic heart disease of native coronary artery without angina pectoris: Secondary | ICD-10-CM | POA: Diagnosis not present

## 2019-03-18 DIAGNOSIS — I5033 Acute on chronic diastolic (congestive) heart failure: Secondary | ICD-10-CM | POA: Diagnosis not present

## 2019-03-18 DIAGNOSIS — Z9181 History of falling: Secondary | ICD-10-CM | POA: Diagnosis not present

## 2019-03-18 DIAGNOSIS — Z7401 Bed confinement status: Secondary | ICD-10-CM | POA: Diagnosis not present

## 2019-03-18 DIAGNOSIS — E1122 Type 2 diabetes mellitus with diabetic chronic kidney disease: Secondary | ICD-10-CM | POA: Diagnosis not present

## 2019-03-18 DIAGNOSIS — S72144D Nondisplaced intertrochanteric fracture of right femur, subsequent encounter for closed fracture with routine healing: Secondary | ICD-10-CM | POA: Diagnosis not present

## 2019-03-18 DIAGNOSIS — N184 Chronic kidney disease, stage 4 (severe): Secondary | ICD-10-CM | POA: Diagnosis not present

## 2019-03-18 DIAGNOSIS — Z471 Aftercare following joint replacement surgery: Secondary | ICD-10-CM | POA: Diagnosis not present

## 2019-03-18 DIAGNOSIS — Z794 Long term (current) use of insulin: Secondary | ICD-10-CM | POA: Diagnosis not present

## 2019-03-18 DIAGNOSIS — D631 Anemia in chronic kidney disease: Secondary | ICD-10-CM | POA: Diagnosis not present

## 2019-03-18 DIAGNOSIS — I252 Old myocardial infarction: Secondary | ICD-10-CM | POA: Diagnosis not present

## 2019-03-18 DIAGNOSIS — E1151 Type 2 diabetes mellitus with diabetic peripheral angiopathy without gangrene: Secondary | ICD-10-CM | POA: Diagnosis not present

## 2019-03-18 DIAGNOSIS — E785 Hyperlipidemia, unspecified: Secondary | ICD-10-CM | POA: Diagnosis not present

## 2019-03-19 DIAGNOSIS — Z794 Long term (current) use of insulin: Secondary | ICD-10-CM | POA: Diagnosis not present

## 2019-03-19 DIAGNOSIS — D631 Anemia in chronic kidney disease: Secondary | ICD-10-CM | POA: Diagnosis not present

## 2019-03-19 DIAGNOSIS — N184 Chronic kidney disease, stage 4 (severe): Secondary | ICD-10-CM | POA: Diagnosis not present

## 2019-03-19 DIAGNOSIS — I252 Old myocardial infarction: Secondary | ICD-10-CM | POA: Diagnosis not present

## 2019-03-19 DIAGNOSIS — Z8673 Personal history of transient ischemic attack (TIA), and cerebral infarction without residual deficits: Secondary | ICD-10-CM | POA: Diagnosis not present

## 2019-03-19 DIAGNOSIS — I5033 Acute on chronic diastolic (congestive) heart failure: Secondary | ICD-10-CM | POA: Diagnosis not present

## 2019-03-19 DIAGNOSIS — I130 Hypertensive heart and chronic kidney disease with heart failure and stage 1 through stage 4 chronic kidney disease, or unspecified chronic kidney disease: Secondary | ICD-10-CM | POA: Diagnosis not present

## 2019-03-19 DIAGNOSIS — S72144D Nondisplaced intertrochanteric fracture of right femur, subsequent encounter for closed fracture with routine healing: Secondary | ICD-10-CM | POA: Diagnosis not present

## 2019-03-19 DIAGNOSIS — E1122 Type 2 diabetes mellitus with diabetic chronic kidney disease: Secondary | ICD-10-CM | POA: Diagnosis not present

## 2019-03-19 DIAGNOSIS — E785 Hyperlipidemia, unspecified: Secondary | ICD-10-CM | POA: Diagnosis not present

## 2019-03-19 DIAGNOSIS — Z471 Aftercare following joint replacement surgery: Secondary | ICD-10-CM | POA: Diagnosis not present

## 2019-03-19 DIAGNOSIS — I2510 Atherosclerotic heart disease of native coronary artery without angina pectoris: Secondary | ICD-10-CM | POA: Diagnosis not present

## 2019-03-19 DIAGNOSIS — Z7401 Bed confinement status: Secondary | ICD-10-CM | POA: Diagnosis not present

## 2019-03-19 DIAGNOSIS — Z9181 History of falling: Secondary | ICD-10-CM | POA: Diagnosis not present

## 2019-03-19 DIAGNOSIS — E1151 Type 2 diabetes mellitus with diabetic peripheral angiopathy without gangrene: Secondary | ICD-10-CM | POA: Diagnosis not present

## 2019-03-20 ENCOUNTER — Other Ambulatory Visit: Payer: Self-pay | Admitting: Cardiology

## 2019-03-20 DIAGNOSIS — D631 Anemia in chronic kidney disease: Secondary | ICD-10-CM | POA: Diagnosis not present

## 2019-03-20 DIAGNOSIS — Z471 Aftercare following joint replacement surgery: Secondary | ICD-10-CM | POA: Diagnosis not present

## 2019-03-20 DIAGNOSIS — Z7401 Bed confinement status: Secondary | ICD-10-CM | POA: Diagnosis not present

## 2019-03-20 DIAGNOSIS — Z794 Long term (current) use of insulin: Secondary | ICD-10-CM | POA: Diagnosis not present

## 2019-03-20 DIAGNOSIS — E1151 Type 2 diabetes mellitus with diabetic peripheral angiopathy without gangrene: Secondary | ICD-10-CM | POA: Diagnosis not present

## 2019-03-20 DIAGNOSIS — I130 Hypertensive heart and chronic kidney disease with heart failure and stage 1 through stage 4 chronic kidney disease, or unspecified chronic kidney disease: Secondary | ICD-10-CM | POA: Diagnosis not present

## 2019-03-20 DIAGNOSIS — E1122 Type 2 diabetes mellitus with diabetic chronic kidney disease: Secondary | ICD-10-CM | POA: Diagnosis not present

## 2019-03-20 DIAGNOSIS — I5033 Acute on chronic diastolic (congestive) heart failure: Secondary | ICD-10-CM | POA: Diagnosis not present

## 2019-03-20 DIAGNOSIS — I2510 Atherosclerotic heart disease of native coronary artery without angina pectoris: Secondary | ICD-10-CM | POA: Diagnosis not present

## 2019-03-20 DIAGNOSIS — Z9181 History of falling: Secondary | ICD-10-CM | POA: Diagnosis not present

## 2019-03-20 DIAGNOSIS — Z8673 Personal history of transient ischemic attack (TIA), and cerebral infarction without residual deficits: Secondary | ICD-10-CM | POA: Diagnosis not present

## 2019-03-20 DIAGNOSIS — N184 Chronic kidney disease, stage 4 (severe): Secondary | ICD-10-CM | POA: Diagnosis not present

## 2019-03-20 DIAGNOSIS — I252 Old myocardial infarction: Secondary | ICD-10-CM | POA: Diagnosis not present

## 2019-03-20 DIAGNOSIS — E785 Hyperlipidemia, unspecified: Secondary | ICD-10-CM | POA: Diagnosis not present

## 2019-03-20 DIAGNOSIS — S72144D Nondisplaced intertrochanteric fracture of right femur, subsequent encounter for closed fracture with routine healing: Secondary | ICD-10-CM | POA: Diagnosis not present

## 2019-03-20 NOTE — Telephone Encounter (Signed)
Pharmacy is requesting Brilinta 90MG  tablets.  This is the dose per patients medication.  Per last office note it states "She is on maintenance dose of 60 mg twice daily."  I just want to verify which dose patient should be on.   Thank you!

## 2019-03-22 DIAGNOSIS — S72009A Fracture of unspecified part of neck of unspecified femur, initial encounter for closed fracture: Secondary | ICD-10-CM | POA: Diagnosis not present

## 2019-03-22 DIAGNOSIS — I5033 Acute on chronic diastolic (congestive) heart failure: Secondary | ICD-10-CM | POA: Diagnosis not present

## 2019-03-23 DIAGNOSIS — Z8673 Personal history of transient ischemic attack (TIA), and cerebral infarction without residual deficits: Secondary | ICD-10-CM | POA: Diagnosis not present

## 2019-03-23 DIAGNOSIS — I5033 Acute on chronic diastolic (congestive) heart failure: Secondary | ICD-10-CM | POA: Diagnosis not present

## 2019-03-23 DIAGNOSIS — E785 Hyperlipidemia, unspecified: Secondary | ICD-10-CM | POA: Diagnosis not present

## 2019-03-23 DIAGNOSIS — I252 Old myocardial infarction: Secondary | ICD-10-CM | POA: Diagnosis not present

## 2019-03-23 DIAGNOSIS — S72144D Nondisplaced intertrochanteric fracture of right femur, subsequent encounter for closed fracture with routine healing: Secondary | ICD-10-CM | POA: Diagnosis not present

## 2019-03-23 DIAGNOSIS — Z794 Long term (current) use of insulin: Secondary | ICD-10-CM | POA: Diagnosis not present

## 2019-03-23 DIAGNOSIS — E1122 Type 2 diabetes mellitus with diabetic chronic kidney disease: Secondary | ICD-10-CM | POA: Diagnosis not present

## 2019-03-23 DIAGNOSIS — Z9181 History of falling: Secondary | ICD-10-CM | POA: Diagnosis not present

## 2019-03-23 DIAGNOSIS — I2510 Atherosclerotic heart disease of native coronary artery without angina pectoris: Secondary | ICD-10-CM | POA: Diagnosis not present

## 2019-03-23 DIAGNOSIS — Z7401 Bed confinement status: Secondary | ICD-10-CM | POA: Diagnosis not present

## 2019-03-23 DIAGNOSIS — Z471 Aftercare following joint replacement surgery: Secondary | ICD-10-CM | POA: Diagnosis not present

## 2019-03-23 DIAGNOSIS — D631 Anemia in chronic kidney disease: Secondary | ICD-10-CM | POA: Diagnosis not present

## 2019-03-23 DIAGNOSIS — E1151 Type 2 diabetes mellitus with diabetic peripheral angiopathy without gangrene: Secondary | ICD-10-CM | POA: Diagnosis not present

## 2019-03-23 DIAGNOSIS — N184 Chronic kidney disease, stage 4 (severe): Secondary | ICD-10-CM | POA: Diagnosis not present

## 2019-03-23 DIAGNOSIS — I130 Hypertensive heart and chronic kidney disease with heart failure and stage 1 through stage 4 chronic kidney disease, or unspecified chronic kidney disease: Secondary | ICD-10-CM | POA: Diagnosis not present

## 2019-03-25 DIAGNOSIS — Z7401 Bed confinement status: Secondary | ICD-10-CM | POA: Diagnosis not present

## 2019-03-25 DIAGNOSIS — Z9181 History of falling: Secondary | ICD-10-CM | POA: Diagnosis not present

## 2019-03-25 DIAGNOSIS — I252 Old myocardial infarction: Secondary | ICD-10-CM | POA: Diagnosis not present

## 2019-03-25 DIAGNOSIS — S72144D Nondisplaced intertrochanteric fracture of right femur, subsequent encounter for closed fracture with routine healing: Secondary | ICD-10-CM | POA: Diagnosis not present

## 2019-03-25 DIAGNOSIS — D631 Anemia in chronic kidney disease: Secondary | ICD-10-CM | POA: Diagnosis not present

## 2019-03-25 DIAGNOSIS — N184 Chronic kidney disease, stage 4 (severe): Secondary | ICD-10-CM | POA: Diagnosis not present

## 2019-03-25 DIAGNOSIS — Z8673 Personal history of transient ischemic attack (TIA), and cerebral infarction without residual deficits: Secondary | ICD-10-CM | POA: Diagnosis not present

## 2019-03-25 DIAGNOSIS — I130 Hypertensive heart and chronic kidney disease with heart failure and stage 1 through stage 4 chronic kidney disease, or unspecified chronic kidney disease: Secondary | ICD-10-CM | POA: Diagnosis not present

## 2019-03-25 DIAGNOSIS — I2510 Atherosclerotic heart disease of native coronary artery without angina pectoris: Secondary | ICD-10-CM | POA: Diagnosis not present

## 2019-03-25 DIAGNOSIS — E785 Hyperlipidemia, unspecified: Secondary | ICD-10-CM | POA: Diagnosis not present

## 2019-03-25 DIAGNOSIS — I5033 Acute on chronic diastolic (congestive) heart failure: Secondary | ICD-10-CM | POA: Diagnosis not present

## 2019-03-25 DIAGNOSIS — E1122 Type 2 diabetes mellitus with diabetic chronic kidney disease: Secondary | ICD-10-CM | POA: Diagnosis not present

## 2019-03-25 DIAGNOSIS — E1151 Type 2 diabetes mellitus with diabetic peripheral angiopathy without gangrene: Secondary | ICD-10-CM | POA: Diagnosis not present

## 2019-03-25 DIAGNOSIS — Z794 Long term (current) use of insulin: Secondary | ICD-10-CM | POA: Diagnosis not present

## 2019-03-25 DIAGNOSIS — Z471 Aftercare following joint replacement surgery: Secondary | ICD-10-CM | POA: Diagnosis not present

## 2019-03-27 DIAGNOSIS — D631 Anemia in chronic kidney disease: Secondary | ICD-10-CM | POA: Diagnosis not present

## 2019-03-27 DIAGNOSIS — E1122 Type 2 diabetes mellitus with diabetic chronic kidney disease: Secondary | ICD-10-CM | POA: Diagnosis not present

## 2019-03-27 DIAGNOSIS — S72144D Nondisplaced intertrochanteric fracture of right femur, subsequent encounter for closed fracture with routine healing: Secondary | ICD-10-CM | POA: Diagnosis not present

## 2019-03-27 DIAGNOSIS — Z8673 Personal history of transient ischemic attack (TIA), and cerebral infarction without residual deficits: Secondary | ICD-10-CM | POA: Diagnosis not present

## 2019-03-27 DIAGNOSIS — I130 Hypertensive heart and chronic kidney disease with heart failure and stage 1 through stage 4 chronic kidney disease, or unspecified chronic kidney disease: Secondary | ICD-10-CM | POA: Diagnosis not present

## 2019-03-27 DIAGNOSIS — N184 Chronic kidney disease, stage 4 (severe): Secondary | ICD-10-CM | POA: Diagnosis not present

## 2019-03-27 DIAGNOSIS — E785 Hyperlipidemia, unspecified: Secondary | ICD-10-CM | POA: Diagnosis not present

## 2019-03-27 DIAGNOSIS — Z471 Aftercare following joint replacement surgery: Secondary | ICD-10-CM | POA: Diagnosis not present

## 2019-03-27 DIAGNOSIS — Z9181 History of falling: Secondary | ICD-10-CM | POA: Diagnosis not present

## 2019-03-27 DIAGNOSIS — Z794 Long term (current) use of insulin: Secondary | ICD-10-CM | POA: Diagnosis not present

## 2019-03-27 DIAGNOSIS — E1151 Type 2 diabetes mellitus with diabetic peripheral angiopathy without gangrene: Secondary | ICD-10-CM | POA: Diagnosis not present

## 2019-03-27 DIAGNOSIS — I5033 Acute on chronic diastolic (congestive) heart failure: Secondary | ICD-10-CM | POA: Diagnosis not present

## 2019-03-27 DIAGNOSIS — I252 Old myocardial infarction: Secondary | ICD-10-CM | POA: Diagnosis not present

## 2019-03-27 DIAGNOSIS — Z7401 Bed confinement status: Secondary | ICD-10-CM | POA: Diagnosis not present

## 2019-03-27 DIAGNOSIS — I2510 Atherosclerotic heart disease of native coronary artery without angina pectoris: Secondary | ICD-10-CM | POA: Diagnosis not present

## 2019-03-27 NOTE — Telephone Encounter (Signed)
She is supposed be on a maintenance dose of 60 mg not 90.  Glenetta Hew, MD

## 2019-03-28 ENCOUNTER — Other Ambulatory Visit: Payer: Self-pay | Admitting: Cardiology

## 2019-03-30 DIAGNOSIS — Z7401 Bed confinement status: Secondary | ICD-10-CM | POA: Diagnosis not present

## 2019-03-30 DIAGNOSIS — E1151 Type 2 diabetes mellitus with diabetic peripheral angiopathy without gangrene: Secondary | ICD-10-CM | POA: Diagnosis not present

## 2019-03-30 DIAGNOSIS — Z8673 Personal history of transient ischemic attack (TIA), and cerebral infarction without residual deficits: Secondary | ICD-10-CM | POA: Diagnosis not present

## 2019-03-30 DIAGNOSIS — E785 Hyperlipidemia, unspecified: Secondary | ICD-10-CM | POA: Diagnosis not present

## 2019-03-30 DIAGNOSIS — Z9181 History of falling: Secondary | ICD-10-CM | POA: Diagnosis not present

## 2019-03-30 DIAGNOSIS — Z471 Aftercare following joint replacement surgery: Secondary | ICD-10-CM | POA: Diagnosis not present

## 2019-03-30 DIAGNOSIS — S72144D Nondisplaced intertrochanteric fracture of right femur, subsequent encounter for closed fracture with routine healing: Secondary | ICD-10-CM | POA: Diagnosis not present

## 2019-03-30 DIAGNOSIS — I252 Old myocardial infarction: Secondary | ICD-10-CM | POA: Diagnosis not present

## 2019-03-30 DIAGNOSIS — D631 Anemia in chronic kidney disease: Secondary | ICD-10-CM | POA: Diagnosis not present

## 2019-03-30 DIAGNOSIS — I2510 Atherosclerotic heart disease of native coronary artery without angina pectoris: Secondary | ICD-10-CM | POA: Diagnosis not present

## 2019-03-30 DIAGNOSIS — Z794 Long term (current) use of insulin: Secondary | ICD-10-CM | POA: Diagnosis not present

## 2019-03-30 DIAGNOSIS — N184 Chronic kidney disease, stage 4 (severe): Secondary | ICD-10-CM | POA: Diagnosis not present

## 2019-03-30 DIAGNOSIS — E1122 Type 2 diabetes mellitus with diabetic chronic kidney disease: Secondary | ICD-10-CM | POA: Diagnosis not present

## 2019-03-30 DIAGNOSIS — I130 Hypertensive heart and chronic kidney disease with heart failure and stage 1 through stage 4 chronic kidney disease, or unspecified chronic kidney disease: Secondary | ICD-10-CM | POA: Diagnosis not present

## 2019-03-30 DIAGNOSIS — I5033 Acute on chronic diastolic (congestive) heart failure: Secondary | ICD-10-CM | POA: Diagnosis not present

## 2019-03-30 NOTE — Telephone Encounter (Signed)
Patient is now on maintenance dose (60 mg BID). Requested medication discontinued. Rx refused.

## 2019-03-31 DIAGNOSIS — Z8673 Personal history of transient ischemic attack (TIA), and cerebral infarction without residual deficits: Secondary | ICD-10-CM | POA: Diagnosis not present

## 2019-03-31 DIAGNOSIS — E1151 Type 2 diabetes mellitus with diabetic peripheral angiopathy without gangrene: Secondary | ICD-10-CM | POA: Diagnosis not present

## 2019-03-31 DIAGNOSIS — Z471 Aftercare following joint replacement surgery: Secondary | ICD-10-CM | POA: Diagnosis not present

## 2019-03-31 DIAGNOSIS — I252 Old myocardial infarction: Secondary | ICD-10-CM | POA: Diagnosis not present

## 2019-03-31 DIAGNOSIS — S72144D Nondisplaced intertrochanteric fracture of right femur, subsequent encounter for closed fracture with routine healing: Secondary | ICD-10-CM | POA: Diagnosis not present

## 2019-03-31 DIAGNOSIS — E785 Hyperlipidemia, unspecified: Secondary | ICD-10-CM | POA: Diagnosis not present

## 2019-03-31 DIAGNOSIS — D631 Anemia in chronic kidney disease: Secondary | ICD-10-CM | POA: Diagnosis not present

## 2019-03-31 DIAGNOSIS — E1122 Type 2 diabetes mellitus with diabetic chronic kidney disease: Secondary | ICD-10-CM | POA: Diagnosis not present

## 2019-03-31 DIAGNOSIS — I5033 Acute on chronic diastolic (congestive) heart failure: Secondary | ICD-10-CM | POA: Diagnosis not present

## 2019-03-31 DIAGNOSIS — Z9181 History of falling: Secondary | ICD-10-CM | POA: Diagnosis not present

## 2019-03-31 DIAGNOSIS — I130 Hypertensive heart and chronic kidney disease with heart failure and stage 1 through stage 4 chronic kidney disease, or unspecified chronic kidney disease: Secondary | ICD-10-CM | POA: Diagnosis not present

## 2019-03-31 DIAGNOSIS — N184 Chronic kidney disease, stage 4 (severe): Secondary | ICD-10-CM | POA: Diagnosis not present

## 2019-03-31 DIAGNOSIS — Z7401 Bed confinement status: Secondary | ICD-10-CM | POA: Diagnosis not present

## 2019-03-31 DIAGNOSIS — Z794 Long term (current) use of insulin: Secondary | ICD-10-CM | POA: Diagnosis not present

## 2019-03-31 DIAGNOSIS — I2510 Atherosclerotic heart disease of native coronary artery without angina pectoris: Secondary | ICD-10-CM | POA: Diagnosis not present

## 2019-04-02 DIAGNOSIS — Z9181 History of falling: Secondary | ICD-10-CM | POA: Diagnosis not present

## 2019-04-02 DIAGNOSIS — Z7401 Bed confinement status: Secondary | ICD-10-CM | POA: Diagnosis not present

## 2019-04-02 DIAGNOSIS — N184 Chronic kidney disease, stage 4 (severe): Secondary | ICD-10-CM | POA: Diagnosis not present

## 2019-04-02 DIAGNOSIS — E1122 Type 2 diabetes mellitus with diabetic chronic kidney disease: Secondary | ICD-10-CM | POA: Diagnosis not present

## 2019-04-02 DIAGNOSIS — Z8673 Personal history of transient ischemic attack (TIA), and cerebral infarction without residual deficits: Secondary | ICD-10-CM | POA: Diagnosis not present

## 2019-04-02 DIAGNOSIS — S72144D Nondisplaced intertrochanteric fracture of right femur, subsequent encounter for closed fracture with routine healing: Secondary | ICD-10-CM | POA: Diagnosis not present

## 2019-04-02 DIAGNOSIS — I5033 Acute on chronic diastolic (congestive) heart failure: Secondary | ICD-10-CM | POA: Diagnosis not present

## 2019-04-02 DIAGNOSIS — E785 Hyperlipidemia, unspecified: Secondary | ICD-10-CM | POA: Diagnosis not present

## 2019-04-02 DIAGNOSIS — E1151 Type 2 diabetes mellitus with diabetic peripheral angiopathy without gangrene: Secondary | ICD-10-CM | POA: Diagnosis not present

## 2019-04-02 DIAGNOSIS — D631 Anemia in chronic kidney disease: Secondary | ICD-10-CM | POA: Diagnosis not present

## 2019-04-02 DIAGNOSIS — I2510 Atherosclerotic heart disease of native coronary artery without angina pectoris: Secondary | ICD-10-CM | POA: Diagnosis not present

## 2019-04-02 DIAGNOSIS — Z794 Long term (current) use of insulin: Secondary | ICD-10-CM | POA: Diagnosis not present

## 2019-04-02 DIAGNOSIS — I252 Old myocardial infarction: Secondary | ICD-10-CM | POA: Diagnosis not present

## 2019-04-02 DIAGNOSIS — Z471 Aftercare following joint replacement surgery: Secondary | ICD-10-CM | POA: Diagnosis not present

## 2019-04-02 DIAGNOSIS — I130 Hypertensive heart and chronic kidney disease with heart failure and stage 1 through stage 4 chronic kidney disease, or unspecified chronic kidney disease: Secondary | ICD-10-CM | POA: Diagnosis not present

## 2019-04-06 DIAGNOSIS — S72141D Displaced intertrochanteric fracture of right femur, subsequent encounter for closed fracture with routine healing: Secondary | ICD-10-CM | POA: Diagnosis not present

## 2019-04-06 DIAGNOSIS — I5033 Acute on chronic diastolic (congestive) heart failure: Secondary | ICD-10-CM | POA: Diagnosis not present

## 2019-04-06 DIAGNOSIS — E785 Hyperlipidemia, unspecified: Secondary | ICD-10-CM | POA: Diagnosis not present

## 2019-04-06 DIAGNOSIS — Z471 Aftercare following joint replacement surgery: Secondary | ICD-10-CM | POA: Diagnosis not present

## 2019-04-06 DIAGNOSIS — E1151 Type 2 diabetes mellitus with diabetic peripheral angiopathy without gangrene: Secondary | ICD-10-CM | POA: Diagnosis not present

## 2019-04-06 DIAGNOSIS — Z9181 History of falling: Secondary | ICD-10-CM | POA: Diagnosis not present

## 2019-04-06 DIAGNOSIS — N184 Chronic kidney disease, stage 4 (severe): Secondary | ICD-10-CM | POA: Diagnosis not present

## 2019-04-06 DIAGNOSIS — Z7401 Bed confinement status: Secondary | ICD-10-CM | POA: Diagnosis not present

## 2019-04-06 DIAGNOSIS — Z794 Long term (current) use of insulin: Secondary | ICD-10-CM | POA: Diagnosis not present

## 2019-04-06 DIAGNOSIS — I2510 Atherosclerotic heart disease of native coronary artery without angina pectoris: Secondary | ICD-10-CM | POA: Diagnosis not present

## 2019-04-06 DIAGNOSIS — E1122 Type 2 diabetes mellitus with diabetic chronic kidney disease: Secondary | ICD-10-CM | POA: Diagnosis not present

## 2019-04-06 DIAGNOSIS — S72144D Nondisplaced intertrochanteric fracture of right femur, subsequent encounter for closed fracture with routine healing: Secondary | ICD-10-CM | POA: Diagnosis not present

## 2019-04-06 DIAGNOSIS — I130 Hypertensive heart and chronic kidney disease with heart failure and stage 1 through stage 4 chronic kidney disease, or unspecified chronic kidney disease: Secondary | ICD-10-CM | POA: Diagnosis not present

## 2019-04-06 DIAGNOSIS — Z8673 Personal history of transient ischemic attack (TIA), and cerebral infarction without residual deficits: Secondary | ICD-10-CM | POA: Diagnosis not present

## 2019-04-06 DIAGNOSIS — I252 Old myocardial infarction: Secondary | ICD-10-CM | POA: Diagnosis not present

## 2019-04-06 DIAGNOSIS — D631 Anemia in chronic kidney disease: Secondary | ICD-10-CM | POA: Diagnosis not present

## 2019-04-09 DIAGNOSIS — E785 Hyperlipidemia, unspecified: Secondary | ICD-10-CM | POA: Diagnosis not present

## 2019-04-09 DIAGNOSIS — Z8673 Personal history of transient ischemic attack (TIA), and cerebral infarction without residual deficits: Secondary | ICD-10-CM | POA: Diagnosis not present

## 2019-04-09 DIAGNOSIS — E118 Type 2 diabetes mellitus with unspecified complications: Secondary | ICD-10-CM | POA: Diagnosis not present

## 2019-04-09 DIAGNOSIS — Z9181 History of falling: Secondary | ICD-10-CM | POA: Diagnosis not present

## 2019-04-09 DIAGNOSIS — Z7401 Bed confinement status: Secondary | ICD-10-CM | POA: Diagnosis not present

## 2019-04-09 DIAGNOSIS — I130 Hypertensive heart and chronic kidney disease with heart failure and stage 1 through stage 4 chronic kidney disease, or unspecified chronic kidney disease: Secondary | ICD-10-CM | POA: Diagnosis not present

## 2019-04-09 DIAGNOSIS — E1122 Type 2 diabetes mellitus with diabetic chronic kidney disease: Secondary | ICD-10-CM | POA: Diagnosis not present

## 2019-04-09 DIAGNOSIS — I2510 Atherosclerotic heart disease of native coronary artery without angina pectoris: Secondary | ICD-10-CM | POA: Diagnosis not present

## 2019-04-09 DIAGNOSIS — I10 Essential (primary) hypertension: Secondary | ICD-10-CM | POA: Diagnosis not present

## 2019-04-09 DIAGNOSIS — D631 Anemia in chronic kidney disease: Secondary | ICD-10-CM | POA: Diagnosis not present

## 2019-04-09 DIAGNOSIS — Z794 Long term (current) use of insulin: Secondary | ICD-10-CM | POA: Diagnosis not present

## 2019-04-09 DIAGNOSIS — S72144D Nondisplaced intertrochanteric fracture of right femur, subsequent encounter for closed fracture with routine healing: Secondary | ICD-10-CM | POA: Diagnosis not present

## 2019-04-09 DIAGNOSIS — E1151 Type 2 diabetes mellitus with diabetic peripheral angiopathy without gangrene: Secondary | ICD-10-CM | POA: Diagnosis not present

## 2019-04-09 DIAGNOSIS — Z471 Aftercare following joint replacement surgery: Secondary | ICD-10-CM | POA: Diagnosis not present

## 2019-04-09 DIAGNOSIS — I5033 Acute on chronic diastolic (congestive) heart failure: Secondary | ICD-10-CM | POA: Diagnosis not present

## 2019-04-09 DIAGNOSIS — N184 Chronic kidney disease, stage 4 (severe): Secondary | ICD-10-CM | POA: Diagnosis not present

## 2019-04-09 DIAGNOSIS — I252 Old myocardial infarction: Secondary | ICD-10-CM | POA: Diagnosis not present

## 2019-04-16 ENCOUNTER — Other Ambulatory Visit: Payer: Self-pay | Admitting: Cardiology

## 2019-04-17 DIAGNOSIS — Z471 Aftercare following joint replacement surgery: Secondary | ICD-10-CM | POA: Diagnosis not present

## 2019-04-17 DIAGNOSIS — Z9181 History of falling: Secondary | ICD-10-CM | POA: Diagnosis not present

## 2019-04-17 DIAGNOSIS — I5033 Acute on chronic diastolic (congestive) heart failure: Secondary | ICD-10-CM | POA: Diagnosis not present

## 2019-04-17 DIAGNOSIS — Z794 Long term (current) use of insulin: Secondary | ICD-10-CM | POA: Diagnosis not present

## 2019-04-17 DIAGNOSIS — D631 Anemia in chronic kidney disease: Secondary | ICD-10-CM | POA: Diagnosis not present

## 2019-04-17 DIAGNOSIS — I2510 Atherosclerotic heart disease of native coronary artery without angina pectoris: Secondary | ICD-10-CM | POA: Diagnosis not present

## 2019-04-17 DIAGNOSIS — N184 Chronic kidney disease, stage 4 (severe): Secondary | ICD-10-CM | POA: Diagnosis not present

## 2019-04-17 DIAGNOSIS — E1151 Type 2 diabetes mellitus with diabetic peripheral angiopathy without gangrene: Secondary | ICD-10-CM | POA: Diagnosis not present

## 2019-04-17 DIAGNOSIS — Z8673 Personal history of transient ischemic attack (TIA), and cerebral infarction without residual deficits: Secondary | ICD-10-CM | POA: Diagnosis not present

## 2019-04-17 DIAGNOSIS — Z7401 Bed confinement status: Secondary | ICD-10-CM | POA: Diagnosis not present

## 2019-04-17 DIAGNOSIS — I252 Old myocardial infarction: Secondary | ICD-10-CM | POA: Diagnosis not present

## 2019-04-17 DIAGNOSIS — E785 Hyperlipidemia, unspecified: Secondary | ICD-10-CM | POA: Diagnosis not present

## 2019-04-17 DIAGNOSIS — S72144D Nondisplaced intertrochanteric fracture of right femur, subsequent encounter for closed fracture with routine healing: Secondary | ICD-10-CM | POA: Diagnosis not present

## 2019-04-17 DIAGNOSIS — I130 Hypertensive heart and chronic kidney disease with heart failure and stage 1 through stage 4 chronic kidney disease, or unspecified chronic kidney disease: Secondary | ICD-10-CM | POA: Diagnosis not present

## 2019-04-17 DIAGNOSIS — E1122 Type 2 diabetes mellitus with diabetic chronic kidney disease: Secondary | ICD-10-CM | POA: Diagnosis not present

## 2019-04-21 DIAGNOSIS — D631 Anemia in chronic kidney disease: Secondary | ICD-10-CM | POA: Diagnosis not present

## 2019-04-21 DIAGNOSIS — Z9181 History of falling: Secondary | ICD-10-CM | POA: Diagnosis not present

## 2019-04-21 DIAGNOSIS — Z7401 Bed confinement status: Secondary | ICD-10-CM | POA: Diagnosis not present

## 2019-04-21 DIAGNOSIS — S72144D Nondisplaced intertrochanteric fracture of right femur, subsequent encounter for closed fracture with routine healing: Secondary | ICD-10-CM | POA: Diagnosis not present

## 2019-04-21 DIAGNOSIS — I130 Hypertensive heart and chronic kidney disease with heart failure and stage 1 through stage 4 chronic kidney disease, or unspecified chronic kidney disease: Secondary | ICD-10-CM | POA: Diagnosis not present

## 2019-04-21 DIAGNOSIS — E1151 Type 2 diabetes mellitus with diabetic peripheral angiopathy without gangrene: Secondary | ICD-10-CM | POA: Diagnosis not present

## 2019-04-21 DIAGNOSIS — I252 Old myocardial infarction: Secondary | ICD-10-CM | POA: Diagnosis not present

## 2019-04-21 DIAGNOSIS — N184 Chronic kidney disease, stage 4 (severe): Secondary | ICD-10-CM | POA: Diagnosis not present

## 2019-04-21 DIAGNOSIS — Z794 Long term (current) use of insulin: Secondary | ICD-10-CM | POA: Diagnosis not present

## 2019-04-21 DIAGNOSIS — I5033 Acute on chronic diastolic (congestive) heart failure: Secondary | ICD-10-CM | POA: Diagnosis not present

## 2019-04-21 DIAGNOSIS — Z8673 Personal history of transient ischemic attack (TIA), and cerebral infarction without residual deficits: Secondary | ICD-10-CM | POA: Diagnosis not present

## 2019-04-21 DIAGNOSIS — E785 Hyperlipidemia, unspecified: Secondary | ICD-10-CM | POA: Diagnosis not present

## 2019-04-21 DIAGNOSIS — Z471 Aftercare following joint replacement surgery: Secondary | ICD-10-CM | POA: Diagnosis not present

## 2019-04-21 DIAGNOSIS — E1122 Type 2 diabetes mellitus with diabetic chronic kidney disease: Secondary | ICD-10-CM | POA: Diagnosis not present

## 2019-04-21 DIAGNOSIS — I2510 Atherosclerotic heart disease of native coronary artery without angina pectoris: Secondary | ICD-10-CM | POA: Diagnosis not present

## 2019-04-22 DIAGNOSIS — S72009A Fracture of unspecified part of neck of unspecified femur, initial encounter for closed fracture: Secondary | ICD-10-CM | POA: Diagnosis not present

## 2019-04-22 DIAGNOSIS — I5033 Acute on chronic diastolic (congestive) heart failure: Secondary | ICD-10-CM | POA: Diagnosis not present

## 2019-04-23 DIAGNOSIS — Z8673 Personal history of transient ischemic attack (TIA), and cerebral infarction without residual deficits: Secondary | ICD-10-CM | POA: Diagnosis not present

## 2019-04-23 DIAGNOSIS — Z794 Long term (current) use of insulin: Secondary | ICD-10-CM | POA: Diagnosis not present

## 2019-04-23 DIAGNOSIS — E1122 Type 2 diabetes mellitus with diabetic chronic kidney disease: Secondary | ICD-10-CM | POA: Diagnosis not present

## 2019-04-23 DIAGNOSIS — I2510 Atherosclerotic heart disease of native coronary artery without angina pectoris: Secondary | ICD-10-CM | POA: Diagnosis not present

## 2019-04-23 DIAGNOSIS — I252 Old myocardial infarction: Secondary | ICD-10-CM | POA: Diagnosis not present

## 2019-04-23 DIAGNOSIS — D631 Anemia in chronic kidney disease: Secondary | ICD-10-CM | POA: Diagnosis not present

## 2019-04-23 DIAGNOSIS — N184 Chronic kidney disease, stage 4 (severe): Secondary | ICD-10-CM | POA: Diagnosis not present

## 2019-04-23 DIAGNOSIS — I5033 Acute on chronic diastolic (congestive) heart failure: Secondary | ICD-10-CM | POA: Diagnosis not present

## 2019-04-23 DIAGNOSIS — Z9181 History of falling: Secondary | ICD-10-CM | POA: Diagnosis not present

## 2019-04-23 DIAGNOSIS — I130 Hypertensive heart and chronic kidney disease with heart failure and stage 1 through stage 4 chronic kidney disease, or unspecified chronic kidney disease: Secondary | ICD-10-CM | POA: Diagnosis not present

## 2019-04-23 DIAGNOSIS — E785 Hyperlipidemia, unspecified: Secondary | ICD-10-CM | POA: Diagnosis not present

## 2019-04-23 DIAGNOSIS — Z7401 Bed confinement status: Secondary | ICD-10-CM | POA: Diagnosis not present

## 2019-04-23 DIAGNOSIS — Z471 Aftercare following joint replacement surgery: Secondary | ICD-10-CM | POA: Diagnosis not present

## 2019-04-23 DIAGNOSIS — S72144D Nondisplaced intertrochanteric fracture of right femur, subsequent encounter for closed fracture with routine healing: Secondary | ICD-10-CM | POA: Diagnosis not present

## 2019-04-23 DIAGNOSIS — E1151 Type 2 diabetes mellitus with diabetic peripheral angiopathy without gangrene: Secondary | ICD-10-CM | POA: Diagnosis not present

## 2019-05-12 DIAGNOSIS — S72141D Displaced intertrochanteric fracture of right femur, subsequent encounter for closed fracture with routine healing: Secondary | ICD-10-CM | POA: Diagnosis not present

## 2019-05-23 DIAGNOSIS — S72009A Fracture of unspecified part of neck of unspecified femur, initial encounter for closed fracture: Secondary | ICD-10-CM | POA: Diagnosis not present

## 2019-05-23 DIAGNOSIS — I5033 Acute on chronic diastolic (congestive) heart failure: Secondary | ICD-10-CM | POA: Diagnosis not present

## 2019-06-04 DIAGNOSIS — M810 Age-related osteoporosis without current pathological fracture: Secondary | ICD-10-CM | POA: Diagnosis not present

## 2019-06-09 DIAGNOSIS — S72141D Displaced intertrochanteric fracture of right femur, subsequent encounter for closed fracture with routine healing: Secondary | ICD-10-CM | POA: Diagnosis not present

## 2019-06-20 DIAGNOSIS — S72009A Fracture of unspecified part of neck of unspecified femur, initial encounter for closed fracture: Secondary | ICD-10-CM | POA: Diagnosis not present

## 2019-06-20 DIAGNOSIS — I5033 Acute on chronic diastolic (congestive) heart failure: Secondary | ICD-10-CM | POA: Diagnosis not present

## 2019-06-23 DIAGNOSIS — R000 Tachycardia, unspecified: Secondary | ICD-10-CM | POA: Diagnosis not present

## 2019-06-23 DIAGNOSIS — E161 Other hypoglycemia: Secondary | ICD-10-CM | POA: Diagnosis not present

## 2019-06-23 DIAGNOSIS — R404 Transient alteration of awareness: Secondary | ICD-10-CM | POA: Diagnosis not present

## 2019-06-23 DIAGNOSIS — E162 Hypoglycemia, unspecified: Secondary | ICD-10-CM | POA: Diagnosis not present

## 2019-06-23 DIAGNOSIS — I959 Hypotension, unspecified: Secondary | ICD-10-CM | POA: Diagnosis not present

## 2019-07-01 ENCOUNTER — Other Ambulatory Visit: Payer: Self-pay | Admitting: Cardiology

## 2019-07-07 ENCOUNTER — Other Ambulatory Visit: Payer: Self-pay | Admitting: Cardiology

## 2019-07-13 DIAGNOSIS — E039 Hypothyroidism, unspecified: Secondary | ICD-10-CM | POA: Diagnosis not present

## 2019-07-13 DIAGNOSIS — E1165 Type 2 diabetes mellitus with hyperglycemia: Secondary | ICD-10-CM | POA: Diagnosis not present

## 2019-07-13 DIAGNOSIS — N189 Chronic kidney disease, unspecified: Secondary | ICD-10-CM | POA: Diagnosis not present

## 2019-07-13 DIAGNOSIS — I10 Essential (primary) hypertension: Secondary | ICD-10-CM | POA: Diagnosis not present

## 2019-07-21 DIAGNOSIS — I5033 Acute on chronic diastolic (congestive) heart failure: Secondary | ICD-10-CM | POA: Diagnosis not present

## 2019-07-21 DIAGNOSIS — S72009A Fracture of unspecified part of neck of unspecified femur, initial encounter for closed fracture: Secondary | ICD-10-CM | POA: Diagnosis not present

## 2019-08-17 DIAGNOSIS — S72141D Displaced intertrochanteric fracture of right femur, subsequent encounter for closed fracture with routine healing: Secondary | ICD-10-CM | POA: Diagnosis not present

## 2019-08-20 DIAGNOSIS — S72009A Fracture of unspecified part of neck of unspecified femur, initial encounter for closed fracture: Secondary | ICD-10-CM | POA: Diagnosis not present

## 2019-08-20 DIAGNOSIS — I5033 Acute on chronic diastolic (congestive) heart failure: Secondary | ICD-10-CM | POA: Diagnosis not present

## 2019-09-04 DIAGNOSIS — G894 Chronic pain syndrome: Secondary | ICD-10-CM | POA: Diagnosis not present

## 2019-09-15 ENCOUNTER — Other Ambulatory Visit: Payer: Self-pay | Admitting: Cardiology

## 2019-09-16 NOTE — Telephone Encounter (Signed)
PATIENT  SHOULD HAVE REFILL THROUGH PRIMARY DOCTOR - Dr Hemphill

## 2019-09-20 DIAGNOSIS — S72009A Fracture of unspecified part of neck of unspecified femur, initial encounter for closed fracture: Secondary | ICD-10-CM | POA: Diagnosis not present

## 2019-09-20 DIAGNOSIS — I5033 Acute on chronic diastolic (congestive) heart failure: Secondary | ICD-10-CM | POA: Diagnosis not present

## 2019-09-21 ENCOUNTER — Other Ambulatory Visit: Payer: Self-pay | Admitting: Cardiology

## 2019-09-21 NOTE — Telephone Encounter (Signed)
*  STAT* If patient is at the pharmacy, call can be transferred to refill team.   1. Which medications need to be refilled? (please list name of each medication and dose if known)   POLY-IRON 150 150 MG capsule    2. Which pharmacy/location (including street and city if local pharmacy) is medication to be sent to? [place], [place] - [address] AT [place]  3. Do they need a 30 day or 90 day supply? [place]

## 2019-09-22 MED ORDER — POLYSACCHARIDE IRON COMPLEX 150 MG PO CAPS: 150.0000 mg | ORAL_CAPSULE | ORAL | 3 refills | Status: AC

## 2019-09-29 DIAGNOSIS — R4020 Unspecified coma: Secondary | ICD-10-CM | POA: Diagnosis not present

## 2019-09-29 DIAGNOSIS — Z743 Need for continuous supervision: Secondary | ICD-10-CM | POA: Diagnosis not present

## 2019-09-29 DIAGNOSIS — I499 Cardiac arrhythmia, unspecified: Secondary | ICD-10-CM | POA: Diagnosis not present

## 2019-09-29 DIAGNOSIS — R0689 Other abnormalities of breathing: Secondary | ICD-10-CM | POA: Diagnosis not present

## 2019-09-29 DIAGNOSIS — R404 Transient alteration of awareness: Secondary | ICD-10-CM | POA: Diagnosis not present

## 2019-10-08 DEATH — deceased

## 2019-10-11 ENCOUNTER — Other Ambulatory Visit: Payer: Self-pay | Admitting: Cardiology

## 2021-09-02 IMAGING — CR DG HIP (WITH OR WITHOUT PELVIS) 2-3V*R*
3 series · 3 of 3 positions shown · non-contrast
Comparison: None.

CLINICAL DATA: Right hip pain after fall.

EXAM:
DG HIP (WITH OR WITHOUT PELVIS) 2-3V RIGHT

[pelvis ap]
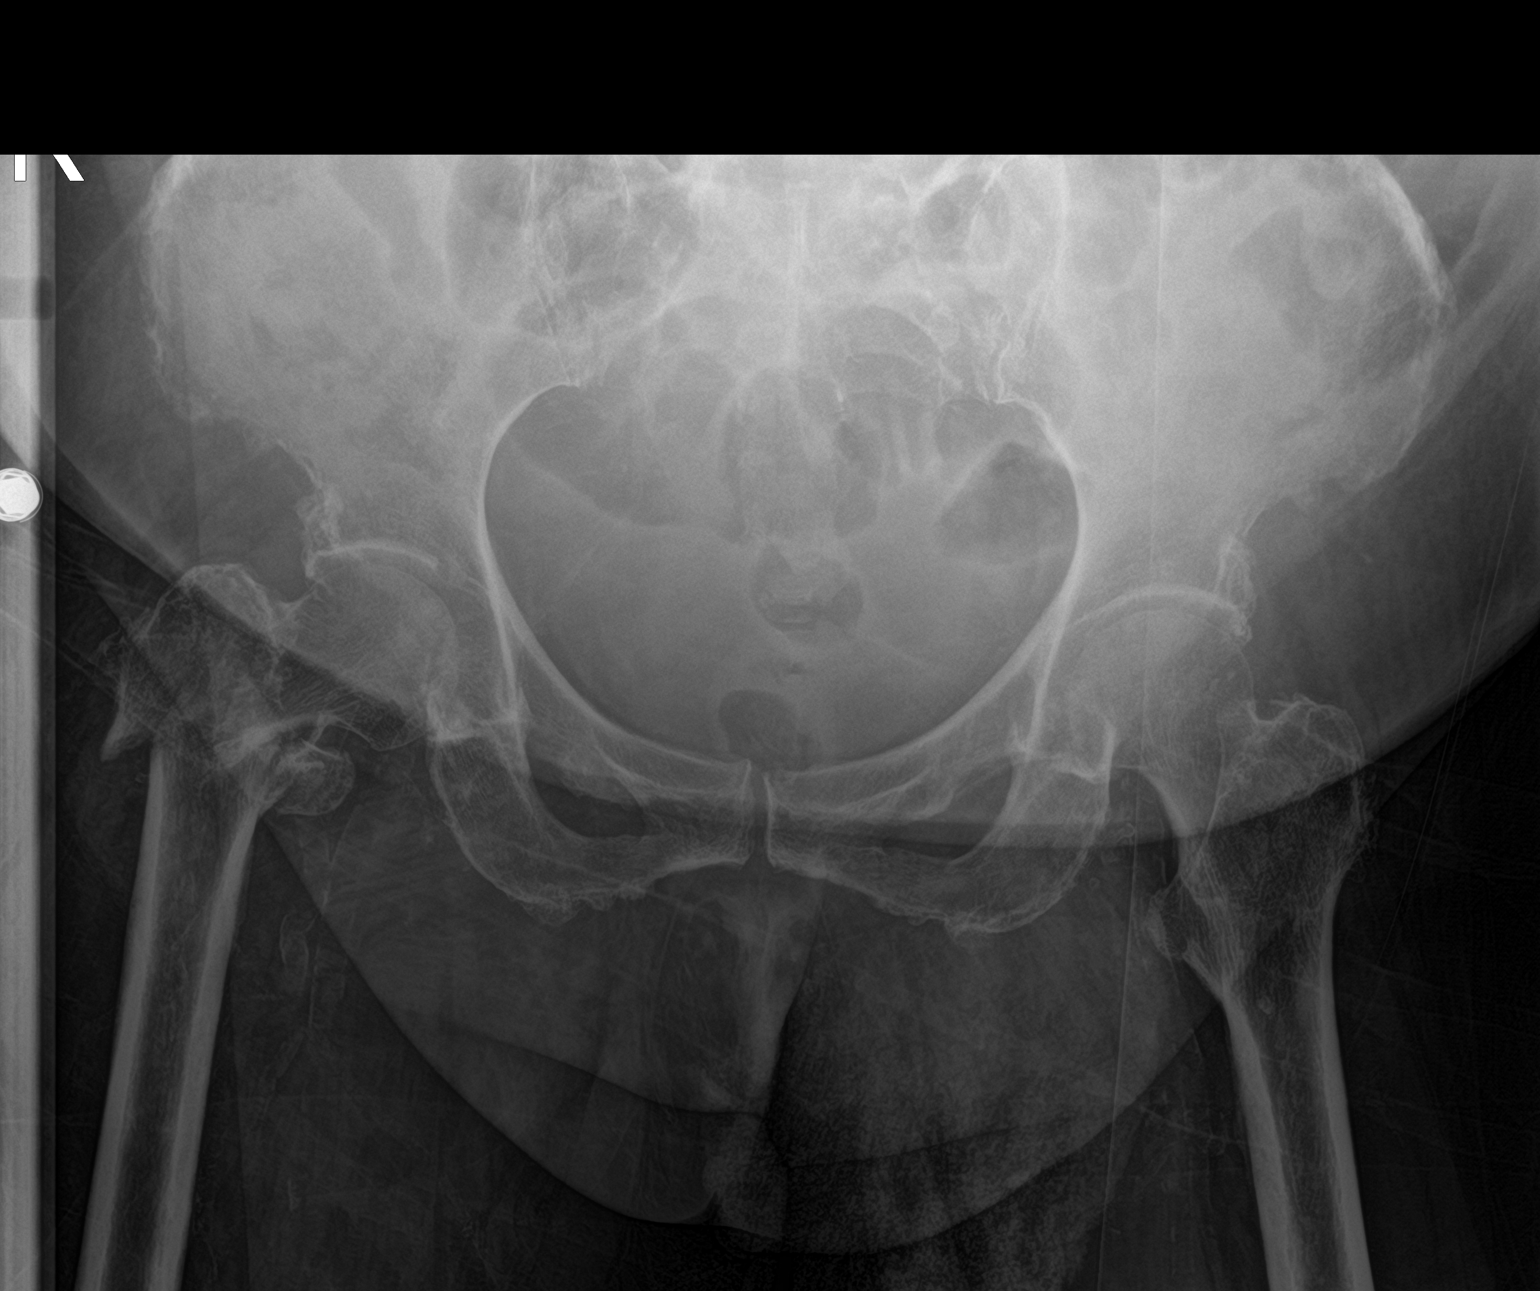

[hip ap]
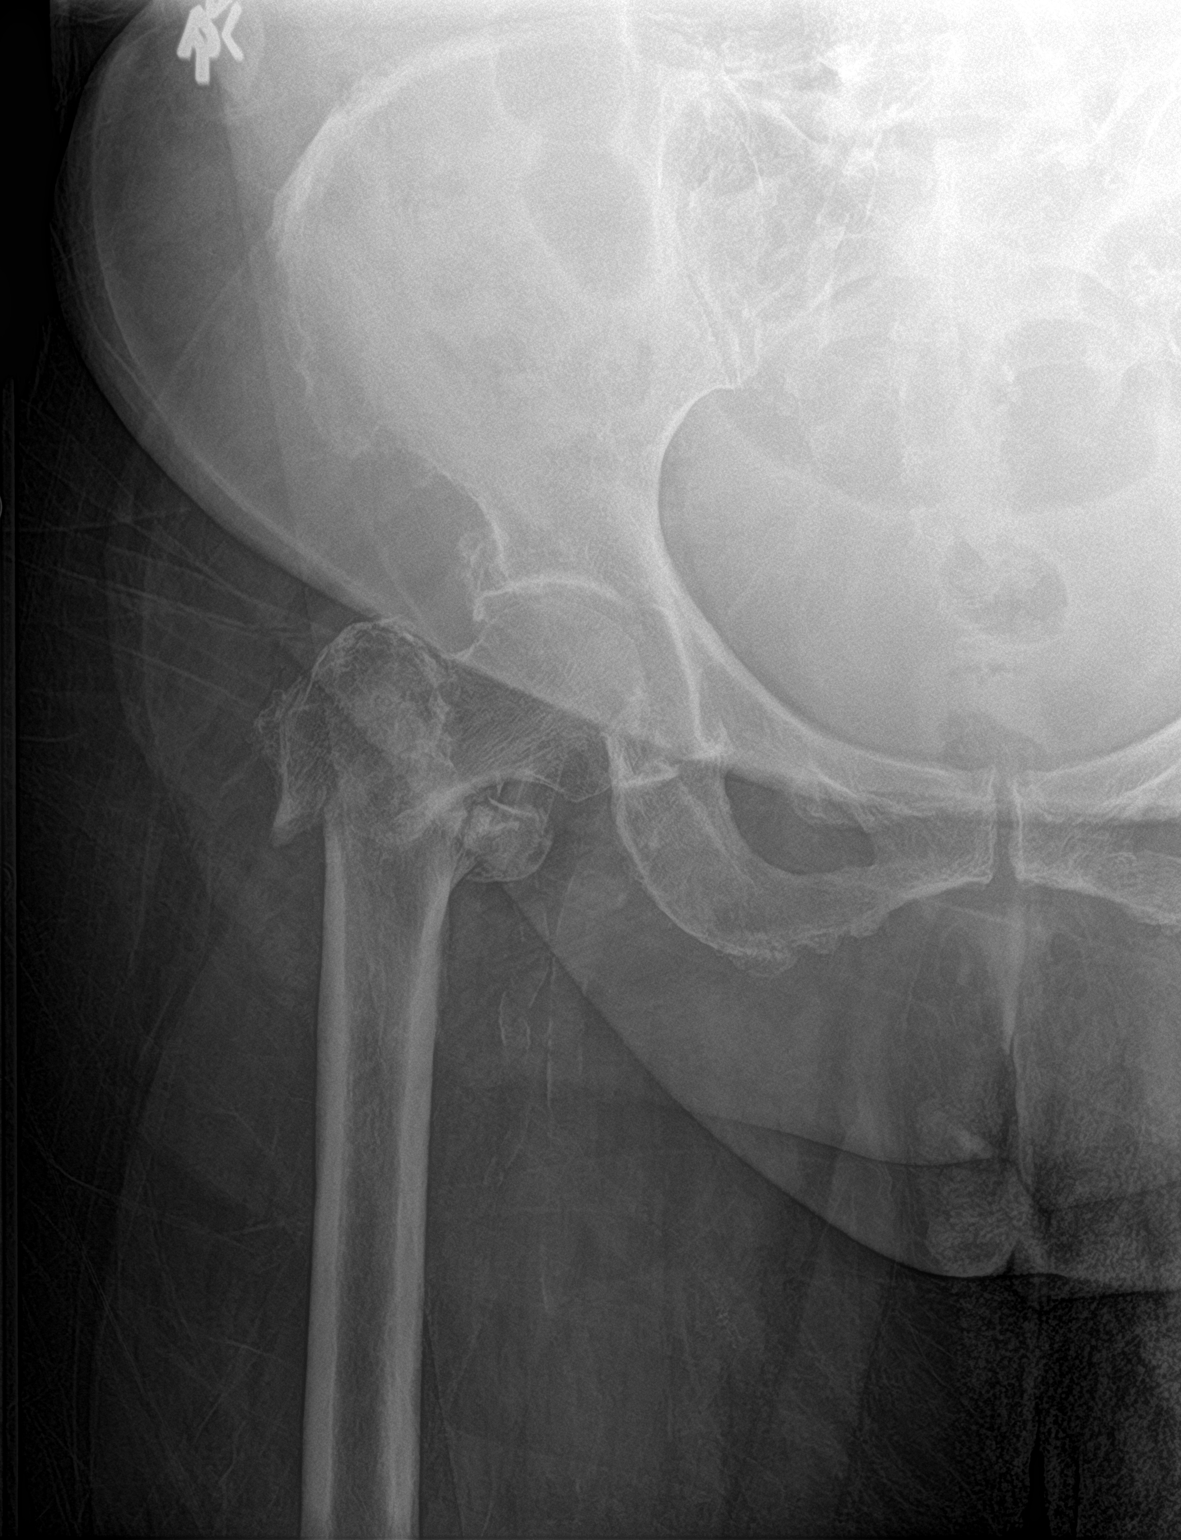

[hip x-table]
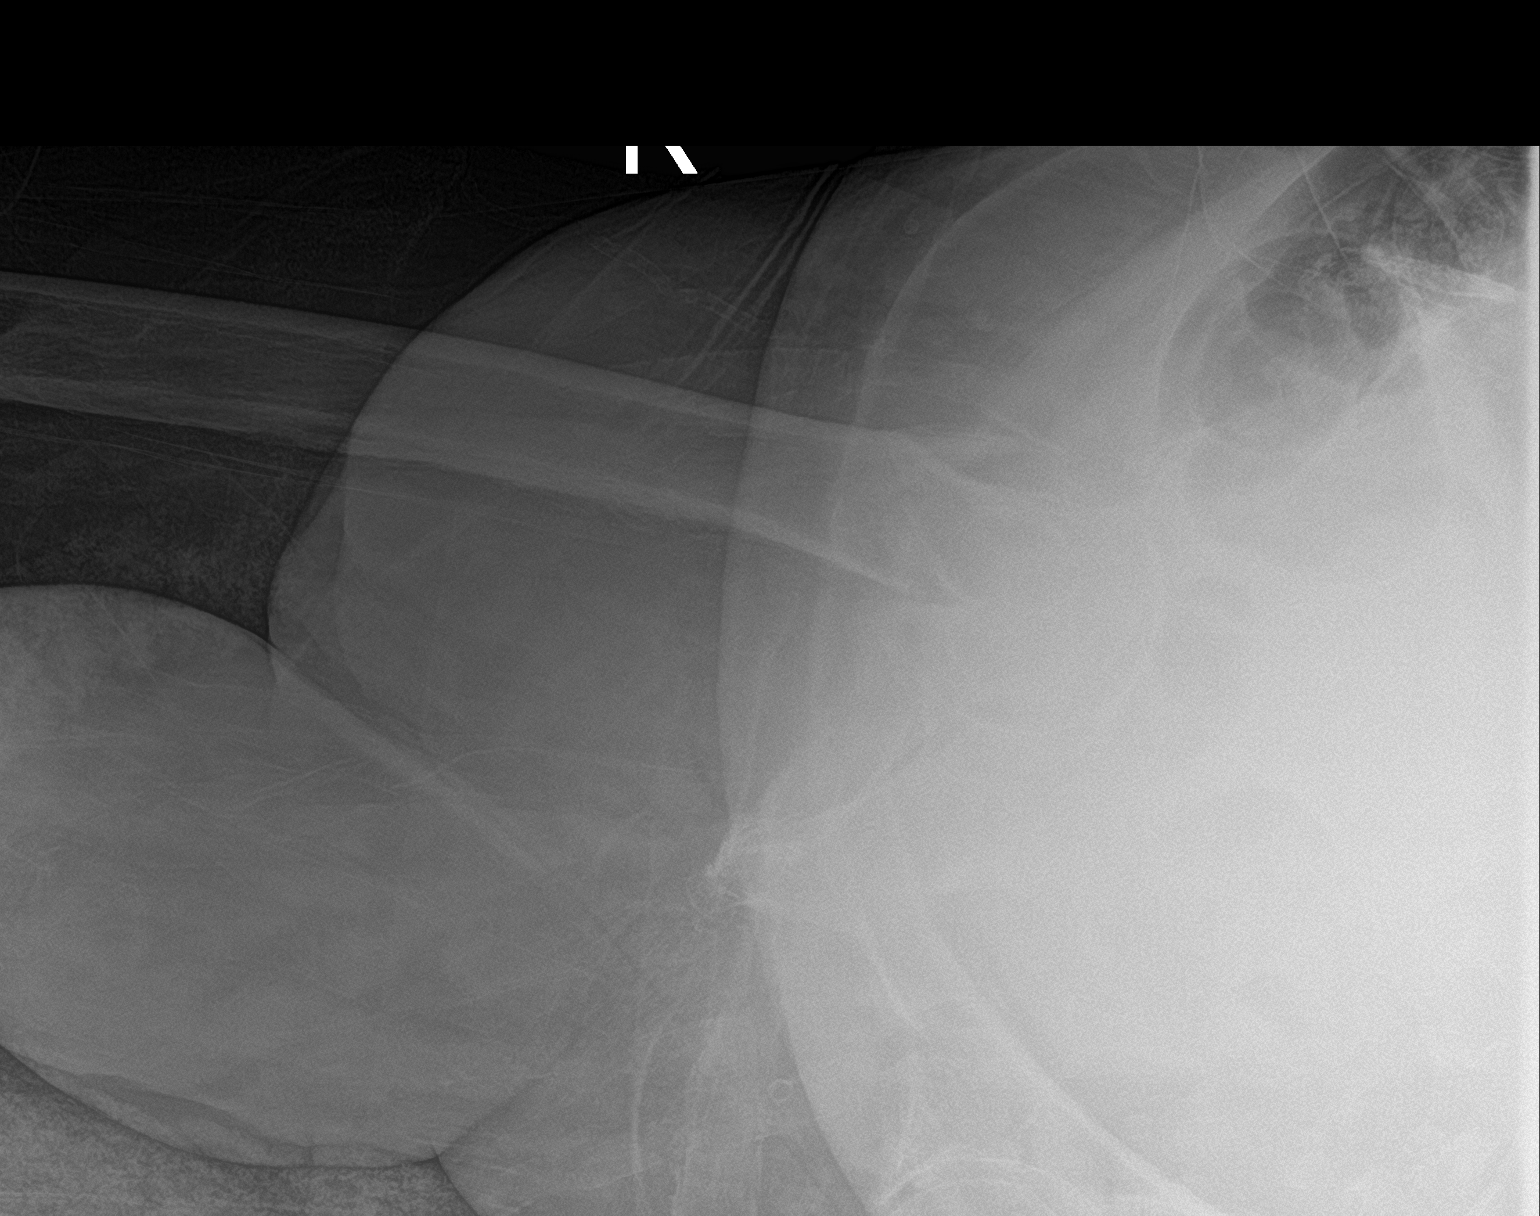

[3 of 3 positions shown; findings below may reference images not displayed]

FINDINGS: Acute impacted right intertrochanteric femur fracture with varus
angulation. No dislocation. The hip joint spaces are relatively
preserved. The pubic symphysis and sacroiliac joints are intact.
Osteopenia. Vascular calcifications.
IMPRESSION: 1. Acute impacted right intertrochanteric femur fracture.

## 2021-09-03 IMAGING — US US RENAL
1 series · 14 of 25 positions shown · non-contrast
Comparison: Renal ultrasound 03/05/2005

CLINICAL DATA: Acute kidney injury

EXAM:
RENAL / URINARY TRACT ULTRASOUND COMPLETE

[Series 1: us renal · 14 of 41 slices shown]
[im 1/41]
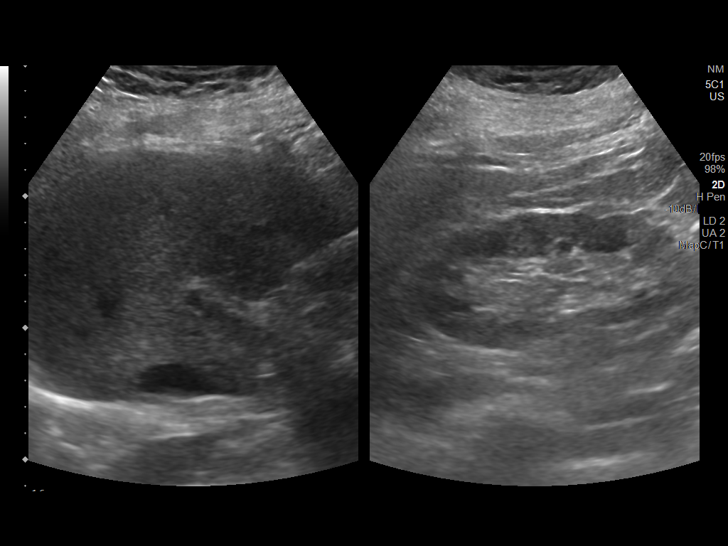
[im 4/41]
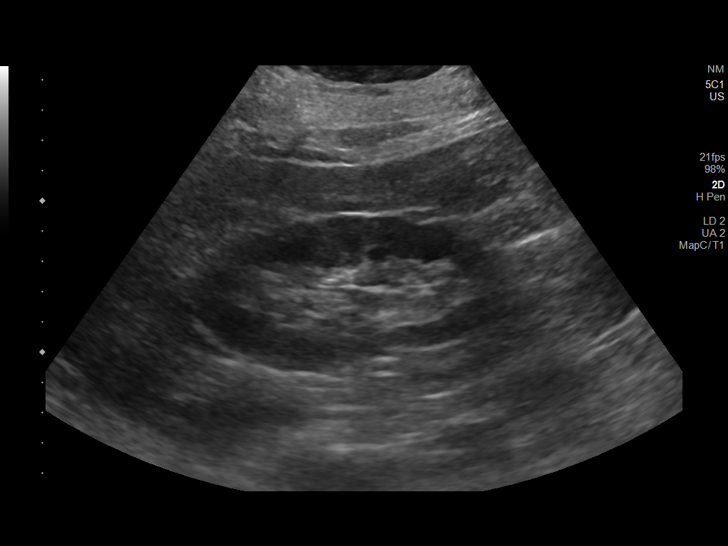
[im 7/41]
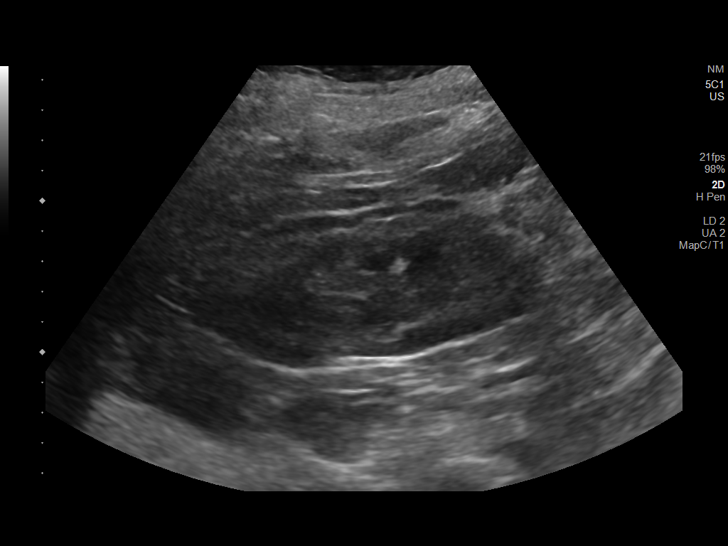
[im 11/41]
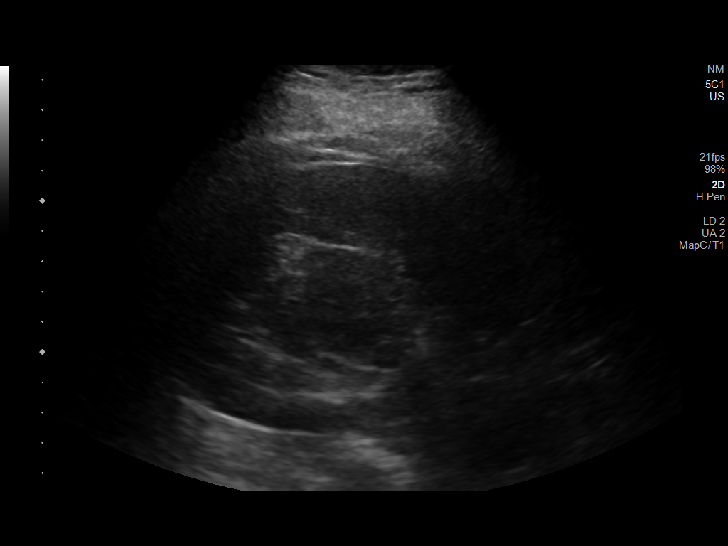
[im 14/41]
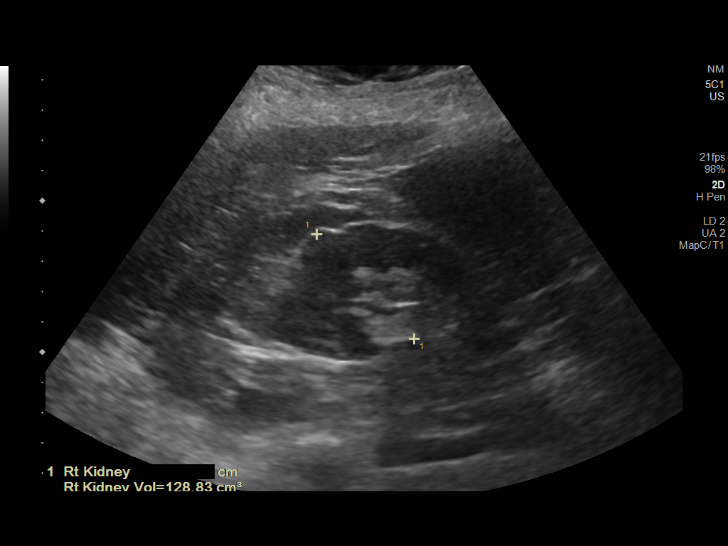
[im 16/41]
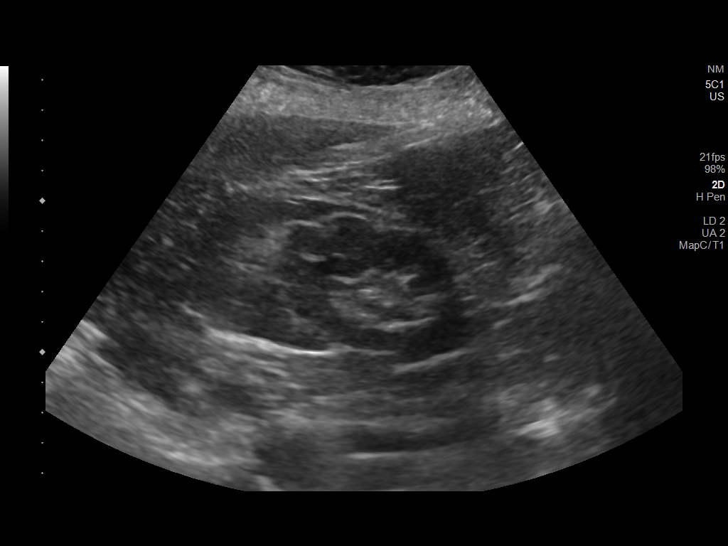
[im 19/41]
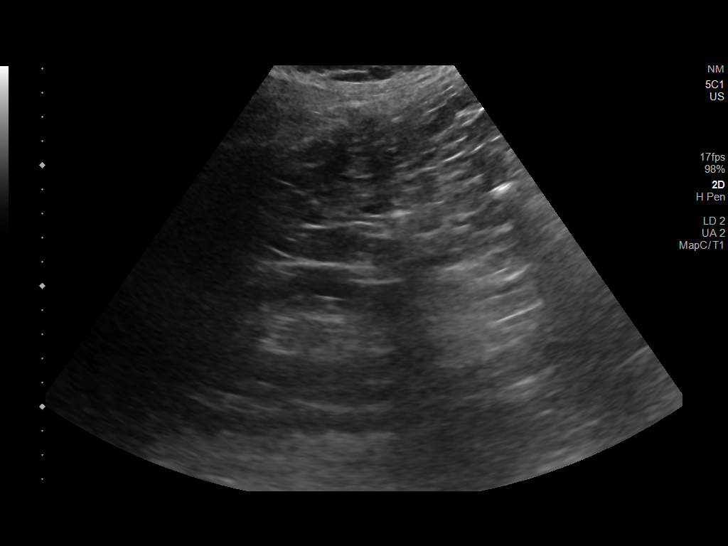
[im 22/41]
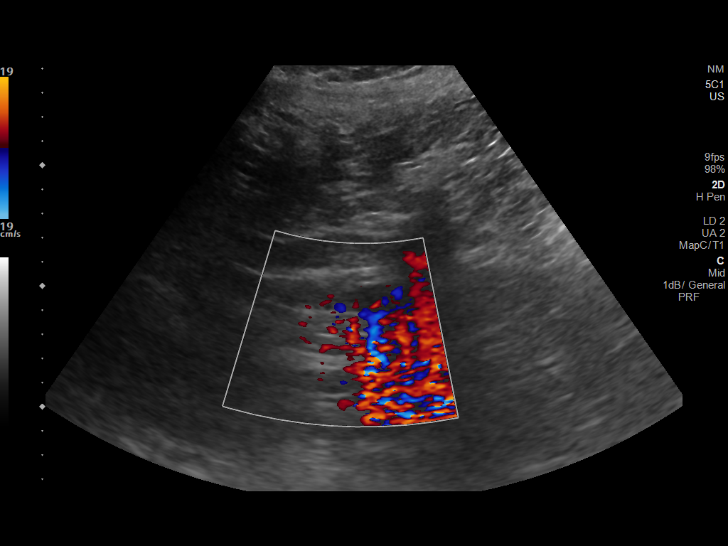
[im 26/41]
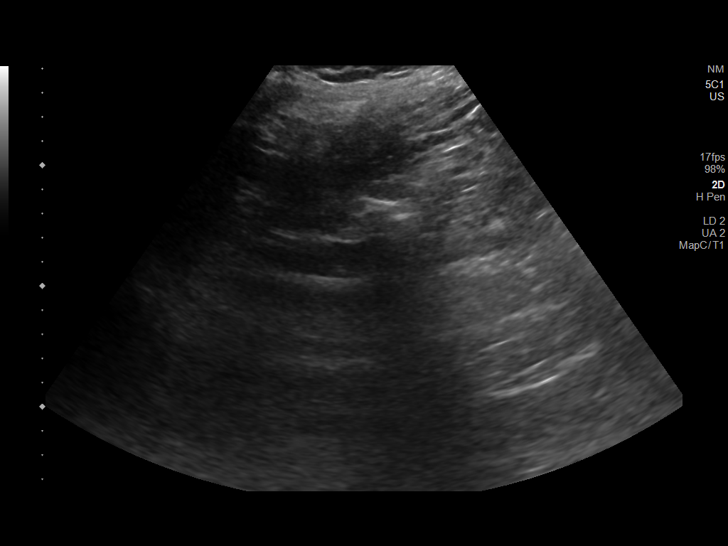
[im 27/41]
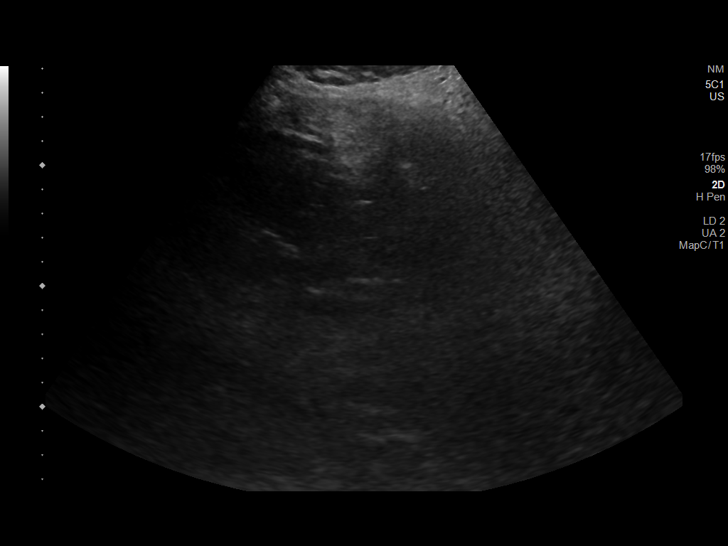
[im 31/41]
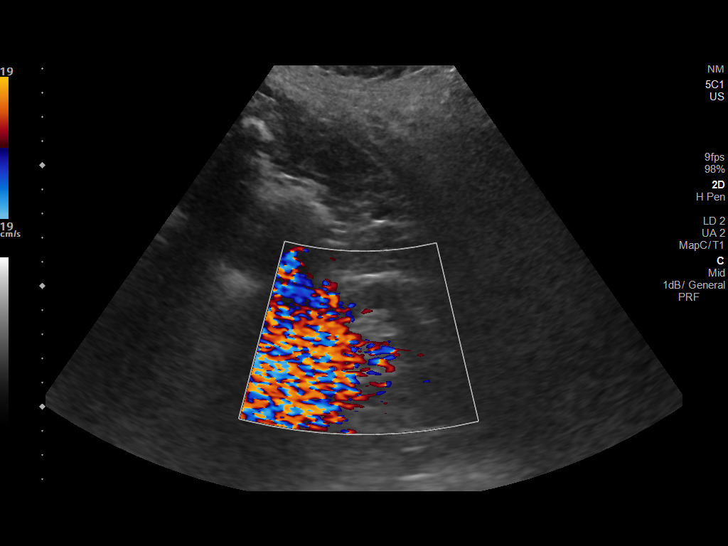
[im 34/41]
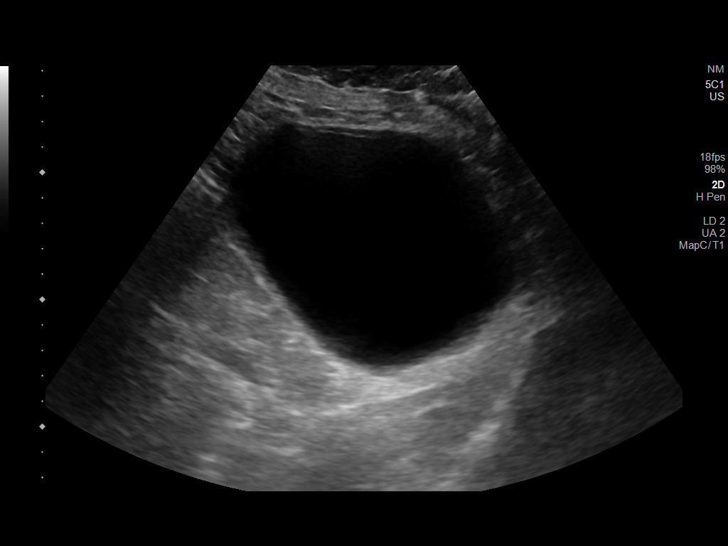
[im 37/41]
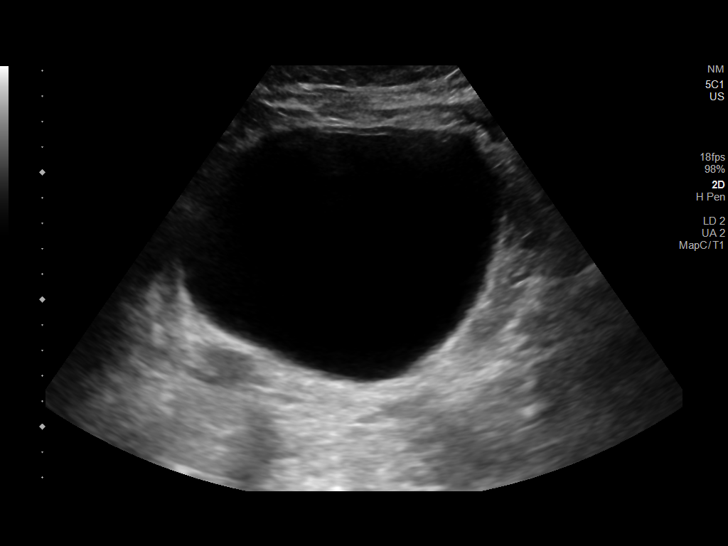
[im 41/41]
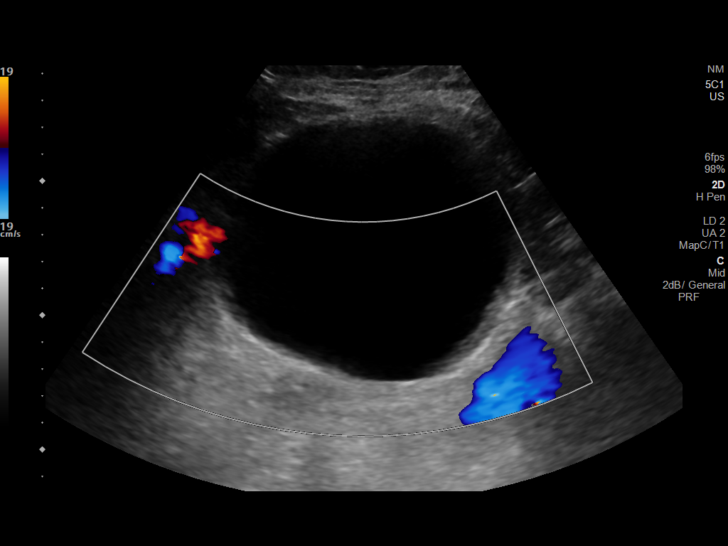

[14 of 25 positions shown; findings below may reference images not displayed]

FINDINGS: Right Kidney:

Renal measurements: 10.5 x 5.0 x 4.7 cm = volume: 128.9 mL .
Echogenicity within normal limits. No mass or hydronephrosis
visualized.

Left Kidney:

Renal measurements: 10.3 x 5.0 x 6.7 cm = volume: 179.4 mL.
Echogenicity within normal limits. No mass or hydronephrosis
visualized.

Bladder:

Appears normal for degree of bladder distention.

Other:

None.
IMPRESSION: Unremarkable renal ultrasound.

## 2021-09-03 IMAGING — DX DG SHOULDER 2+V PORT*R*
1 series · 3 of 3 positions shown · non-contrast
Comparison: None.

CLINICAL DATA: Patient with right shoulder pain.  Limited mobility.

EXAM:
PORTABLE RIGHT SHOULDER

[Series 1: shoulder · 0.14mm/px · 3 of 3 slices shown]
[im 1/3]
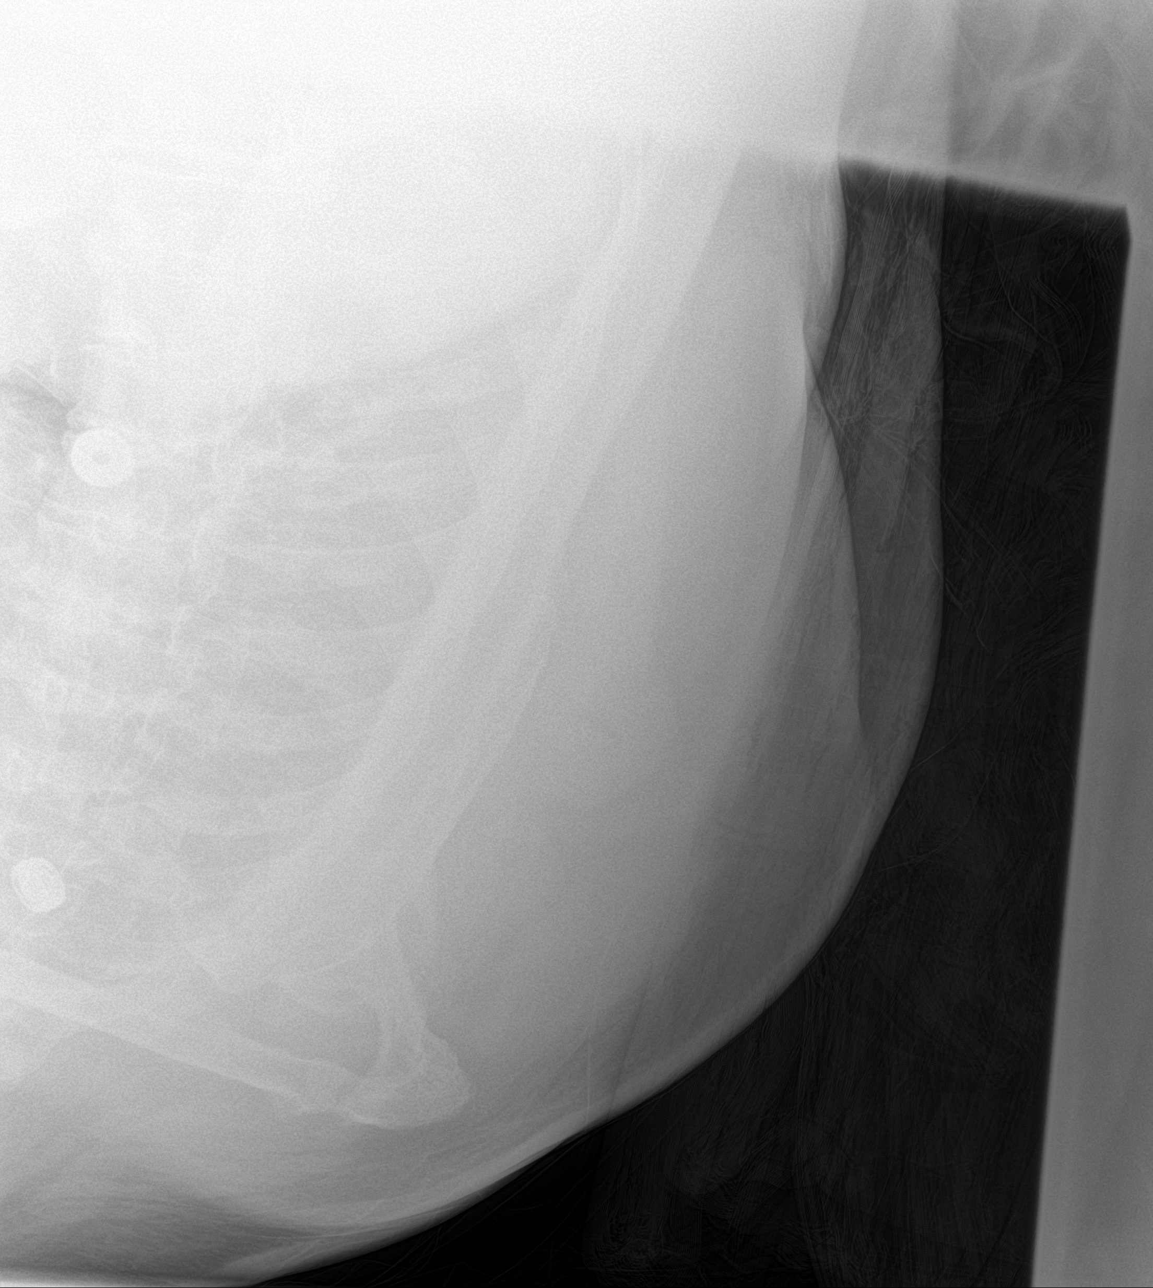
[im 2/3]
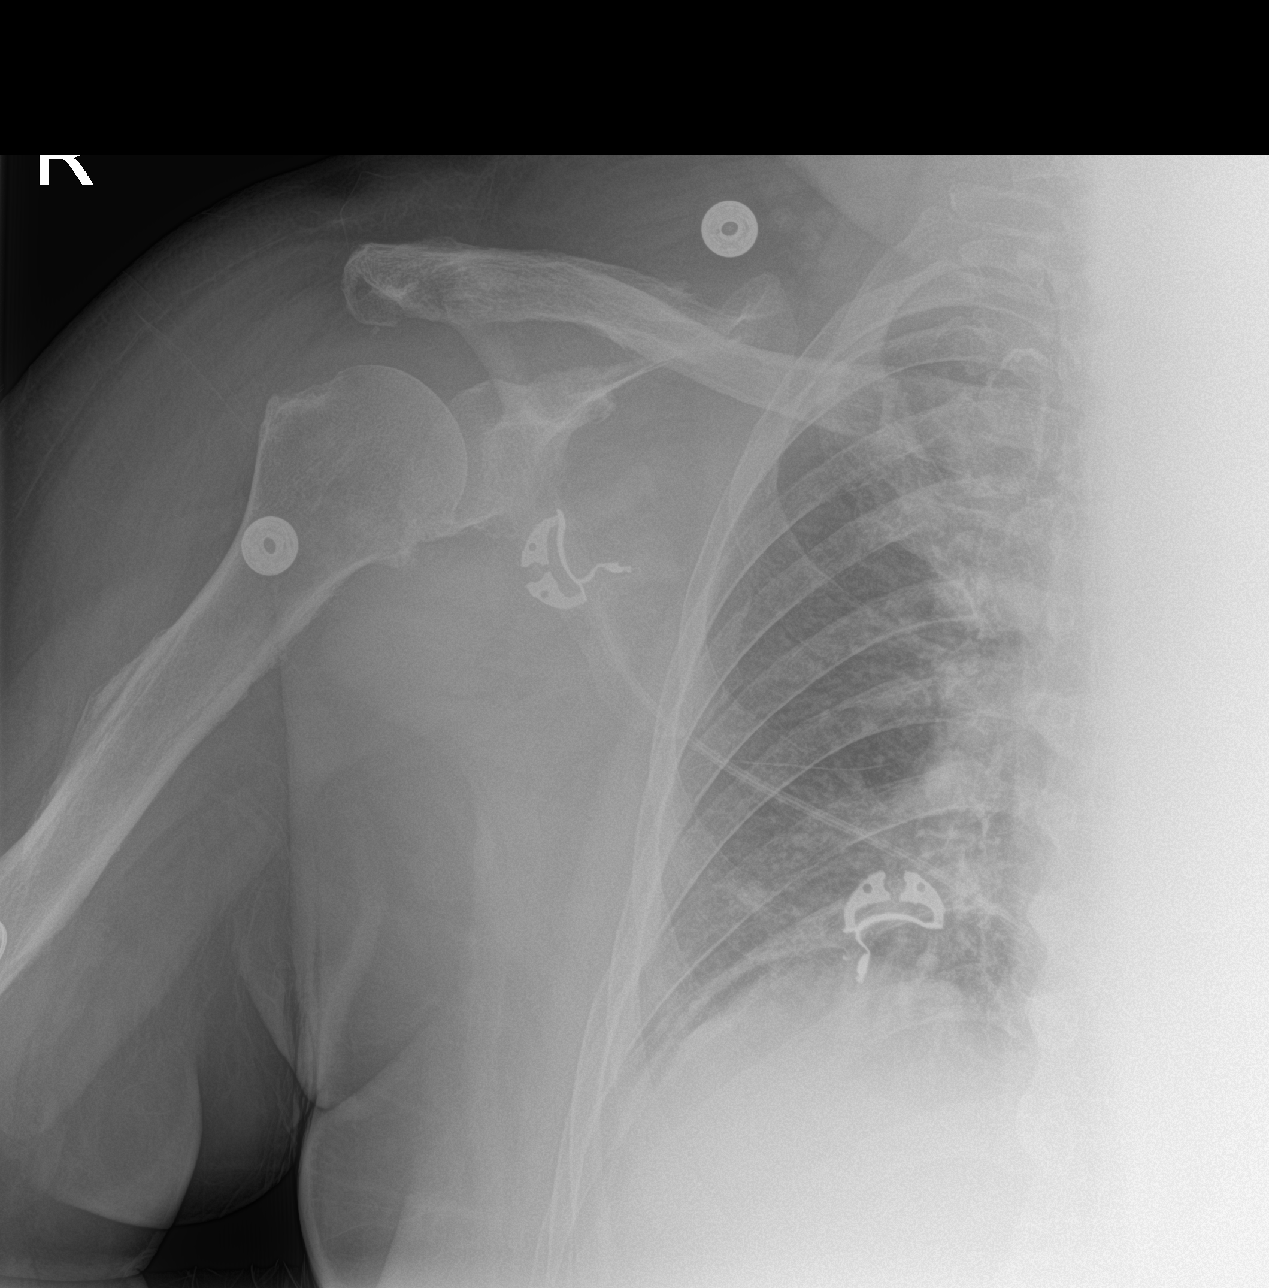
[im 3/3]
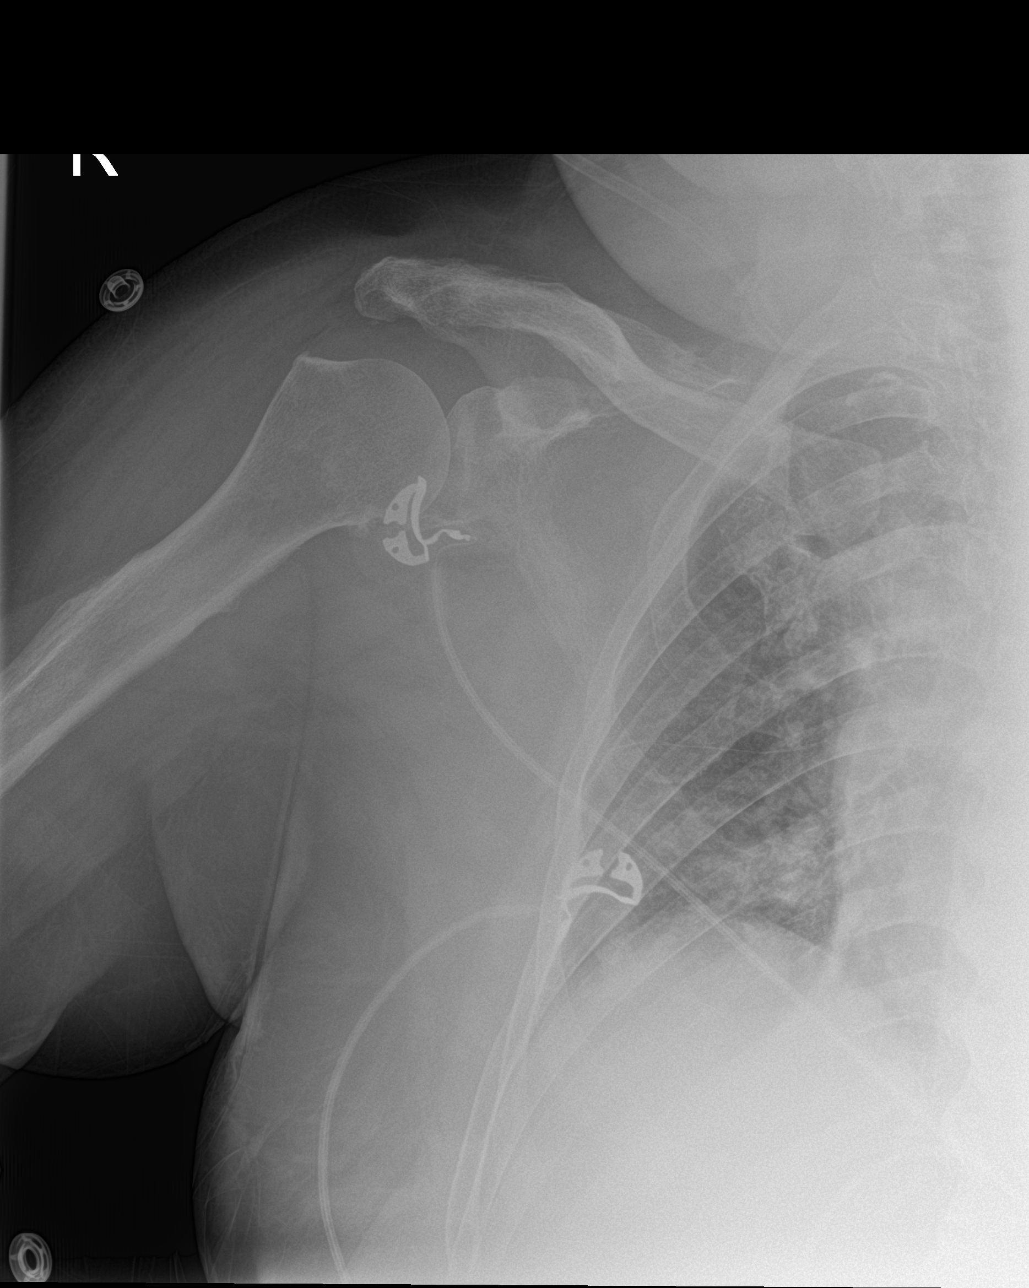

[3 of 3 positions shown; findings below may reference images not displayed]

FINDINGS: Right shoulder joint degenerative changes. No evidence for acute
fracture or dislocation. AC joint degenerative changes. Visualized
right hemithorax is unremarkable.
IMPRESSION: No acute osseous abnormality.  Degenerative changes.

## 2021-09-05 IMAGING — DX DG FEMUR 2+V PORT*R*
1 series · 4 of 4 positions shown · non-contrast
Comparison: Right hip radiograph dated 02/14/2019

CLINICAL DATA: 84-year-old female with right intertrochanteric
fracture status post ORIF.

EXAM:
RIGHT FEMUR PORTABLE 2 VIEW

[Series 1: femur · 0.14mm/px · 4 of 4 slices shown]
[im 1/4]
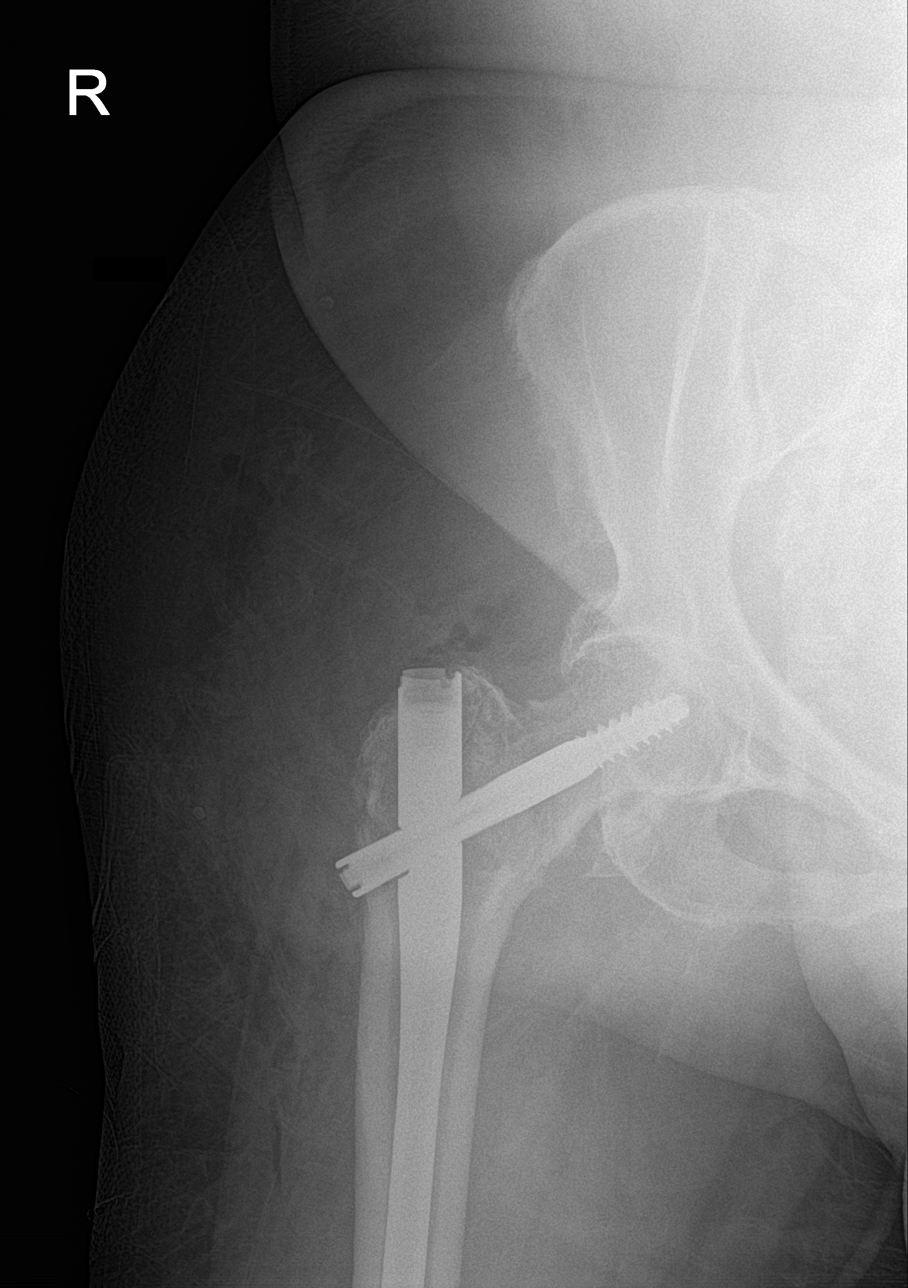
[im 2/4]
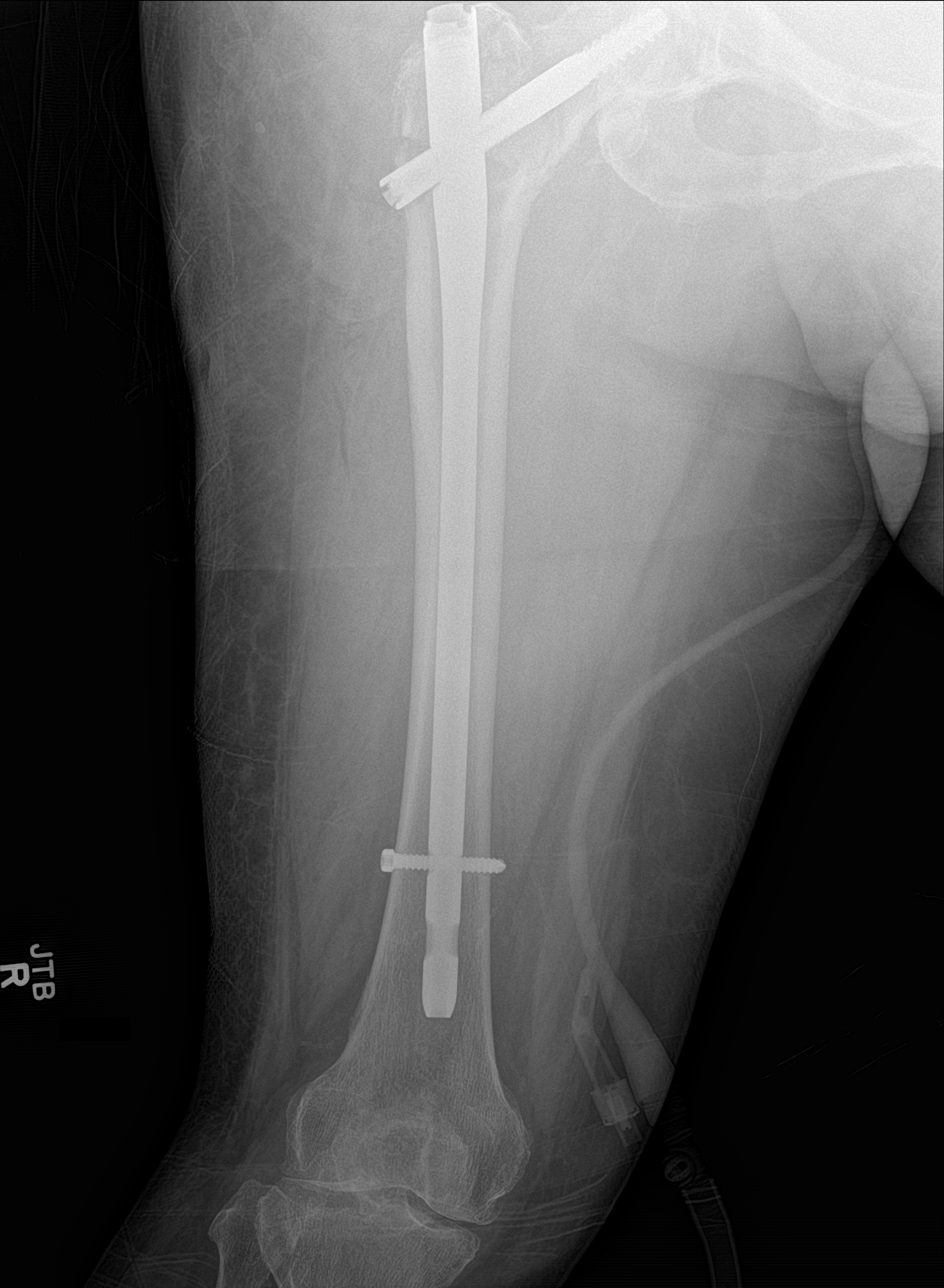
[im 3/4]
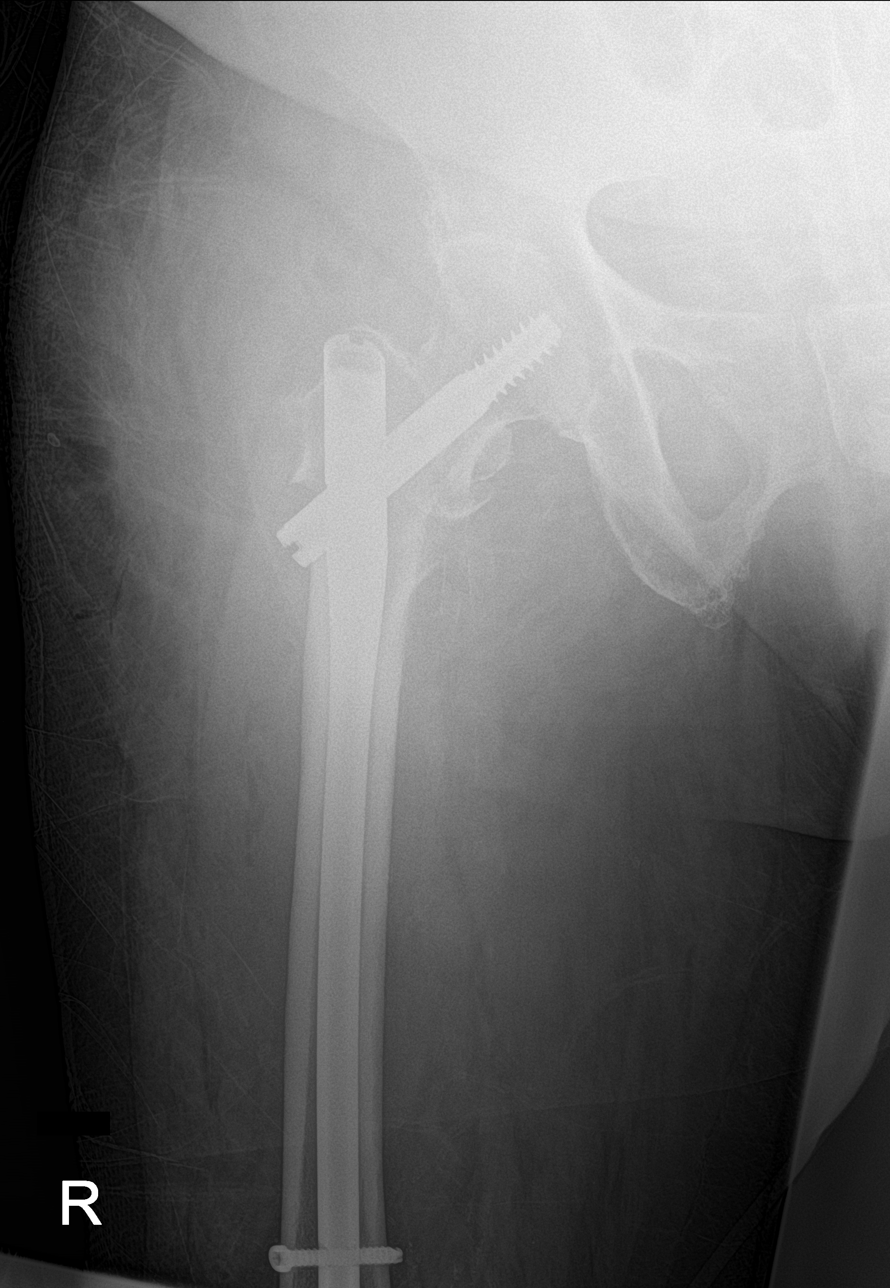
[im 4/4]
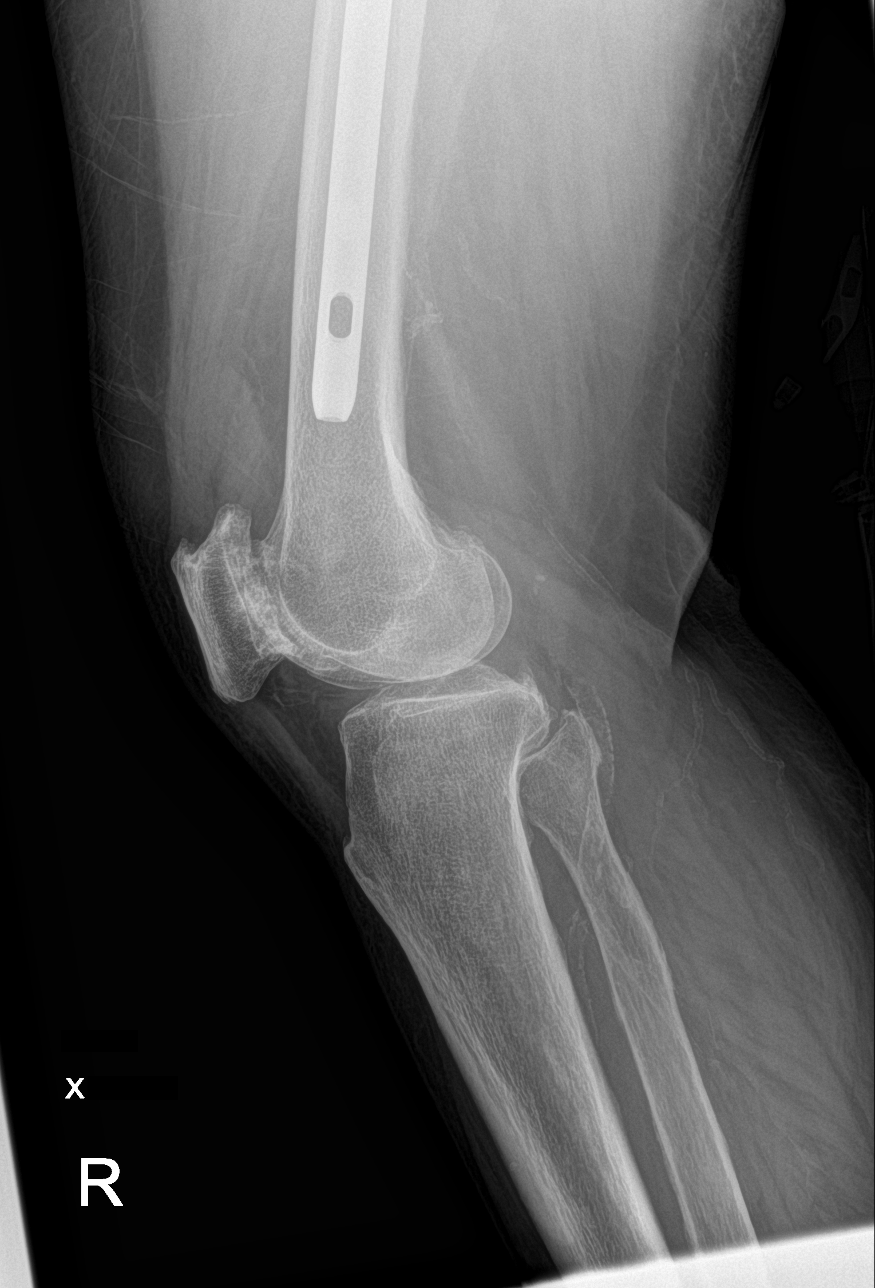

[4 of 4 positions shown; findings below may reference images not displayed]

FINDINGS: Status post ORIF of the right femoral fracture with an
intramedullary rod and transcervical screw. The major fracture
fragment appear in anatomic alignment. The orthopedic hardware
appears intact. There is a mildly displaced fracture fragment of the
lesser trochanter. There is no dislocation. There is moderate
arthritic changes of the right hip. Moderate to severe arthritic
changes of the noted. Probable small suprapatellar effusion.
Vascular calcifications. The soft tissues are grossly unremarkable.
IMPRESSION: Status post ORIF of the right femoral fracture. No needed
complication.
